# Patient Record
Sex: Female | Born: 1945
Health system: Southern US, Community
[De-identification: ages and names within clinical notes are randomized; demographics above are authoritative.]

## PROBLEM LIST (undated history)

## (undated) ENCOUNTER — Telehealth

## (undated) ENCOUNTER — Encounter

## (undated) ENCOUNTER — Telehealth
Attending: Pharmacist Clinician (PhC)/ Clinical Pharmacy Specialist | Primary: Pharmacist Clinician (PhC)/ Clinical Pharmacy Specialist

## (undated) ENCOUNTER — Encounter: Attending: Gastroenterology | Primary: Gastroenterology

## (undated) ENCOUNTER — Ambulatory Visit: Payer: MEDICARE

## (undated) ENCOUNTER — Encounter: Payer: MEDICARE | Attending: Gastroenterology | Primary: Gastroenterology

## (undated) ENCOUNTER — Ambulatory Visit: Payer: MEDICARE | Attending: Gastroenterology | Primary: Gastroenterology

## (undated) ENCOUNTER — Ambulatory Visit: Attending: Family Medicine | Primary: Family Medicine

## (undated) ENCOUNTER — Encounter
Attending: Pharmacist Clinician (PhC)/ Clinical Pharmacy Specialist | Primary: Pharmacist Clinician (PhC)/ Clinical Pharmacy Specialist

## (undated) ENCOUNTER — Ambulatory Visit

## (undated) DIAGNOSIS — R05 Cough: Secondary | ICD-10-CM

## (undated) DIAGNOSIS — R053 Chronic cough: Secondary | ICD-10-CM

## (undated) DIAGNOSIS — I10 Essential (primary) hypertension: Secondary | ICD-10-CM

## (undated) DIAGNOSIS — K219 Gastro-esophageal reflux disease without esophagitis: Secondary | ICD-10-CM

## (undated) DIAGNOSIS — M5030 Other cervical disc degeneration, unspecified cervical region: Secondary | ICD-10-CM

## (undated) DIAGNOSIS — R519 Headache, unspecified: Secondary | ICD-10-CM

## (undated) DIAGNOSIS — I071 Rheumatic tricuspid insufficiency: Secondary | ICD-10-CM

## (undated) DIAGNOSIS — R51 Headache: Secondary | ICD-10-CM

## (undated) DIAGNOSIS — K7460 Unspecified cirrhosis of liver: Secondary | ICD-10-CM

## (undated) DIAGNOSIS — F419 Anxiety disorder, unspecified: Secondary | ICD-10-CM

## (undated) DIAGNOSIS — F32A Depression, unspecified: Secondary | ICD-10-CM

## (undated) DIAGNOSIS — F329 Major depressive disorder, single episode, unspecified: Secondary | ICD-10-CM

## (undated) DIAGNOSIS — D649 Anemia, unspecified: Secondary | ICD-10-CM

## (undated) DIAGNOSIS — I219 Acute myocardial infarction, unspecified: Secondary | ICD-10-CM

## (undated) DIAGNOSIS — I779 Disorder of arteries and arterioles, unspecified: Secondary | ICD-10-CM

## (undated) DIAGNOSIS — IMO0001 Reserved for inherently not codable concepts without codable children: Secondary | ICD-10-CM

## (undated) DIAGNOSIS — T7840XA Allergy, unspecified, initial encounter: Secondary | ICD-10-CM

## (undated) DIAGNOSIS — I2510 Atherosclerotic heart disease of native coronary artery without angina pectoris: Secondary | ICD-10-CM

## (undated) DIAGNOSIS — I739 Peripheral vascular disease, unspecified: Secondary | ICD-10-CM

## (undated) HISTORY — PX: EYE SURGERY: SHX253

## (undated) HISTORY — PX: OTHER SURGICAL HISTORY: SHX169

## (undated) HISTORY — PX: BREAST CYST ASPIRATION: SHX578

## (undated) HISTORY — DX: Allergy, unspecified, initial encounter: T78.40XA

## (undated) HISTORY — PX: ABDOMINAL HYSTERECTOMY: SHX81

## (undated) HISTORY — PX: CARDIAC CATHETERIZATION: SHX172

## (undated) MED ORDER — ALENDRONATE 70 MG TABLET: ORAL | 0 days

---

## 2011-12-04 DIAGNOSIS — I214 Non-ST elevation (NSTEMI) myocardial infarction: Secondary | ICD-10-CM | POA: Insufficient documentation

## 2011-12-04 DIAGNOSIS — R739 Hyperglycemia, unspecified: Secondary | ICD-10-CM | POA: Insufficient documentation

## 2011-12-08 MED ORDER — NITROGLYCERIN 0.4 MG SUBLINGUAL TABLET
SUBLINGUAL | 0.00000 days | PRN
Start: 2011-12-08 — End: ?

## 2011-12-08 MED ORDER — FAMOTIDINE 20 MG TABLET
ORAL | 0.00000 days
Start: 2011-12-08 — End: ?

## 2012-02-04 ENCOUNTER — Emergency Department: Payer: Self-pay

## 2012-02-04 LAB — CK TOTAL AND CKMB (NOT AT ARMC)
CK, Total: 71 U/L (ref 21–215)
CK-MB: 0.7 ng/mL (ref 0.5–3.6)

## 2012-02-04 LAB — TROPONIN I: Troponin-I: <0.02 ng/mL

## 2012-02-04 LAB — BASIC METABOLIC PANEL
BUN: 9 mg/dL (ref 7–18)
Calcium, Total: 8.6 mg/dL (ref 8.5–10.1)
Co2: 25 mmol/L (ref 21–32)
Sodium: 139 mmol/L (ref 136–145)

## 2012-02-04 LAB — CBC
HCT: 29.8 % — ABNORMAL LOW (ref 35.0–47.0)
HGB: 10.3 g/dL — ABNORMAL LOW (ref 12.0–16.0)
MCH: 29.2 pg (ref 26.0–34.0)
MCHC: 34.7 g/dL (ref 32.0–36.0)
MCV: 84 fL (ref 80–100)
Platelet: 157 10*3/uL (ref 150–440)
RDW: 14.3 % (ref 11.5–14.5)

## 2012-03-04 ENCOUNTER — Ambulatory Visit: Payer: Self-pay | Admitting: Internal Medicine

## 2013-04-11 ENCOUNTER — Emergency Department: Payer: Self-pay | Admitting: Emergency Medicine

## 2013-04-11 LAB — CBC
HCT: 33.8 % — ABNORMAL LOW (ref 35.0–47.0)
HGB: 11.1 g/dL — AB (ref 12.0–16.0)
MCH: 27.6 pg (ref 26.0–34.0)
MCHC: 32.8 g/dL (ref 32.0–36.0)
MCV: 84 fL (ref 80–100)
Platelet: 139 10*3/uL — ABNORMAL LOW (ref 150–440)
RBC: 4.02 10*6/uL (ref 3.80–5.20)
RDW: 14.4 % (ref 11.5–14.5)
WBC: 8.6 10*3/uL (ref 3.6–11.0)

## 2013-04-11 LAB — BASIC METABOLIC PANEL
Anion Gap: 6 — ABNORMAL LOW (ref 7–16)
BUN: 6 mg/dL — ABNORMAL LOW (ref 7–18)
CALCIUM: 8.6 mg/dL (ref 8.5–10.1)
Chloride: 110 mmol/L — ABNORMAL HIGH (ref 98–107)
Co2: 23 mmol/L (ref 21–32)
Creatinine: 0.69 mg/dL (ref 0.60–1.30)
EGFR (African American): >60
EGFR (Non-African Amer.): >60
Glucose: 93 mg/dL (ref 65–99)
Osmolality: 275 (ref 275–301)
Potassium: 3.5 mmol/L (ref 3.5–5.1)
Sodium: 139 mmol/L (ref 136–145)

## 2013-04-11 LAB — TROPONIN I: Troponin-I: <0.02 ng/mL

## 2013-05-08 ENCOUNTER — Ambulatory Visit: Payer: Self-pay | Admitting: Orthopedic Surgery

## 2013-05-20 DIAGNOSIS — I2510 Atherosclerotic heart disease of native coronary artery without angina pectoris: Secondary | ICD-10-CM | POA: Insufficient documentation

## 2013-05-20 DIAGNOSIS — I10 Essential (primary) hypertension: Secondary | ICD-10-CM | POA: Insufficient documentation

## 2013-05-20 DIAGNOSIS — E782 Mixed hyperlipidemia: Secondary | ICD-10-CM | POA: Insufficient documentation

## 2013-06-17 ENCOUNTER — Ambulatory Visit: Payer: Self-pay | Admitting: Internal Medicine

## 2013-06-25 ENCOUNTER — Ambulatory Visit: Payer: Self-pay | Admitting: Internal Medicine

## 2013-06-25 LAB — CANCER CENTER HEMOGLOBIN: HGB: 7.9 g/dL — ABNORMAL LOW (ref 12.0–16.0)

## 2013-07-02 LAB — CANCER CENTER HEMOGLOBIN: HGB: 8.6 g/dL — AB (ref 12.0–16.0)

## 2013-07-03 ENCOUNTER — Ambulatory Visit: Payer: Self-pay | Admitting: Internal Medicine

## 2013-07-08 LAB — CANCER CENTER HEMOGLOBIN: HGB: 8.7 g/dL — AB (ref 12.0–16.0)

## 2013-07-10 ENCOUNTER — Ambulatory Visit: Payer: Self-pay | Admitting: Gastroenterology

## 2013-07-13 LAB — PATHOLOGY REPORT

## 2013-07-20 LAB — CBC CANCER CENTER
BASOS ABS: 0 x10 3/mm (ref 0.0–0.1)
Basophil %: 0.5 %
Eosinophil #: 0.2 x10 3/mm (ref 0.0–0.7)
Eosinophil %: 2.6 %
HCT: 29.2 % — ABNORMAL LOW (ref 35.0–47.0)
HGB: 9.2 g/dL — ABNORMAL LOW (ref 12.0–16.0)
LYMPHS ABS: 2.7 x10 3/mm (ref 1.0–3.6)
Lymphocyte %: 41.4 %
MCH: 24.8 pg — AB (ref 26.0–34.0)
MCHC: 31.6 g/dL — AB (ref 32.0–36.0)
MCV: 79 fL — ABNORMAL LOW (ref 80–100)
MONO ABS: 0.5 x10 3/mm (ref 0.2–0.9)
Monocyte %: 8.2 %
Neutrophil #: 3 x10 3/mm (ref 1.4–6.5)
Neutrophil %: 47.3 %
PLATELETS: 138 x10 3/mm — AB (ref 150–440)
RBC: 3.72 10*6/uL — AB (ref 3.80–5.20)
RDW: 22.7 % — ABNORMAL HIGH (ref 11.5–14.5)
WBC: 6.4 x10 3/mm (ref 3.6–11.0)

## 2013-08-03 ENCOUNTER — Ambulatory Visit: Payer: Self-pay | Admitting: Internal Medicine

## 2013-09-18 ENCOUNTER — Ambulatory Visit: Payer: Self-pay | Admitting: Internal Medicine

## 2013-11-04 DIAGNOSIS — M5412 Radiculopathy, cervical region: Secondary | ICD-10-CM | POA: Insufficient documentation

## 2013-11-04 DIAGNOSIS — M4310 Spondylolisthesis, site unspecified: Secondary | ICD-10-CM | POA: Insufficient documentation

## 2013-11-04 DIAGNOSIS — M5030 Other cervical disc degeneration, unspecified cervical region: Secondary | ICD-10-CM | POA: Insufficient documentation

## 2013-12-07 ENCOUNTER — Ambulatory Visit: Payer: Self-pay | Admitting: Internal Medicine

## 2013-12-07 LAB — CBC CANCER CENTER
Basophil #: 0 x10 3/mm (ref 0.0–0.1)
Basophil: 2 %
EOS ABS: 0.3 x10 3/mm (ref 0.0–0.7)
Eosinophil: 3 %
HCT: 41.6 % (ref 35.0–47.0)
HGB: 13.2 g/dL (ref 12.0–16.0)
Lymphocytes: 34 %
MCH: 29.4 pg (ref 26.0–34.0)
MCHC: 31.9 g/dL — ABNORMAL LOW (ref 32.0–36.0)
MCV: 92 fL (ref 80–100)
Monocytes: 10 %
Platelet: 129 x10 3/mm — ABNORMAL LOW (ref 150–440)
RBC: 4.51 10*6/uL (ref 3.80–5.20)
RDW: 15.3 % — ABNORMAL HIGH (ref 11.5–14.5)
SEGMENTED NEUTROPHILS: 51 %
WBC: 11.6 x10 3/mm — ABNORMAL HIGH (ref 3.6–11.0)

## 2013-12-07 LAB — PROTIME-INR
INR: 1.1
Prothrombin Time: 13.6 secs (ref 11.5–14.7)

## 2013-12-07 LAB — RETICULOCYTES
Absolute Retic Count: 0.0888 10*6/uL (ref 0.019–0.186)
Reticulocyte: 1.97 % (ref 0.4–3.1)

## 2013-12-07 LAB — FOLATE: Folic Acid: 43.2 ng/mL (ref 3.1–100.0)

## 2013-12-07 LAB — IRON AND TIBC
IRON SATURATION: 23 %
Iron Bind.Cap.(Total): 379 ug/dL (ref 250–450)
Iron: 88 ug/dL (ref 50–170)
UNBOUND IRON-BIND. CAP.: 291 ug/dL

## 2013-12-07 LAB — LACTATE DEHYDROGENASE: LDH: 243 U/L (ref 81–246)

## 2013-12-07 LAB — APTT: ACTIVATED PTT: 32 s (ref 23.6–35.9)

## 2013-12-07 LAB — FIBRINOGEN: Fibrinogen: 365 mg/dL (ref 210–470)

## 2013-12-07 LAB — FERRITIN: Ferritin (ARMC): 99 ng/mL (ref 8–388)

## 2013-12-07 LAB — FIBRIN DEGRADATION PROD.(ARMC ONLY): Fibrin Degradation Prod.: <10 ug/ml (ref 2.1–7.7)

## 2013-12-09 LAB — PROT IMMUNOELECTROPHORES(ARMC)

## 2013-12-09 LAB — URINE IEP, RANDOM

## 2013-12-29 ENCOUNTER — Ambulatory Visit: Payer: Self-pay | Admitting: Internal Medicine

## 2014-01-03 ENCOUNTER — Ambulatory Visit: Payer: Self-pay | Admitting: Internal Medicine

## 2014-01-08 ENCOUNTER — Ambulatory Visit: Payer: Self-pay | Admitting: Gastroenterology

## 2014-01-12 DIAGNOSIS — K7460 Unspecified cirrhosis of liver: Secondary | ICD-10-CM | POA: Insufficient documentation

## 2014-01-12 DIAGNOSIS — I6523 Occlusion and stenosis of bilateral carotid arteries: Secondary | ICD-10-CM | POA: Insufficient documentation

## 2014-01-12 DIAGNOSIS — R0600 Dyspnea, unspecified: Secondary | ICD-10-CM | POA: Insufficient documentation

## 2014-01-12 DIAGNOSIS — I6529 Occlusion and stenosis of unspecified carotid artery: Secondary | ICD-10-CM | POA: Insufficient documentation

## 2014-01-12 DIAGNOSIS — D509 Iron deficiency anemia, unspecified: Secondary | ICD-10-CM | POA: Insufficient documentation

## 2014-01-12 DIAGNOSIS — K769 Liver disease, unspecified: Secondary | ICD-10-CM | POA: Insufficient documentation

## 2014-01-12 DIAGNOSIS — R0681 Apnea, not elsewhere classified: Secondary | ICD-10-CM | POA: Insufficient documentation

## 2014-02-12 DIAGNOSIS — I071 Rheumatic tricuspid insufficiency: Secondary | ICD-10-CM | POA: Insufficient documentation

## 2014-03-09 ENCOUNTER — Ambulatory Visit: Payer: Self-pay | Admitting: Internal Medicine

## 2014-03-09 LAB — CBC CANCER CENTER
BASOS PCT: 0.5 %
Basophil #: 0 x10 3/mm (ref 0.0–0.1)
EOS ABS: 0.5 x10 3/mm (ref 0.0–0.7)
Eosinophil %: 5.7 %
HCT: 41.7 % (ref 35.0–47.0)
HGB: 13.9 g/dL (ref 12.0–16.0)
LYMPHS ABS: 3.1 x10 3/mm (ref 1.0–3.6)
Lymphocyte %: 34.2 %
MCH: 30.8 pg (ref 26.0–34.0)
MCHC: 33.4 g/dL (ref 32.0–36.0)
MCV: 92 fL (ref 80–100)
MONO ABS: 0.6 x10 3/mm (ref 0.2–0.9)
Monocyte %: 7 %
Neutrophil #: 4.8 x10 3/mm (ref 1.4–6.5)
Neutrophil %: 52.6 %
Platelet: 112 x10 3/mm — ABNORMAL LOW (ref 150–440)
RBC: 4.53 10*6/uL (ref 3.80–5.20)
RDW: 14.4 % (ref 11.5–14.5)
WBC: 9.1 x10 3/mm (ref 3.6–11.0)

## 2014-04-05 ENCOUNTER — Ambulatory Visit: Payer: Self-pay | Admitting: Internal Medicine

## 2014-04-29 ENCOUNTER — Other Ambulatory Visit: Payer: Self-pay | Admitting: Neurosurgery

## 2014-04-29 DIAGNOSIS — M5412 Radiculopathy, cervical region: Secondary | ICD-10-CM

## 2014-05-07 ENCOUNTER — Ambulatory Visit
Admission: RE | Admit: 2014-05-07 | Discharge: 2014-05-07 | Disposition: A | Discharge status: Home / Self Care | Payer: Medicare Other | Source: Ambulatory Visit | Attending: Neurosurgery | Admitting: Neurosurgery

## 2014-05-07 DIAGNOSIS — M5412 Radiculopathy, cervical region: Secondary | ICD-10-CM

## 2014-05-07 MED ORDER — IOHEXOL 300 MG/ML  SOLN
10.0000 mL | Freq: Once | INTRAMUSCULAR | Status: AC | PRN
  Administered 2014-05-07: 10 mL via INTRATHECAL

## 2014-05-07 MED ORDER — DIAZEPAM 5 MG PO TABS
5.0000 mg | ORAL_TABLET | Freq: Once | ORAL | Status: AC
Start: 2014-05-07 — End: 2014-05-07
  Administered 2014-05-07: 5 mg via ORAL

## 2014-05-07 NOTE — Discharge Instructions (Signed)

## 2014-05-14 ENCOUNTER — Ambulatory Visit: Payer: Self-pay | Admitting: Gastroenterology

## 2014-05-17 ENCOUNTER — Other Ambulatory Visit: Payer: Self-pay | Admitting: Neurosurgery

## 2014-05-21 ENCOUNTER — Encounter (HOSPITAL_COMMUNITY)
Admission: RE | Admit: 2014-05-21 | Discharge: 2014-05-21 | Disposition: A | Discharge status: Home / Self Care | Payer: Medicare Other | Source: Ambulatory Visit | Attending: Neurosurgery | Admitting: Neurosurgery

## 2014-05-21 ENCOUNTER — Encounter (HOSPITAL_COMMUNITY): Payer: Self-pay | Admitting: *Deleted

## 2014-05-21 DIAGNOSIS — Z01818 Encounter for other preprocedural examination: Secondary | ICD-10-CM

## 2014-05-21 LAB — CBC
HEMATOCRIT: 40.4 % (ref 36.0–46.0)
Hemoglobin: 14.1 g/dL (ref 12.0–15.0)
MCH: 32.3 pg (ref 26.0–34.0)
MCHC: 34.9 g/dL (ref 30.0–36.0)
MCV: 92.7 fL (ref 78.0–100.0)
Platelets: 108 10*3/uL — ABNORMAL LOW (ref 150–400)
RBC: 4.36 MIL/uL (ref 3.87–5.11)
RDW: 14 % (ref 11.5–15.5)
WBC: 8.4 10*3/uL (ref 4.0–10.5)

## 2014-05-21 LAB — COMPREHENSIVE METABOLIC PANEL
ALT: 37 U/L — ABNORMAL HIGH (ref 0–35)
AST: 76 U/L — AB (ref 0–37)
Albumin: 3.6 g/dL (ref 3.5–5.2)
Alkaline Phosphatase: 159 U/L — ABNORMAL HIGH (ref 39–117)
Anion gap: 11 (ref 5–15)
BUN: 7 mg/dL (ref 6–23)
CO2: 23 mmol/L (ref 19–32)
CREATININE: 0.63 mg/dL (ref 0.50–1.10)
Calcium: 9.3 mg/dL (ref 8.4–10.5)
Chloride: 108 mmol/L (ref 96–112)
GFR calc Af Amer: >90 mL/min (ref >90)
GFR, EST NON AFRICAN AMERICAN: 89 mL/min — AB (ref >90)
Glucose, Bld: 114 mg/dL — ABNORMAL HIGH (ref 70–99)
Potassium: 3.5 mmol/L (ref 3.5–5.1)
Sodium: 142 mmol/L (ref 135–145)
Total Bilirubin: 1.5 mg/dL — ABNORMAL HIGH (ref 0.3–1.2)
Total Protein: 7.1 g/dL (ref 6.0–8.3)

## 2014-05-21 LAB — SURGICAL PCR SCREEN
MRSA, PCR: NEGATIVE
Staphylococcus aureus: NEGATIVE

## 2014-05-21 NOTE — Pre-Procedure Instructions (Signed)
Shannon Sawyer  05/21/2014   Your procedure is scheduled on: Thursday  06/03/14  Report to [facility] Admitting at 530 AM.  Call this number if you have problems the morning of surgery: [phone]   Remember:   Do not eat food or drink liquids after midnight.   Take these medicines the morning of surgery with A SIP OF WATER:  OMEPRAZOLE (PRILOSEC), LORAZAPAM (ATIVAN) IF NEEDED  (STOP NAPROXEN/ ANAPROX, ASPIRIN, VITAMINS)   Do not wear jewelry, make-up or nail polish.  Do not wear lotions, powders, or perfumes. You may wear deodorant.  Do not shave 48 hours prior to surgery. Men may shave face and neck.  Do not bring valuables to the hospital.  [facility] is not responsible                  for any belongings or valuables.               Contacts, dentures or bridgework may not be worn into surgery.  Leave suitcase in the car. After surgery it may be brought to your room.  For patients admitted to the hospital, discharge time is determined by your                treatment team.               Patients discharged the day of surgery will not be allowed to drive  home.  Name and phone number of your driver:  Special Instructions:[name] - Preparing for Surgery  Before surgery, you can play an important role.  Because skin is not sterile, your skin needs to be as free of germs as possible.  You can reduce the number of germs on you skin by washing with CHG (chlorahexidine gluconate) soap before surgery.  CHG is an antiseptic cleaner which kills germs and bonds with the skin to continue killing germs even after washing.  Please DO NOT use if you have an allergy to CHG or antibacterial soaps.  If your skin becomes reddened/irritated stop using the CHG and inform your nurse when you arrive at Short Stay.  Do not shave (including legs and underarms) for at least 48 hours prior to the first CHG shower.  You may shave your face.  Please follow these instructions carefully:   1.   Shower with CHG Soap the night before surgery and the                                morning of Surgery.  2.  If you choose to wash your hair, wash your hair first as usual with your       normal shampoo.  3.  After you shampoo, rinse your hair and body thoroughly to remove the                      Shampoo.  4.  Use CHG as you would any other liquid soap.  You can apply chg directly       to the skin and wash gently with scrungie or a clean washcloth.  5.  Apply the CHG Soap to your body ONLY FROM THE NECK DOWN.        Do not use on open wounds or open sores.  Avoid contact with your eyes,       ears, mouth and genitals (private parts).  Wash genitals (private  parts)       with your normal soap.  6.  Wash thoroughly, paying special attention to the area where your surgery        will be performed.  7.  Thoroughly rinse your body with warm water from the neck down.  8.  DO NOT shower/wash with your normal soap after using and rinsing off       the CHG Soap.  9.  Pat yourself dry with a clean towel.            10.  Wear clean pajamas.            11.  Place clean sheets on your bed the night of your first shower and do not        sleep with pets.  Day of Surgery  Do not apply any lotions/deoderants the morning of surgery.  Please wear clean clothes to the hospital/surgery center.     Please read over the following fact sheets that you were given: Pain Booklet, Coughing and Deep Breathing, MRSA Information and Surgical Site Infection Prevention

## 2014-05-21 NOTE — Progress Notes (Signed)
REQUESTED STRESS TEST, CARDIAC CATH, EKG, OV, ? ECHO FROM DR. KOWOLSKI [phone])

## 2014-05-24 NOTE — Progress Notes (Signed)
Anesthesia Chart Review:  Pt is 69 year old female scheduled for ACDF C5-7 on 06/03/2014 with Dr. Hal Neer.   Cardiologist is Dr. Nehemiah Massed at [facility].   PMH includes: CAD, MI, HTN, carotid artery disease, tricuspid insufficiency, nonalcoholic cirrhosis, anemia, GERD. Former smoker. BMI 27.   Preoperative labs reviewed.  Platelets 108. AST 76, ALT 37, alk phos 159. Labs from 01/05/2014 in care everywhere were slightly lower but similar (AST 52, ALT normal, alk phos 128). These results are likely due to pt's cirrhosis. PT/PTT ordered for DOS.   Awaiting EKG from cardiologist's office.   Echo 02/09/2014: -moderate LV systolic dysfunction -normal RV ventricular systolic function -mild valvular regurgitation -no valvular stenosis  Exercise stress test 02/09/2014: -normal treadmill EKG without evidence of ischemia or any arrhythmia -normal myocardial perfusion without evidence of myocardial ischemia  Awaiting cardiac cath report from 2013 from [facility].

## 2014-05-28 ENCOUNTER — Encounter (HOSPITAL_COMMUNITY): Payer: Self-pay

## 2014-05-28 NOTE — Progress Notes (Signed)
Anesthesia Chart Review:  Patient is a 69 year old female scheduled for C5-6, C6-7 ACDF on 06/03/14 by Dr. Hal Neer.  History includes former smoker, non-alcoholic cirrhosis (likely NAFLD), CAD/MI s/p LAD stents X 2 12/2011 [facility]; records not available), moderate TR, exertional dyspnea, anemia and thrombocytopenia (Dr. Ma Hillock at [facility]), anxiety, depression, GERD, HTN, carotid artery stenosis, occular migraines, hysterectomy. PCP is listed as Dr. Lisette Grinder. GI is Dr. Arther Dames [facility]). Notes indicate that recent liver U/S at [facility] on 05/14/14 was "...consistent with known cirrhosis, otherwise negative." Cardiologist is Dr. Serafina Royals [facility]; see Care Everywhere), last visit 05/25/14 and stated, "The patient is at lowest risk possible for risk reduction in cardiovascular complications with surgical intervention and/or invasive procedure. Currently has no evidence active and/or significant angina and/or congestive heart failure. The patient may discontinue aspirin prior to procedure and restart it is safe period thereafter."  Meds include ASA, lactulose, Ativan, Mevacor, Nitro, Prilosec, Zofran.     02/09/14 Echo [facility]):  MODERATE LV SYSTOLIC DYSFUNCTION (See above)--this is what is stated on the echo interpretation; however, LV contraction is listed as normal with closest estimated EF > 55% with normal anterior, lateral, septal, apical, inferior, and posterior wall motion. Grade 1 diastolic dysfunction.    LA/RA MODERATELY ENLARGED.  NORMAL RIGHT VENTRICULAR SYSTOLIC FUNCTION MILD VALVULAR REGURGITATION (Trivial AR, Mild MR, Moderate TR, Mild PR). NO VALVULAR STENOSIS.  02/09/14 Exercise nuclear study: Normal treadmill EKG without evidence of ischemia or any arrhythmia. Normal myocardial perfusion without evidence of myocardial ischemia. LVEF 73%.   02/09/14 resting ECG is documented as NSR. Tracing has not yet been received.  If none received then we will need to get a baseline  EKG tracing on the day of surgery.   I did not receive a copy of her 2013 cardiac cath from [facility]  It was done in [place].  No signed release for medical records, so I cannot request a copy.  02/09/14 carotid duplex [facility]): 50-69% RICA stenosis, Peak systolic velocities have slightly increased since the prior study. Less than 97% LICA stenosis.  Preoperative labs noted.  ALK PH 159, AST 76, ALT 37, PLT 108K. H/H 14.1/40.4.  (prior AST was 52, ALT 23, ALK PHOS 128, PLT 122 on 01/05/14 in [place]). Patient has known cirrhosis and thrombocytopenia which is being followed by hematology and GI.  Will get PT/PTT on the day of surgery for baseline.   George Hugh [facility] Short Stay Center/Anesthesiology Phone [phone] 05/28/2014 10:58 AM

## 2014-06-01 ENCOUNTER — Ambulatory Visit
Admit: 2014-06-01 | Disposition: A | Discharge status: Home / Self Care | Payer: Self-pay | Attending: Internal Medicine | Admitting: Internal Medicine

## 2014-06-01 LAB — CBC CANCER CENTER
Basophil #: 0.1 x10 3/mm (ref 0.0–0.1)
Basophil %: 0.8 %
EOS ABS: 1.7 x10 3/mm — AB (ref 0.0–0.7)
Eosinophil %: 14.9 %
HCT: 40.4 % (ref 35.0–47.0)
HGB: 13.8 g/dL (ref 12.0–16.0)
Lymphocyte #: 3.9 x10 3/mm — ABNORMAL HIGH (ref 1.0–3.6)
Lymphocyte %: 34.2 %
MCH: 31.8 pg (ref 26.0–34.0)
MCHC: 34.1 g/dL (ref 32.0–36.0)
MCV: 93 fL (ref 80–100)
Monocyte #: 0.7 x10 3/mm (ref 0.2–0.9)
Monocyte %: 6.3 %
NEUTROS ABS: 4.9 x10 3/mm (ref 1.4–6.5)
Neutrophil %: 43.8 %
Platelet: 103 x10 3/mm — ABNORMAL LOW (ref 150–440)
RBC: 4.32 10*6/uL (ref 3.80–5.20)
RDW: 13.7 % (ref 11.5–14.5)
WBC: 11.3 x10 3/mm — ABNORMAL HIGH (ref 3.6–11.0)

## 2014-06-01 LAB — IRON AND TIBC
IRON SATURATION: 20.8
Iron Bind.Cap.(Total): 317 (ref 250–450)
Iron: 66 ug/dL
Unbound Iron-Bind.Cap.: 251.4

## 2014-06-01 LAB — FERRITIN: FERRITIN (ARMC): 160 ng/mL

## 2014-06-02 MED ORDER — CEFAZOLIN SODIUM-DEXTROSE 2-3 GM-% IV SOLR
2.0000 g | INTRAVENOUS | Status: AC
  Administered 2014-06-03: 2 g via INTRAVENOUS
  Filled 2014-06-02: qty 50

## 2014-06-02 NOTE — Anesthesia Preprocedure Evaluation (Addendum)
Anesthesia Evaluation  Patient identified by MRN, date of birth, ID band Patient awake    Reviewed: Allergy & Precautions, NPO status , Patient's Chart, lab work & pertinent test results  Airway Mallampati: II   Neck ROM: Full    Dental  (+) Dental Advisory Given   Pulmonary shortness of breath, former smoker,  breath sounds clear to auscultation        Cardiovascular hypertension, Pt. on medications Rhythm:Regular  Cleared by cardiologist, ECHO 02/2014 EF 55% with no wall motion abnormalities, Normal Stress 2015, has mod TR and minimal AR, STENTS x 2 2013 to DC asprin for surgery   Neuro/Psych Anxiety Depression    GI/Hepatic GERD-  Medicated,(+) Cirrhosis -      , NON ETOH Chirrosis, LVT mildly elevated   Endo/Other    Renal/GU      Musculoskeletal   Abdominal (+)  Abdomen: soft.    Peds  Hematology   Anesthesia Other Findings   Reproductive/Obstetrics                           Anesthesia Physical Anesthesia Plan  ASA: III  Anesthesia Plan: General   Post-op Pain Management:    Induction: Intravenous  Airway Management Planned: Oral ETT  Additional Equipment:   Intra-op Plan:   Post-operative Plan: Extubation in OR  Informed Consent: I have reviewed the patients History and Physical, chart, labs and discussed the procedure including the risks, benefits and alternatives for the proposed anesthesia with the patient or authorized representative who has indicated his/her understanding and acceptance.     Plan Discussed with:   Anesthesia Plan Comments: (Multimodal pain RX, will order EKG)        Anesthesia Quick Evaluation

## 2014-06-03 ENCOUNTER — Inpatient Hospital Stay (HOSPITAL_COMMUNITY)
Admission: RE | Admit: 2014-06-03 | Discharge: 2014-06-04 | DRG: 473 | Disposition: A | Discharge status: Home / Self Care | Payer: Medicare Other | Source: Ambulatory Visit | Attending: Neurosurgery | Admitting: Neurosurgery

## 2014-06-03 ENCOUNTER — Encounter (HOSPITAL_COMMUNITY)
Admission: RE | Disposition: A | Discharge status: Home / Self Care | Payer: Self-pay | Source: Ambulatory Visit | Attending: Neurosurgery

## 2014-06-03 ENCOUNTER — Inpatient Hospital Stay (HOSPITAL_COMMUNITY): Payer: Medicare Other | Admitting: Anesthesiology

## 2014-06-03 ENCOUNTER — Encounter (HOSPITAL_COMMUNITY): Payer: Self-pay | Admitting: *Deleted

## 2014-06-03 ENCOUNTER — Inpatient Hospital Stay (HOSPITAL_COMMUNITY): Payer: Medicare Other

## 2014-06-03 ENCOUNTER — Inpatient Hospital Stay (HOSPITAL_COMMUNITY): Payer: Medicare Other | Admitting: Emergency Medicine

## 2014-06-03 DIAGNOSIS — M4722 Other spondylosis with radiculopathy, cervical region: Secondary | ICD-10-CM | POA: Diagnosis present

## 2014-06-03 DIAGNOSIS — K7460 Unspecified cirrhosis of liver: Secondary | ICD-10-CM | POA: Diagnosis present

## 2014-06-03 DIAGNOSIS — Z885 Allergy status to narcotic agent status: Secondary | ICD-10-CM | POA: Diagnosis not present

## 2014-06-03 DIAGNOSIS — I6523 Occlusion and stenosis of bilateral carotid arteries: Secondary | ICD-10-CM | POA: Diagnosis present

## 2014-06-03 DIAGNOSIS — G43809 Other migraine, not intractable, without status migrainosus: Secondary | ICD-10-CM | POA: Diagnosis present

## 2014-06-03 DIAGNOSIS — M4312 Spondylolisthesis, cervical region: Secondary | ICD-10-CM | POA: Diagnosis present

## 2014-06-03 DIAGNOSIS — I071 Rheumatic tricuspid insufficiency: Secondary | ICD-10-CM | POA: Diagnosis present

## 2014-06-03 DIAGNOSIS — Z90710 Acquired absence of both cervix and uterus: Secondary | ICD-10-CM

## 2014-06-03 DIAGNOSIS — I2510 Atherosclerotic heart disease of native coronary artery without angina pectoris: Secondary | ICD-10-CM | POA: Diagnosis present

## 2014-06-03 DIAGNOSIS — I252 Old myocardial infarction: Secondary | ICD-10-CM | POA: Diagnosis not present

## 2014-06-03 DIAGNOSIS — Z79899 Other long term (current) drug therapy: Secondary | ICD-10-CM

## 2014-06-03 DIAGNOSIS — F329 Major depressive disorder, single episode, unspecified: Secondary | ICD-10-CM | POA: Diagnosis present

## 2014-06-03 DIAGNOSIS — Z888 Allergy status to other drugs, medicaments and biological substances status: Secondary | ICD-10-CM | POA: Diagnosis not present

## 2014-06-03 DIAGNOSIS — Z91013 Allergy to seafood: Secondary | ICD-10-CM

## 2014-06-03 DIAGNOSIS — Z7982 Long term (current) use of aspirin: Secondary | ICD-10-CM | POA: Diagnosis not present

## 2014-06-03 DIAGNOSIS — K219 Gastro-esophageal reflux disease without esophagitis: Secondary | ICD-10-CM | POA: Diagnosis present

## 2014-06-03 DIAGNOSIS — Z87891 Personal history of nicotine dependence: Secondary | ICD-10-CM

## 2014-06-03 DIAGNOSIS — M4802 Spinal stenosis, cervical region: Secondary | ICD-10-CM

## 2014-06-03 DIAGNOSIS — M5412 Radiculopathy, cervical region: Secondary | ICD-10-CM | POA: Diagnosis present

## 2014-06-03 DIAGNOSIS — H9190 Unspecified hearing loss, unspecified ear: Secondary | ICD-10-CM | POA: Diagnosis present

## 2014-06-03 DIAGNOSIS — I10 Essential (primary) hypertension: Secondary | ICD-10-CM | POA: Diagnosis present

## 2014-06-03 DIAGNOSIS — M5012 Cervical disc disorder with radiculopathy, mid-cervical region: Secondary | ICD-10-CM | POA: Diagnosis present

## 2014-06-03 DIAGNOSIS — F419 Anxiety disorder, unspecified: Secondary | ICD-10-CM | POA: Diagnosis present

## 2014-06-03 DIAGNOSIS — D649 Anemia, unspecified: Secondary | ICD-10-CM | POA: Diagnosis present

## 2014-06-03 HISTORY — DX: Rheumatic tricuspid insufficiency: I07.1

## 2014-06-03 HISTORY — DX: Anxiety disorder, unspecified: F41.9

## 2014-06-03 HISTORY — DX: Depression, unspecified: F32.A

## 2014-06-03 HISTORY — DX: Chronic cough: R05.3

## 2014-06-03 HISTORY — DX: Atherosclerotic heart disease of native coronary artery without angina pectoris: I25.10

## 2014-06-03 HISTORY — DX: Essential (primary) hypertension: I10

## 2014-06-03 HISTORY — DX: Major depressive disorder, single episode, unspecified: F32.9

## 2014-06-03 HISTORY — PX: ANTERIOR CERVICAL DECOMP/DISCECTOMY FUSION: SHX1161

## 2014-06-03 HISTORY — DX: Unspecified cirrhosis of liver: K74.60

## 2014-06-03 HISTORY — DX: Other cervical disc degeneration, unspecified cervical region: M50.30

## 2014-06-03 HISTORY — DX: Anemia, unspecified: D64.9

## 2014-06-03 HISTORY — DX: Reserved for inherently not codable concepts without codable children: IMO0001

## 2014-06-03 HISTORY — DX: Headache, unspecified: R51.9

## 2014-06-03 HISTORY — DX: Disorder of arteries and arterioles, unspecified: I77.9

## 2014-06-03 HISTORY — DX: Cough: R05

## 2014-06-03 HISTORY — DX: Acute myocardial infarction, unspecified: I21.9

## 2014-06-03 HISTORY — DX: Headache: R51

## 2014-06-03 HISTORY — DX: Peripheral vascular disease, unspecified: I73.9

## 2014-06-03 HISTORY — DX: Gastro-esophageal reflux disease without esophagitis: K21.9

## 2014-06-03 LAB — APTT: aPTT: 29 seconds (ref 24–37)

## 2014-06-03 LAB — PROTIME-INR
INR: 1.13 (ref 0.00–1.49)
Prothrombin Time: 14.7 seconds (ref 11.6–15.2)

## 2014-06-03 SURGERY — ANTERIOR CERVICAL DECOMPRESSION/DISCECTOMY FUSION 2 LEVELS
Anesthesia: General | Site: Spine Cervical

## 2014-06-03 MED ORDER — HYDROCODONE-ACETAMINOPHEN 5-325 MG PO TABS
1.0000 | ORAL_TABLET | ORAL | Status: DC | PRN
  Administered 2014-06-03: 2 via ORAL
  Administered 2014-06-04: 1 via ORAL
  Filled 2014-06-03: qty 2
  Filled 2014-06-03: qty 1

## 2014-06-03 MED ORDER — KETOTIFEN FUMARATE 0.025 % OP SOLN: 1.0000 [drp] | Freq: Two times a day (BID) | OPHTHALMIC | Status: DC | PRN

## 2014-06-03 MED ORDER — ACETAMINOPHEN 325 MG PO TABS: 650.0000 mg | ORAL_TABLET | ORAL | Status: DC | PRN

## 2014-06-03 MED ORDER — STERILE WATER FOR INJECTION IJ SOLN
INTRAMUSCULAR | Status: AC
  Filled 2014-06-03: qty 10

## 2014-06-03 MED ORDER — SODIUM CHLORIDE 0.9 % IV SOLN: 250.0000 mL | INTRAVENOUS | Status: DC

## 2014-06-03 MED ORDER — LIDOCAINE HCL (CARDIAC) 20 MG/ML IV SOLN
INTRAVENOUS | Status: DC | PRN
  Administered 2014-06-03: 100 mg via INTRAVENOUS

## 2014-06-03 MED ORDER — SENNOSIDES-DOCUSATE SODIUM 8.6-50 MG PO TABS
1.0000 | ORAL_TABLET | Freq: Every evening | ORAL | Status: DC | PRN
  Filled 2014-06-03: qty 1

## 2014-06-03 MED ORDER — DIPHENHYDRAMINE HCL 50 MG/ML IJ SOLN
INTRAMUSCULAR | Status: AC
  Filled 2014-06-03: qty 1

## 2014-06-03 MED ORDER — ROCURONIUM BROMIDE 100 MG/10ML IV SOLN
INTRAVENOUS | Status: DC | PRN
  Administered 2014-06-03: 50 mg via INTRAVENOUS

## 2014-06-03 MED ORDER — GLYCOPYRROLATE 0.2 MG/ML IJ SOLN
INTRAMUSCULAR | Status: AC
  Filled 2014-06-03: qty 4

## 2014-06-03 MED ORDER — VECURONIUM BROMIDE 10 MG IV SOLR
INTRAVENOUS | Status: DC | PRN
  Administered 2014-06-03: 2 mg via INTRAVENOUS

## 2014-06-03 MED ORDER — SODIUM CHLORIDE 0.9 % IJ SOLN
3.0000 mL | Freq: Two times a day (BID) | INTRAMUSCULAR | Status: DC
Start: 2014-06-03 — End: 2014-06-04
  Administered 2014-06-03: 3 mL via INTRAVENOUS

## 2014-06-03 MED ORDER — MEPERIDINE HCL 25 MG/ML IJ SOLN: 6.2500 mg | INTRAMUSCULAR | Status: DC | PRN

## 2014-06-03 MED ORDER — EPHEDRINE SULFATE 50 MG/ML IJ SOLN
INTRAMUSCULAR | Status: DC | PRN
  Administered 2014-06-03: 5 mg via INTRAVENOUS

## 2014-06-03 MED ORDER — METHOCARBAMOL 1000 MG/10ML IJ SOLN: 500.0000 mg | Freq: Four times a day (QID) | INTRAVENOUS | Status: DC | PRN

## 2014-06-03 MED ORDER — SODIUM CHLORIDE 0.9 % IJ SOLN: 3.0000 mL | INTRAMUSCULAR | Status: DC | PRN

## 2014-06-03 MED ORDER — MIDAZOLAM HCL 5 MG/5ML IJ SOLN
INTRAMUSCULAR | Status: DC | PRN
  Administered 2014-06-03: 2 mg via INTRAVENOUS

## 2014-06-03 MED ORDER — CEFAZOLIN SODIUM-DEXTROSE 2-3 GM-% IV SOLR
2.0000 g | Freq: Three times a day (TID) | INTRAVENOUS | Status: AC
  Administered 2014-06-03 (×2): 2 g via INTRAVENOUS
  Filled 2014-06-03 (×2): qty 50

## 2014-06-03 MED ORDER — SODIUM CHLORIDE 0.9 % IR SOLN
Status: DC | PRN
  Administered 2014-06-03: 09:00:00

## 2014-06-03 MED ORDER — ONDANSETRON HCL 4 MG/2ML IJ SOLN
INTRAMUSCULAR | Status: AC
  Filled 2014-06-03: qty 2

## 2014-06-03 MED ORDER — ACETAMINOPHEN 650 MG RE SUPP: 650.0000 mg | RECTAL | Status: DC | PRN

## 2014-06-03 MED ORDER — DEXAMETHASONE SODIUM PHOSPHATE 4 MG/ML IJ SOLN
INTRAMUSCULAR | Status: AC
  Filled 2014-06-03: qty 2

## 2014-06-03 MED ORDER — DEXAMETHASONE SODIUM PHOSPHATE 4 MG/ML IJ SOLN
INTRAMUSCULAR | Status: DC | PRN
  Administered 2014-06-03: 8 mg via INTRAVENOUS

## 2014-06-03 MED ORDER — PROPOFOL 10 MG/ML IV BOLUS
INTRAVENOUS | Status: AC
  Filled 2014-06-03: qty 20

## 2014-06-03 MED ORDER — FENTANYL CITRATE 0.05 MG/ML IJ SOLN
INTRAMUSCULAR | Status: AC
  Filled 2014-06-03: qty 5

## 2014-06-03 MED ORDER — VECURONIUM BROMIDE 10 MG IV SOLR
INTRAVENOUS | Status: AC
  Filled 2014-06-03: qty 10

## 2014-06-03 MED ORDER — METHOCARBAMOL 500 MG PO TABS: 500.0000 mg | ORAL_TABLET | Freq: Four times a day (QID) | ORAL | Status: DC | PRN

## 2014-06-03 MED ORDER — PHENOL 1.4 % MT LIQD
1.0000 | OROMUCOSAL | Status: DC | PRN
  Administered 2014-06-03: 1 via OROMUCOSAL
  Filled 2014-06-03: qty 177

## 2014-06-03 MED ORDER — NEOSTIGMINE METHYLSULFATE 10 MG/10ML IV SOLN
INTRAVENOUS | Status: DC | PRN
  Administered 2014-06-03: 4 mg via INTRAVENOUS

## 2014-06-03 MED ORDER — MIDAZOLAM HCL 2 MG/2ML IJ SOLN
INTRAMUSCULAR | Status: AC
  Filled 2014-06-03: qty 2

## 2014-06-03 MED ORDER — NEOSTIGMINE METHYLSULFATE 10 MG/10ML IV SOLN
INTRAVENOUS | Status: AC
  Filled 2014-06-03: qty 1

## 2014-06-03 MED ORDER — LACTATED RINGERS IV SOLN
INTRAVENOUS | Status: DC | PRN
  Administered 2014-06-03 (×2): via INTRAVENOUS

## 2014-06-03 MED ORDER — KCL IN DEXTROSE-NACL 20-5-0.45 MEQ/L-%-% IV SOLN
80.0000 mL/h | INTRAVENOUS | Status: DC
  Filled 2014-06-03 (×3): qty 1000

## 2014-06-03 MED ORDER — HEMOSTATIC AGENTS (NO CHARGE) OPTIME
TOPICAL | Status: DC | PRN
  Administered 2014-06-03: 1 via TOPICAL

## 2014-06-03 MED ORDER — SUCCINYLCHOLINE CHLORIDE 20 MG/ML IJ SOLN
INTRAMUSCULAR | Status: AC
  Filled 2014-06-03: qty 1

## 2014-06-03 MED ORDER — MENTHOL 3 MG MT LOZG
1.0000 | LOZENGE | OROMUCOSAL | Status: DC | PRN
  Filled 2014-06-03: qty 9

## 2014-06-03 MED ORDER — PANTOPRAZOLE SODIUM 40 MG IV SOLR
40.0000 mg | Freq: Every day | INTRAVENOUS | Status: DC
  Administered 2014-06-03: 40 mg via INTRAVENOUS
  Filled 2014-06-03 (×2): qty 40

## 2014-06-03 MED ORDER — FENTANYL CITRATE 0.05 MG/ML IJ SOLN
INTRAMUSCULAR | Status: DC | PRN
  Administered 2014-06-03 (×2): 50 ug via INTRAVENOUS
  Administered 2014-06-03: 100 ug via INTRAVENOUS
  Administered 2014-06-03: 50 ug via INTRAVENOUS

## 2014-06-03 MED ORDER — EPHEDRINE SULFATE 50 MG/ML IJ SOLN
INTRAMUSCULAR | Status: AC
  Filled 2014-06-03: qty 1

## 2014-06-03 MED ORDER — ARTIFICIAL TEARS OP OINT
TOPICAL_OINTMENT | OPHTHALMIC | Status: AC
  Filled 2014-06-03: qty 3.5

## 2014-06-03 MED ORDER — LIDOCAINE HCL (CARDIAC) 20 MG/ML IV SOLN
INTRAVENOUS | Status: AC
  Filled 2014-06-03: qty 10

## 2014-06-03 MED ORDER — ONDANSETRON HCL 4 MG/2ML IJ SOLN: 4.0000 mg | INTRAMUSCULAR | Status: DC | PRN

## 2014-06-03 MED ORDER — NITROGLYCERIN 0.4 MG SL SUBL: 0.4000 mg | SUBLINGUAL_TABLET | SUBLINGUAL | Status: DC | PRN

## 2014-06-03 MED ORDER — ONDANSETRON HCL 4 MG/2ML IJ SOLN
INTRAMUSCULAR | Status: DC | PRN
  Administered 2014-06-03: 4 mg via INTRAVENOUS

## 2014-06-03 MED ORDER — ROCURONIUM BROMIDE 50 MG/5ML IV SOLN
INTRAVENOUS | Status: AC
  Filled 2014-06-03: qty 1

## 2014-06-03 MED ORDER — ACETAMINOPHEN 10 MG/ML IV SOLN
INTRAVENOUS | Status: AC
  Administered 2014-06-03: 1000 mg via INTRAVENOUS
  Filled 2014-06-03: qty 100

## 2014-06-03 MED ORDER — GLYCOPYRROLATE 0.2 MG/ML IJ SOLN
INTRAMUSCULAR | Status: DC | PRN
  Administered 2014-06-03: 0.6 mg via INTRAVENOUS

## 2014-06-03 MED ORDER — FENTANYL CITRATE 0.05 MG/ML IJ SOLN: 25.0000 ug | INTRAMUSCULAR | Status: DC | PRN

## 2014-06-03 MED ORDER — DIPHENHYDRAMINE HCL 50 MG/ML IJ SOLN
INTRAMUSCULAR | Status: DC | PRN
  Administered 2014-06-03: 12.5 mg via INTRAVENOUS

## 2014-06-03 MED ORDER — PROPOFOL 10 MG/ML IV BOLUS
INTRAVENOUS | Status: DC | PRN
  Administered 2014-06-03: 160 mg via INTRAVENOUS

## 2014-06-03 MED ORDER — LORAZEPAM 0.5 MG PO TABS: 0.5000 mg | ORAL_TABLET | Freq: Three times a day (TID) | ORAL | Status: DC | PRN

## 2014-06-03 MED ORDER — BISACODYL 5 MG PO TBEC: 5.0000 mg | DELAYED_RELEASE_TABLET | Freq: Every day | ORAL | Status: DC | PRN

## 2014-06-03 MED ORDER — 0.9 % SODIUM CHLORIDE (POUR BTL) OPTIME
TOPICAL | Status: DC | PRN
  Administered 2014-06-03: 1000 mL

## 2014-06-03 MED ORDER — THROMBIN 5000 UNITS EX SOLR
CUTANEOUS | Status: DC | PRN
  Administered 2014-06-03 (×2): 5000 [IU] via TOPICAL

## 2014-06-03 MED ORDER — PROMETHAZINE HCL 25 MG/ML IJ SOLN: 6.2500 mg | INTRAMUSCULAR | Status: DC | PRN

## 2014-06-03 MED ORDER — THROMBIN 20000 UNITS EX SOLR
CUTANEOUS | Status: DC | PRN
  Administered 2014-06-03: 09:00:00 via TOPICAL

## 2014-06-03 MED ORDER — HYDROMORPHONE HCL 1 MG/ML IJ SOLN: 1.0000 mg | INTRAMUSCULAR | Status: DC | PRN

## 2014-06-03 MED ORDER — CEFAZOLIN SODIUM 1-5 GM-% IV SOLN
INTRAVENOUS | Status: AC
  Filled 2014-06-03: qty 50

## 2014-06-03 SURGICAL SUPPLY — 57 items
BAG DECANTER FOR FLEXI CONT (MISCELLANEOUS) ×2 IMPLANT
BENZOIN TINCTURE PRP APPL 2/3 (GAUZE/BANDAGES/DRESSINGS) ×2 IMPLANT
BRUSH SCRUB EZ PLAIN DRY (MISCELLANEOUS) ×2 IMPLANT
BUR MATCHSTICK NEURO 3.0 LAGG (BURR) ×2 IMPLANT
CANISTER SUCT 3000ML PPV (MISCELLANEOUS) ×2 IMPLANT
CAP CERV TRINICA SELECT SD L3 ×2 IMPLANT
CONT SPEC 4OZ CLIKSEAL STRL BL (MISCELLANEOUS) ×2 IMPLANT
DRAPE C-ARM 42X72 X-RAY (DRAPES) ×4 IMPLANT
DRAPE LAPAROTOMY 100X72 PEDS (DRAPES) ×2 IMPLANT
DRAPE MICROSCOPE LEICA (MISCELLANEOUS) ×2 IMPLANT
DRAPE SURG 17X23 STRL (DRAPES) ×4 IMPLANT
DRSG OPSITE POSTOP 3X4 (GAUZE/BANDAGES/DRESSINGS) ×2 IMPLANT
DRSG TELFA 3X8 NADH (GAUZE/BANDAGES/DRESSINGS) ×2 IMPLANT
DURAPREP 6ML APPLICATOR 50/CS (WOUND CARE) ×2 IMPLANT
ELECT COATED BLADE 2.86 ST (ELECTRODE) ×2 IMPLANT
ELECT REM PT RETURN 9FT ADLT (ELECTROSURGICAL) ×2
ELECTRODE REM PT RTRN 9FT ADLT (ELECTROSURGICAL) ×1 IMPLANT
GAUZE SPONGE 4X4 12PLY STRL (GAUZE/BANDAGES/DRESSINGS) ×2 IMPLANT
GAUZE SPONGE 4X4 16PLY XRAY LF (GAUZE/BANDAGES/DRESSINGS) IMPLANT
GLOVE BIO SURGEON STRL SZ7 (GLOVE) ×4 IMPLANT
GLOVE BIOGEL PI IND STRL 7.5 (GLOVE) ×2 IMPLANT
GLOVE BIOGEL PI INDICATOR 7.5 (GLOVE) ×2
GLOVE ECLIPSE 7.0 STRL STRAW (GLOVE) ×4 IMPLANT
GLOVE ECLIPSE 8.0 STRL XLNG CF (GLOVE) ×2 IMPLANT
GLOVE EXAM NITRILE LRG STRL (GLOVE) IMPLANT
GLOVE EXAM NITRILE MD LF STRL (GLOVE) IMPLANT
GLOVE EXAM NITRILE XL STR (GLOVE) IMPLANT
GLOVE EXAM NITRILE XS STR PU (GLOVE) IMPLANT
GOWN STRL REUS W/ TWL LRG LVL3 (GOWN DISPOSABLE) ×1 IMPLANT
GOWN STRL REUS W/ TWL XL LVL3 (GOWN DISPOSABLE) ×1 IMPLANT
GOWN STRL REUS W/TWL 2XL LVL3 (GOWN DISPOSABLE) ×2 IMPLANT
GOWN STRL REUS W/TWL LRG LVL3 (GOWN DISPOSABLE) ×1
GOWN STRL REUS W/TWL XL LVL3 (GOWN DISPOSABLE) ×1
HALTER HD/CHIN CERV TRACTION D (MISCELLANEOUS) ×2 IMPLANT
KIT BASIN OR (CUSTOM PROCEDURE TRAY) ×2 IMPLANT
KIT ROOM TURNOVER OR (KITS) ×2 IMPLANT
NEEDLE SPNL 20GX3.5 QUINCKE YW (NEEDLE) ×2 IMPLANT
NS IRRIG 1000ML POUR BTL (IV SOLUTION) ×2 IMPLANT
PACK LAMINECTOMY NEURO (CUSTOM PROCEDURE TRAY) ×2 IMPLANT
PAD ARMBOARD 7.5X6 YLW CONV (MISCELLANEOUS) ×2 IMPLANT
PATTIES SURGICAL .75X.75 (GAUZE/BANDAGES/DRESSINGS) ×2 IMPLANT
PLATE 38MM (Plate) ×2 IMPLANT
PUTTY BONE GRAFT KIT 2.5ML (Bone Implant) ×2 IMPLANT
RUBBERBAND STERILE (MISCELLANEOUS) ×4 IMPLANT
SCREW SELF DRILL VAR 12MM (Screw) ×12 IMPLANT
SPACER TM-S 11X14X5-7 (Spacer) ×2 IMPLANT
SPACER TMS 11X14X6MM (Spacer) ×2 IMPLANT
SPONGE INTESTINAL PEANUT (DISPOSABLE) ×2 IMPLANT
SPONGE SURGIFOAM ABS GEL SZ50 (HEMOSTASIS) ×2 IMPLANT
STRIP CLOSURE SKIN 1/2X4 (GAUZE/BANDAGES/DRESSINGS) ×2 IMPLANT
SUT PDS AB 5-0 P3 18 (SUTURE) IMPLANT
SUT VIC AB 3-0 CP2 18 (SUTURE) IMPLANT
SYR 20ML ECCENTRIC (SYRINGE) IMPLANT
TOWEL OR 17X24 6PK STRL BLUE (TOWEL DISPOSABLE) ×2 IMPLANT
TOWEL OR 17X26 10 PK STRL BLUE (TOWEL DISPOSABLE) ×2 IMPLANT
TRAP SPECIMEN MUCOUS 40CC (MISCELLANEOUS) IMPLANT
WATER STERILE IRR 1000ML POUR (IV SOLUTION) ×2 IMPLANT

## 2014-06-03 NOTE — Progress Notes (Signed)
Dr Tresa Moore called and informed of pt c/o dizziness. Pt states she thinks it is her nerves. Dr Tresa Moore also informed of pt drinking about 2oz of water this am.

## 2014-06-03 NOTE — Transfer of Care (Signed)
Immediate Anesthesia Transfer of Care Note  Patient: Shannon Sawyer  Procedure(s) Performed: Procedure(s): CERVICAL FIVE-CERVICAL SIX, CERVICAL SIX-CERVICAL SEVEN ANTERIOR CERVICAL DECOMPRESSION/DISCECTOMY  (N/A)  Patient Location: PACU  Anesthesia Type:General  Level of Consciousness: sedated and responds to stimulation  Airway & Oxygen Therapy: Patient Spontanous Breathing and Patient connected to nasal cannula oxygen  Post-op Assessment: Report given to RN, Post -op Vital signs reviewed and stable and Patient moving all extremities X 4  Post vital signs: Reviewed and stable  Last Vitals:  Filed Vitals:   06/03/14 0612  BP:   Pulse:   Temp: 36.4 C  Resp:     Complications: No apparent anesthesia complications

## 2014-06-03 NOTE — Anesthesia Postprocedure Evaluation (Signed)
  Anesthesia Post-op Note  Patient: Shannon Sawyer  Procedure(s) Performed: Procedure(s): CERVICAL FIVE-CERVICAL SIX, CERVICAL SIX-CERVICAL SEVEN ANTERIOR CERVICAL DECOMPRESSION/DISCECTOMY  (N/A)  Patient Location: PACU  Anesthesia Type:General  Level of Consciousness: awake and alert   Airway and Oxygen Therapy: Patient Spontanous Breathing and Patient connected to nasal cannula oxygen  Post-op Pain: mild  Post-op Assessment: Post-op Vital signs reviewed and Patient's Cardiovascular Status Stable  Post-op Vital Signs: Reviewed and stable  Last Vitals:  Filed Vitals:   06/03/14 0612  BP:   Pulse:   Temp: 36.4 C  Resp:     Complications: No apparent anesthesia complications

## 2014-06-03 NOTE — Progress Notes (Signed)
Utilization review completed.  

## 2014-06-03 NOTE — Anesthesia Procedure Notes (Signed)
Procedure Name: Intubation Date/Time: 06/03/2014 7:56 AM Performed by: Maryland Pink Pre-anesthesia Checklist: Patient identified, Emergency Drugs available, Suction available, Patient being monitored and Timeout performed Patient Re-evaluated:Patient Re-evaluated prior to inductionOxygen Delivery Method: Circle system utilized Preoxygenation: Pre-oxygenation with 100% oxygen Intubation Type: IV induction Ventilation: Mask ventilation without difficulty and Oral airway inserted - appropriate to patient size Laryngoscope Size: Glidescope (small adult) Grade View: Grade II Tube type: Oral Tube size: 7.0 mm Number of attempts: 1 Airway Equipment and Method: Stylet and Video-laryngoscopy Placement Confirmation: ETT inserted through vocal cords under direct vision,  positive ETCO2 and breath sounds checked- equal and bilateral Secured at: 21 cm Tube secured with: Tape Dental Injury: Teeth and Oropharynx as per pre-operative assessment  Comments: Soft bite block placed

## 2014-06-03 NOTE — H&P (Signed)
Shannon Sawyer is an 69 y.o. female.   Chief Complaint: Left arm pain HPI: The patient is a 69 year old female who is evaluated in the office for neck pain with radiation the are is much more so on the left than the right. She says that 90% of the problem is on the left. She's had this problem for years and use chiropractic treatments in the past which gave her mild relief only. She's been taking anti-inflammatory medications without relief. An MRI scan was done back in early 2015 and she came for evaluation in February of this year because her pain was increasing. After evaluation the office the patient underwent a myelogram with post myelographic CT. It showed spondylosis C5-6 and a disc abnormality at C6-7 on the left, symptomatically side. At this time the options were discussed the patient requested surgery and she now comes for a two-level anterior cervical discectomy with fusion and plating. I've had a long discussion with her regarding the risks and benefits of surgical intervention. The risks discussed include but are not limited to bleeding infection weakness numbness paralysis spinal fluid leak coma quadriplegia hoarseness and death. We have discussed alternative methods of therapy along the risks and benefits of nonintervention. With this information in hand she has requested that we proceed with surgery.  Past Medical History  Diagnosis Date  . Cirrhosis, nonalcoholic     PORTAL HTN, FATTY TISSUE   . Chronic cough   . Coronary artery disease   . Myocardial infarction   . DDD (degenerative disc disease), cervical     SPONDYLOLISTHESIS  . Carotid artery disease     BILAT.        Marland Kitchen Shortness of breath dyspnea     WITH EXERTION   . Tricuspid insufficiency     MODERATE   . Headache     HX OCCULAR MIGRAINES  . Anxiety   . Depression   . GERD (gastroesophageal reflux disease)   . Hypertension     ESSENTIAL  . Anemia     LOW PLATELET COUNTS  (DR [facility])    Past  Surgical History  Procedure Laterality Date  . Abdominal hysterectomy    . Cardiac catheterization      2 STENTS 2013 IN [place]    History reviewed. No pertinent family history. Social History:  reports that she has quit smoking. She does not have any smokeless tobacco history on file. She reports that she does not drink alcohol or use illicit drugs.  Allergies:  Allergies  Allergen Reactions  . Azelastine Swelling  . Flonase [Fluticasone Propionate] Swelling  . Prednisone Swelling  . Shellfish Allergy Swelling  . Tramadol Nausea Only    Medications Prior to Admission  Medication Sig Dispense Refill  . aspirin EC 81 MG tablet Take 81 mg by mouth daily.     . clobetasol cream (TEMOVATE) AB-123456789 % Apply 1 application topically 2 (two) times daily as needed (Dry skin).    Marland Kitchen desoximetasone (TOPICORT) 0.25 % cream Apply 1 application topically 2 (two) times daily as needed (Dry skin).    . FeFum-FePo-FA-B Cmp-C-Zn-Mn-Cu (PUREVIT DUALFE PLUS) 162-115.2-1 MG CAPS Take 1 tablet by mouth daily.     Marland Kitchen ketotifen (ZADITOR) 0.025 % ophthalmic solution Apply 1 drop to eye 2 (two) times daily as needed (For itchy eyes).    Marland Kitchen loratadine (CLARITIN) 10 MG tablet Take 10 mg by mouth daily as needed for allergies.    Marland Kitchen LORazepam (ATIVAN) 0.5 MG tablet 0.5  mg every 8 (eight) hours as needed for anxiety. TAKE ONE TABLET BY MOUTH EVERY 8 HOURS AS NEEDED FOR ANXIETY    . lovastatin (MEVACOR) 40 MG tablet Take 40 mg by mouth every evening. TAKE ONE TABLET BY MOUTH ONCE DAILY WITH DINNER    . naproxen sodium (ANAPROX) 220 MG tablet Take 220 mg by mouth 2 (two) times daily with a meal.     . omeprazole (PRILOSEC) 20 MG capsule Take 20 mg by mouth daily.     Marland Kitchen lactulose (CHRONULAC) 10 GM/15ML solution Take 20 g by mouth 2 (two) times daily.    . nitroGLYCERIN (NITROSTAT) 0.4 MG SL tablet Place 0.4 mg under the tongue every 5 (five) minutes as needed for chest pain.     Marland Kitchen ondansetron (ZOFRAN) 4 MG tablet Take  4 mg by mouth every 8 (eight) hours as needed for nausea.       No results found for this or any previous visit (from the past 48 hour(s)). No results found.  Positive for cirrhosis retinal hemorrhages hearing loss ringing in her ears nasal congestion shortness of breath abdominal pain anxiety depression and anemia  Blood pressure 165/61, pulse 90, temperature 97.6 F (36.4 C), temperature source Oral, resp. rate 18, height 5\' 2"  (1.575 m), weight 67.586 kg (149 lb), SpO2 100 %.  The patient is awake alert and oriented. She has no facial asymmetry. Her gait is nonantalgic. Reflexes are 1+ and equal. Sensation is intact. There is no weakness noted. Assessment/Plan Impression is that of spondylosis at C5-6 and a disc herniation at C6-7. The plan is for a two-level anterior cervical discectomy with fusion and plating.  Faythe Ghee, MD 06/03/2014, 7:36 AM

## 2014-06-03 NOTE — Op Note (Signed)
Preop diagnosis: Spondylosis C5-6 herniated disc C6-7 Postop diagnosis: Same Procedure: C5-6 C6-7 decompressive anterior cervical discectomy with trabecular metal interbody fusion and Trinica anterior cervical plating Surgeon: Doni Bacha Asst.: Nundkumar  After and placed the supine position and 10 pounds halter traction the patient's neck was prepped and draped in the usual sterile fashion. Localizing x-ray was taken prior to incision to identify the appropriate levels. Transverse incision was made in the right anterior neck started the midline and headed towards the medial aspect of the sternal cleidomastoid muscle. The platysma muscle was then incised transversely and the natural fascial plane between the strap muscles medially and the sternal cremaster laterally was identified and followed down to the anterior aspect of the cervical spine. The longus coli muscles were identified split the midline and Triple-A bilaterally with unipolar coagulation and Kitner dissection. Self-retaining tract was placed for exposure and x-ray showed approach the appropriate levels. Using a 15 blade the and used the disc at C5-6 and C6-7 was incised. Using pituitary rongeurs and curettes approximately 90% of disc material was removed. High-speed drill was used to widen the interspace and bony shavings were saved for use later in the case. At this time the microscope was draped brought in the field and used for the remainder of the case. Starting at C6-7 the remainder of the disc material down the posterior longitudinal ligament was removed. Ligament was then incised transversely and the cut edges removed a Kerrison punch. Thorough decompression was carried out along the spinal dura into the foramen bilaterally with removal of herniated disc material. The C7 nerve roots well visualized well decompressed bilaterally. Attention was then turned to C5-6 were similar decompression was carried out. At this level there was a lot of bony  overgrowth spondylosis. Thorough decompression was carried out on the spinal dura into the foramen bilaterally until the C6 nerve roots well visualized well decompressed bilaterally. At this time inspection was carried out once more in all directions at both levels for any evidence of residual compression and none could be identified. Irrigation was carried out and any bleeding control proper coag Gelfoam. Measurements were taken and one 5 mm one 6 mm trabecular metal lordotic graft was chosen and filled with a mixture of autologous bone and morselized allograft. The small graft was impacted at C5-6 and larger graft at C6-7 and fossae showed them to be in good position. An appropriately length Trinica anterior cervical plate was then chosen. 12 mm screws were placed without difficulty in a self drilling technique. The locking mechanism was rotated locked position and final fossae showed good position of the plates screws and plugs. Irrigation was carried out and any bleeding controlled with upper coagulation. The was then closed with inverted Vicryl on the platysma muscle and subcuticular layer. Steri-Strips were then applied a sterile dressing was applied. Soft collar was applied the patient was extubated and taken to recovery room in stable condition.

## 2014-06-04 ENCOUNTER — Ambulatory Visit
Admit: 2014-06-04 | Disposition: A | Discharge status: Home / Self Care | Payer: Self-pay | Attending: Internal Medicine | Admitting: Internal Medicine

## 2014-06-04 MED ORDER — HYDROCODONE-ACETAMINOPHEN 5-325 MG PO TABS: 1.0000 | ORAL_TABLET | ORAL | Status: DC | PRN

## 2014-06-04 NOTE — Discharge Summary (Signed)
Physician Discharge Summary  Patient ID: Shannon Sawyer MRN: [id number] DOB/AGE: Feb 13, 1946 69 y.o.  Admit date: 06/03/2014 Discharge date: 06/04/2014  Admission Diagnoses:  Discharge Diagnoses:  Active Problems:   Cervical radiculopathy   Discharged Condition: good  Hospital Course: Surgery yesterday for 2 level acdf. Did well with marked improvement in her arm pain. Ambulated well. No new neuro issues. Home pod 1, specific instructions given.  Consults: None  Significant Diagnostic Studies: none  Treatments: surgery: C 56 C 67 acdf  Discharge Exam: Blood pressure 135/70, pulse 82, temperature 98.3 F (36.8 C), temperature source Oral, resp. rate 18, height 5\' 2"  (1.575 m), weight 67.586 kg (149 lb), SpO2 95 %. Incision/Wound:clean and dry; no new weakness or numbness noted  Disposition: Final discharge disposition not confirmed     Medication List    ASK your doctor about these medications        aspirin EC 81 MG tablet  Take 81 mg by mouth daily.     clobetasol cream 0.05 %  Commonly known as:  TEMOVATE  Apply 1 application topically 2 (two) times daily as needed (Dry skin).     desoximetasone 0.25 % cream  Commonly known as:  TOPICORT  Apply 1 application topically 2 (two) times daily as needed (Dry skin).     ketotifen 0.025 % ophthalmic solution  Commonly known as:  ZADITOR  Apply 1 drop to eye 2 (two) times daily as needed (For itchy eyes).     lactulose 10 GM/15ML solution  Commonly known as:  CHRONULAC  Take 20 g by mouth 2 (two) times daily.     loratadine 10 MG tablet  Commonly known as:  CLARITIN  Take 10 mg by mouth daily as needed for allergies.     LORazepam 0.5 MG tablet  Commonly known as:  ATIVAN  0.5 mg every 8 (eight) hours as needed for anxiety. TAKE ONE TABLET BY MOUTH EVERY 8 HOURS AS NEEDED FOR ANXIETY     lovastatin 40 MG tablet  Commonly known as:  MEVACOR  Take 40 mg by mouth every evening. TAKE ONE TABLET BY MOUTH ONCE DAILY  WITH DINNER     naproxen sodium 220 MG tablet  Commonly known as:  ANAPROX  Take 220 mg by mouth 2 (two) times daily with a meal.     nitroGLYCERIN 0.4 MG SL tablet  Commonly known as:  NITROSTAT  Place 0.4 mg under the tongue every 5 (five) minutes as needed for chest pain.     omeprazole 20 MG capsule  Commonly known as:  PRILOSEC  Take 20 mg by mouth daily.     ondansetron 4 MG tablet  Commonly known as:  ZOFRAN  Take 4 mg by mouth every 8 (eight) hours as needed for nausea.     PUREVIT DUALFE PLUS 162-115.2-1 MG Caps  Take 1 tablet by mouth daily.         At home rest most of the time. Get up 9 or 10 times each day and take a 15 or 20 minute walk. No riding in the car and to your first postoperative appointment. If you have neck surgery you may shower from the chest down starting on the third postoperative day. If you had back surgery he may start showering on the third postoperative day with saran wrap wrapped around your incisional area 3 times. After the shower remove the saran wrap. Take pain medicine as needed and other medications as instructed. Call my office for an  appointment.  SignedFaythe Ghee, MD 06/04/2014, 8:13 AM

## 2014-06-04 NOTE — Progress Notes (Signed)
Pt doing well. Pt and husband given D/C instructions with Rx, verbal understanding was provided. Pt's incision is clean and dry with no sign of infection, its covered with Honeycomb dressing. Pt's IV was removed prior to D/C. Pt D/C'd home via wheelchair @ 0900 per MD order. Pt is stable @ D/C and has no other needs at this time. Holli Humbles, RN

## 2014-06-07 ENCOUNTER — Encounter (HOSPITAL_COMMUNITY): Payer: Self-pay | Admitting: Neurosurgery

## 2014-08-18 ENCOUNTER — Other Ambulatory Visit: Payer: Self-pay | Admitting: Internal Medicine

## 2014-08-18 DIAGNOSIS — R928 Other abnormal and inconclusive findings on diagnostic imaging of breast: Secondary | ICD-10-CM

## 2014-08-24 ENCOUNTER — Other Ambulatory Visit: Payer: Self-pay

## 2014-08-24 ENCOUNTER — Inpatient Hospital Stay: Payer: Medicare Other | Attending: Family Medicine

## 2014-08-24 DIAGNOSIS — D696 Thrombocytopenia, unspecified: Secondary | ICD-10-CM | POA: Diagnosis present

## 2014-08-24 DIAGNOSIS — D509 Iron deficiency anemia, unspecified: Secondary | ICD-10-CM | POA: Diagnosis not present

## 2014-08-24 LAB — CBC WITH DIFFERENTIAL/PLATELET
BASOS ABS: 0 10*3/uL (ref 0–0.1)
Basophils Relative: 0 %
EOS ABS: 0.2 10*3/uL (ref 0–0.7)
EOS PCT: 3 %
HEMATOCRIT: 40.3 % (ref 35.0–47.0)
Hemoglobin: 13.4 g/dL (ref 12.0–16.0)
LYMPHS ABS: 2.8 10*3/uL (ref 1.0–3.6)
Lymphocytes Relative: 40 %
MCH: 31.1 pg (ref 26.0–34.0)
MCHC: 33.2 g/dL (ref 32.0–36.0)
MCV: 93.7 fL (ref 80.0–100.0)
Monocytes Absolute: 0.5 10*3/uL (ref 0.2–0.9)
Monocytes Relative: 7 %
Neutro Abs: 3.5 10*3/uL (ref 1.4–6.5)
Neutrophils Relative %: 50 %
PLATELETS: 103 10*3/uL — AB (ref 150–440)
RBC: 4.3 MIL/uL (ref 3.80–5.20)
RDW: 13.6 % (ref 11.5–14.5)
WBC: 6.9 10*3/uL (ref 3.6–11.0)

## 2014-08-27 ENCOUNTER — Ambulatory Visit: Payer: Medicare Other

## 2014-08-27 ENCOUNTER — Ambulatory Visit
Admission: RE | Admit: 2014-08-27 | Discharge: 2014-08-27 | Disposition: A | Discharge status: Home / Self Care | Payer: Medicare Other | Source: Ambulatory Visit | Attending: Internal Medicine | Admitting: Internal Medicine

## 2014-08-27 DIAGNOSIS — R928 Other abnormal and inconclusive findings on diagnostic imaging of breast: Secondary | ICD-10-CM

## 2014-08-27 DIAGNOSIS — N63 Unspecified lump in breast: Secondary | ICD-10-CM | POA: Diagnosis not present

## 2014-08-30 ENCOUNTER — Other Ambulatory Visit: Payer: Self-pay | Admitting: Internal Medicine

## 2014-08-30 DIAGNOSIS — R928 Other abnormal and inconclusive findings on diagnostic imaging of breast: Secondary | ICD-10-CM

## 2014-08-30 DIAGNOSIS — N63 Unspecified lump in unspecified breast: Secondary | ICD-10-CM

## 2014-08-30 DIAGNOSIS — N6002 Solitary cyst of left breast: Secondary | ICD-10-CM

## 2014-09-02 ENCOUNTER — Other Ambulatory Visit: Payer: Self-pay | Admitting: Internal Medicine

## 2014-09-02 ENCOUNTER — Ambulatory Visit
Admission: RE | Admit: 2014-09-02 | Discharge: 2014-09-02 | Disposition: A | Discharge status: Home / Self Care | Payer: Medicare Other | Source: Ambulatory Visit | Attending: Internal Medicine | Admitting: Internal Medicine

## 2014-09-02 DIAGNOSIS — N6002 Solitary cyst of left breast: Secondary | ICD-10-CM

## 2014-09-02 DIAGNOSIS — N63 Unspecified lump in unspecified breast: Secondary | ICD-10-CM

## 2014-09-02 DIAGNOSIS — R928 Other abnormal and inconclusive findings on diagnostic imaging of breast: Secondary | ICD-10-CM

## 2014-09-02 DIAGNOSIS — N6092 Unspecified benign mammary dysplasia of left breast: Secondary | ICD-10-CM | POA: Diagnosis not present

## 2014-09-07 LAB — SURGICAL PATHOLOGY

## 2014-09-13 ENCOUNTER — Emergency Department: Payer: Medicare Other

## 2014-09-13 ENCOUNTER — Encounter: Payer: Self-pay | Admitting: Emergency Medicine

## 2014-09-13 ENCOUNTER — Emergency Department
Admission: EM | Admit: 2014-09-13 | Discharge: 2014-09-14 | Disposition: A | Discharge status: Home / Self Care | Payer: Medicare Other | Attending: Emergency Medicine | Admitting: Emergency Medicine

## 2014-09-13 ENCOUNTER — Other Ambulatory Visit: Payer: Self-pay

## 2014-09-13 DIAGNOSIS — Z79899 Other long term (current) drug therapy: Secondary | ICD-10-CM | POA: Diagnosis not present

## 2014-09-13 DIAGNOSIS — R109 Unspecified abdominal pain: Secondary | ICD-10-CM

## 2014-09-13 DIAGNOSIS — R945 Abnormal results of liver function studies: Secondary | ICD-10-CM

## 2014-09-13 DIAGNOSIS — R7989 Other specified abnormal findings of blood chemistry: Secondary | ICD-10-CM

## 2014-09-13 DIAGNOSIS — R1084 Generalized abdominal pain: Secondary | ICD-10-CM

## 2014-09-13 DIAGNOSIS — Z87891 Personal history of nicotine dependence: Secondary | ICD-10-CM | POA: Insufficient documentation

## 2014-09-13 DIAGNOSIS — I10 Essential (primary) hypertension: Secondary | ICD-10-CM | POA: Diagnosis not present

## 2014-09-13 DIAGNOSIS — K859 Acute pancreatitis, unspecified: Secondary | ICD-10-CM | POA: Diagnosis not present

## 2014-09-13 DIAGNOSIS — R1013 Epigastric pain: Secondary | ICD-10-CM | POA: Diagnosis present

## 2014-09-13 LAB — URINALYSIS COMPLETE WITH MICROSCOPIC (ARMC ONLY)
BILIRUBIN URINE: NEGATIVE
GLUCOSE, UA: NEGATIVE mg/dL
HGB URINE DIPSTICK: NEGATIVE
Ketones, ur: NEGATIVE mg/dL
Leukocytes, UA: NEGATIVE
NITRITE: NEGATIVE
Protein, ur: NEGATIVE mg/dL
SPECIFIC GRAVITY, URINE: 1.008 (ref 1.005–1.030)
pH: 7 (ref 5.0–8.0)

## 2014-09-13 LAB — HEPATIC FUNCTION PANEL
ALK PHOS: 141 U/L — AB (ref 38–126)
ALT: 26 U/L (ref 14–54)
AST: 65 U/L — ABNORMAL HIGH (ref 15–41)
Albumin: 3.5 g/dL (ref 3.5–5.0)
BILIRUBIN INDIRECT: 1.1 mg/dL — AB (ref 0.3–0.9)
BILIRUBIN TOTAL: 1.4 mg/dL — AB (ref 0.3–1.2)
Bilirubin, Direct: 0.3 mg/dL (ref 0.1–0.5)
TOTAL PROTEIN: 6.9 g/dL (ref 6.5–8.1)

## 2014-09-13 LAB — CBC
HCT: 41.7 % (ref 35.0–47.0)
Hemoglobin: 14.2 g/dL (ref 12.0–16.0)
MCH: 31.8 pg (ref 26.0–34.0)
MCHC: 34 g/dL (ref 32.0–36.0)
MCV: 93.5 fL (ref 80.0–100.0)
Platelets: 102 10*3/uL — ABNORMAL LOW (ref 150–440)
RBC: 4.46 MIL/uL (ref 3.80–5.20)
RDW: 13.8 % (ref 11.5–14.5)
WBC: 10.5 10*3/uL (ref 3.6–11.0)

## 2014-09-13 LAB — BASIC METABOLIC PANEL
ANION GAP: 8 (ref 5–15)
CHLORIDE: 107 mmol/L (ref 101–111)
CO2: 25 mmol/L (ref 22–32)
CREATININE: 0.64 mg/dL (ref 0.44–1.00)
Calcium: 9 mg/dL (ref 8.9–10.3)
GFR calc Af Amer: >60 mL/min (ref >60)
GLUCOSE: 104 mg/dL — AB (ref 65–99)
Potassium: 3.2 mmol/L — ABNORMAL LOW (ref 3.5–5.1)
Sodium: 140 mmol/L (ref 135–145)

## 2014-09-13 LAB — LIPASE, BLOOD: LIPASE: 57 U/L — AB (ref 22–51)

## 2014-09-13 LAB — TROPONIN I

## 2014-09-13 MED ORDER — FENTANYL CITRATE (PF) 100 MCG/2ML IJ SOLN
25.0000 ug | Freq: Once | INTRAMUSCULAR | Status: AC
  Administered 2014-09-13: 25 ug via INTRAVENOUS
  Filled 2014-09-13: qty 2

## 2014-09-13 MED ORDER — IOHEXOL 300 MG/ML  SOLN
100.0000 mL | Freq: Once | INTRAMUSCULAR | Status: AC | PRN
  Administered 2014-09-13: 100 mL via INTRAVENOUS

## 2014-09-13 MED ORDER — ONDANSETRON HCL 4 MG PO TABS: 4.0000 mg | ORAL_TABLET | Freq: Three times a day (TID) | ORAL | Status: DC | PRN

## 2014-09-13 MED ORDER — HYDROCODONE-ACETAMINOPHEN 5-325 MG PO TABS
1.0000 | ORAL_TABLET | Freq: Four times a day (QID) | ORAL | Status: DC | PRN
Start: 2014-09-13 — End: 2017-07-11

## 2014-09-13 MED ORDER — IOHEXOL 240 MG/ML SOLN
25.0000 mL | Freq: Once | INTRAMUSCULAR | Status: AC | PRN
  Administered 2014-09-13: 25 mL via INTRAVENOUS

## 2014-09-13 MED ORDER — ONDANSETRON HCL 4 MG/2ML IJ SOLN
4.0000 mg | Freq: Once | INTRAMUSCULAR | Status: AC
  Administered 2014-09-13: 4 mg via INTRAVENOUS
  Filled 2014-09-13: qty 2

## 2014-09-13 MED ORDER — GI COCKTAIL ~~LOC~~
30.0000 mL | Freq: Once | ORAL | Status: AC
  Administered 2014-09-13: 30 mL via ORAL
  Filled 2014-09-13: qty 30

## 2014-09-13 NOTE — ED Notes (Signed)
Developed upper abd pain which radiates into left chest this am  With some SOB

## 2014-09-13 NOTE — ED Provider Notes (Signed)
-----------------------------------------   11:08 PM on 09/13/2014 -----------------------------------------  Care was assumed from Dr. Reita Cliche pending CT of the abdomen and pelvis which still has not resulted. Care is transferred to Dr. Karma Greaser at this time.  Joanne Gavel, MD 09/13/14 [phone]

## 2014-09-13 NOTE — ED Provider Notes (Signed)
[facility] Emergency Department Provider Note   ____________________________________________  Time seen: 6:30 PM I have reviewed the triage vital signs and the triage nursing note.  HISTORY  Chief Complaint Abdominal Pain   Historian Patient and spouse  HPI Shannon Sawyer is a 69 y.o. female who woke up this morning experiencing epigastric pain radiating to the left upper quadrant a little bit into the central chest area. There is been no pleuritic chest pain. No shortness of breath. She is concerned about possible pancreatic problems, because her son died of pancreas cancer. She's had some nausea with no diarrhea or vomiting. No cramping. No fever. Symptoms are moderate. At worst was 8 out of 10, now is 1 out of 10.    Past Medical History  Diagnosis Date  . Cirrhosis, nonalcoholic     PORTAL HTN, FATTY TISSUE   . Chronic cough   . Coronary artery disease   . Myocardial infarction   . DDD (degenerative disc disease), cervical     SPONDYLOLISTHESIS  . Carotid artery disease     BILAT.        Marland Kitchen Shortness of breath dyspnea     WITH EXERTION   . Tricuspid insufficiency     MODERATE   . Headache     HX OCCULAR MIGRAINES  . Anxiety   . Depression   . GERD (gastroesophageal reflux disease)   . Hypertension     ESSENTIAL  . Anemia     LOW PLATELET COUNTS  (DR [facility])    Patient Active Problem List   Diagnosis Date Noted  . Cervical radiculopathy 06/03/2014  . TI (tricuspid incompetence) 02/12/2014  . Carotid artery narrowing 01/12/2014  . Cirrhosis of liver not due to alcohol 01/12/2014  . Anemia, iron deficiency 01/12/2014  . Disease of liver 01/12/2014  . Breathlessness on exertion 01/12/2014  . Cervical radiculitis 11/04/2013  . DDD (degenerative disc disease), cervical 11/04/2013  . SPL (spondylolisthesis) 11/04/2013  . Arteriosclerosis of coronary artery 05/20/2013  . Essential (primary) hypertension 05/20/2013  .  Combined fat and carbohydrate induced hyperlipemia 05/20/2013    Past Surgical History  Procedure Laterality Date  . Abdominal hysterectomy    . Cardiac catheterization      2 STENTS 2013 IN [place]  . Anterior cervical decomp/discectomy fusion N/A 06/03/2014    Procedure: CERVICAL FIVE-CERVICAL SIX, CERVICAL SIX-CERVICAL SEVEN ANTERIOR CERVICAL DECOMPRESSION/DISCECTOMY ;  Surgeon: Karie Chimera, MD;  Location: [place] [facility]URO ORS;  Service: Neurosurgery;  Laterality: N/A;  . Breast cyst aspiration Right     neg  . Cardiac stents      Current Outpatient Rx  Name  Route  Sig  Dispense  Refill  . clobetasol cream (TEMOVATE) 0.05 %   Topical   Apply 1 application topically 2 (two) times daily as needed (Dry skin).         Marland Kitchen desoximetasone (TOPICORT) 0.25 % cream   Topical   Apply 1 application topically 2 (two) times daily as needed (Dry skin).         . FeFum-FePo-FA-B Cmp-C-Zn-Mn-Cu (PUREVIT DUALFE PLUS) 162-115.2-1 MG CAPS   Oral   Take 1 tablet by mouth daily.          Marland Kitchen HYDROcodone-acetaminophen (NORCO/VICODIN) 5-325 MG per tablet   Oral   Take 1 tablet by mouth every 6 (six) hours as needed for moderate pain.   15 tablet   0   . ketotifen (ZADITOR) 0.025 % ophthalmic solution  Ophthalmic   Apply 1 drop to eye 2 (two) times daily as needed (For itchy eyes).         Marland Kitchen lactulose (CHRONULAC) 10 GM/15ML solution   Oral   Take 20 g by mouth 2 (two) times daily.         Marland Kitchen loratadine (CLARITIN) 10 MG tablet   Oral   Take 10 mg by mouth daily as needed for allergies.         Marland Kitchen LORazepam (ATIVAN) 0.5 MG tablet      0.5 mg every 8 (eight) hours as needed for anxiety. TAKE ONE TABLET BY MOUTH EVERY 8 HOURS AS NEEDED FOR ANXIETY         . lovastatin (MEVACOR) 40 MG tablet   Oral   Take 40 mg by mouth every evening. TAKE ONE TABLET BY MOUTH ONCE DAILY WITH DINNER         . nitroGLYCERIN (NITROSTAT) 0.4 MG SL tablet   Sublingual   Place 0.4 mg under the tongue  every 5 (five) minutes as needed for chest pain.          Marland Kitchen omeprazole (PRILOSEC) 20 MG capsule   Oral   Take 20 mg by mouth daily.          . ondansetron (ZOFRAN) 4 MG tablet   Oral   Take 1 tablet (4 mg total) by mouth every 8 (eight) hours as needed for nausea or vomiting.   10 tablet   0     Allergies Azelastine; Flonase; Prednisone; Shellfish allergy; and Tramadol  No family history on file.  Social History History  Substance Use Topics  . Smoking status: Former Research scientist (life sciences)  . Smokeless tobacco: Not on file  . Alcohol Use: No    Review of Systems  Constitutional: Negative for fever. Eyes: Negative for visual changes. ENT: Negative for sore throat. Cardiovascular: Negative for chest pain. Respiratory: Negative for shortness of breath. Gastrointestinal: Negative for black or bloody stools Genitourinary: Negative for dysuria. Musculoskeletal: Negative for back pain. Skin: Negative for rash. Neurological: Negative for headaches, focal weakness or numbness. 10 point Review of Systems otherwise negative ____________________________________________   PHYSICAL EXAM:  VITAL SIGNS: ED Triage Vitals  Enc Vitals Group     BP 09/13/14 1525 167/70 mmHg     Pulse Rate 09/13/14 1525 90     Resp 09/13/14 1525 16     Temp 09/13/14 1525 98.3 F (36.8 C)     Temp Source 09/13/14 1525 Oral     SpO2 09/13/14 1525 98 %     Weight 09/13/14 1525 150 lb (68.04 kg)     Height 09/13/14 1525 5\' 2"  (1.575 m)     Head Cir --      Peak Flow --      Pain Score 09/13/14 1525 7     Pain Loc --      Pain Edu? --      Excl. in [place]? --      Constitutional: Alert and oriented. Well appearing and in no distress. Eyes: Conjunctivae are normal. PERRL. Normal extraocular movements. ENT   Head: Normocephalic and atraumatic.   Nose: No congestion/rhinnorhea.   Mouth/Throat: Mucous membranes are moist.   Neck: No stridor. Cardiovascular/Chest: Normal rate, regular rhythm.   No murmurs, rubs, or gallops. Respiratory: Normal respiratory effort without tachypnea nor retractions. Breath sounds are clear and equal bilaterally. No wheezes/rales/rhonchi. Gastrointestinal: Soft. No distention, no guarding, no rebound. Moderate tenderness in the epigastrium. No focal  right upper quadrant tenderness. No lower abdominal tenderness.  Genitourinary/rectal:Deferred Musculoskeletal: Nontender with normal range of motion in all extremities. No joint effusions.  No lower extremity tenderness nor edema. Neurologic:  Normal speech and language. No gross or focal neurologic deficits are appreciated. Skin:  Skin is warm, dry and intact. No rash noted. Psychiatric: Mood and affect are normal. Speech and behavior are normal. Patient exhibits appropriate insight and judgment.  ____________________________________________   EKG I, Lisa Roca, MD, the attending physician have personally viewed and interpreted all ECGs.  89 bpm. Normal sinus rhythm. Narrow QRS. Normal axis. Nonspecific ST-T wave. ____________________________________________  LABS (pertinent positives/negatives)  Metabolic panel significant for potassium 3.2 Troponin less than 0.03 AST 65, alkaline phosphatase 141, bilirubin 1.4 Lipase 57  ____________________________________________  RADIOLOGY All Xrays were viewed by me. Imaging interpreted by Radiologist.  Chest x-ray: Negative  Abdomen 2 view: Normal gas pattern  Ultrasound abdomen: Pending __________________________________________  PROCEDURES  Procedure(s) performed: None Critical Care performed: None  ____________________________________________   ED COURSE / ASSESSMENT AND PLAN  CONSULTATIONS: None  Pertinent labs & imaging results that were available during my care of the patient were reviewed by me and considered in my medical decision making (see chart for details).  Although pain is much improved now from what it was earlier, LFTs  are slightly abnormal with a slightly elevated lipase. Ultrasound will be done first for evaluation of the gallbladder, to evaluate for any evidence of obstructive gallstone pancreatic otitis.   If negative, I would plan to CT the abdomen.   I discussed with the family that if both of these tests are reassuring, she'll likely be able to be discharged home, as her symptoms have been reasonably controlled in terms of pain and nausea.. The patient's husband was given a prescription for Norco and Zofran to be consulted at the pharmacy in anticipation of possible discharge near closing time of the pharmacies.  Patient care transferred to Dr. Edd Fabian at shift changed 10 PM.  Patient / Family / Caregiver informed of clinical course, medical decision-making process, and agree with plan.   I discussed return precautions, follow-up instructions, and discharged instructions with patient and/or family.  ___________________________________________   FINAL CLINICAL IMPRESSION(S) / ED DIAGNOSES   Final diagnoses:  Abdominal pain  Acute pancreatitis, unspecified pancreatitis type    FOLLOW UP  Referred to: Primary care physician this week  Lisa Roca, MD 09/13/14 2121

## 2014-09-13 NOTE — Progress Notes (Signed)
   09/13/14 2200  Clinical Encounter Type  Visited With Patient and family together  Visit Type Initial  Spiritual Encounters  Spiritual Needs Emotional  Stress Factors  Patient Stress Factors Health changes   Faith tradition: Baptist Status: alert and oriented Carotid artery narrowing Family: Husband at bedside Visit Assessment: The patient shared that she has been married for 29 years and her husband sitting bedside is a Theme park manager. They both shared that it has been difficult having to move to pastor around [facility] ([place], [place]); She shared that she lost her son and had a heartattack 5 mos later;  Chaplains and pastoral care can be reached via pager [phone] and by submitting online

## 2014-09-14 NOTE — ED Provider Notes (Signed)
-----------------------------------------   12:07 AM on 09/14/2014 -----------------------------------------  The CT scan results were consistent with possible early focal pancreatitis.  I spent at least 10 minutes with the patient and her husband discussing the findings, giving him my usual recommendations, discussing outpatient management versus admission, and stressing the need for close outpatient follow-up with her GI specialist.  I made it very clear that they should return if she is not able to tolerate by mouth, has worsening abdominal pain, or develops any new symptoms that concern them.  They understand and agree and the patient does strongly prefer to go home at this time.  Hinda Kehr, MD 09/14/14 [phone]

## 2014-09-14 NOTE — Discharge Instructions (Signed)
Return to the emergency department for any new or worsening condition including worsening abdominal pain, vomiting and unable to keep medications down, fever, or black or bloody stools. Take only a liquid diet for one to 2 days, and then advance to a soft diet and then to a regular diet slowly as you tolerated.  We recommend that you call Dr. Rayann Heman and Dr. Edison Pace and schedule the next available follow-up visits to discuss your pancreatitis in the setting of your existing liver disease.   Acute Pancreatitis Acute pancreatitis is a disease in which the pancreas becomes suddenly irritated (inflamed). The pancreas is a large gland behind your stomach. The pancreas makes enzymes that help digest food. The pancreas also makes 2 hormones that help control your blood sugar. Acute pancreatitis happens when the enzymes attack and damage the pancreas. Most attacks last a couple of days and can cause serious problems. HOME CARE  Follow your doctor's diet instructions. You may need to avoid alcohol and limit fat in your diet.  Eat small meals often.  Drink enough fluids to keep your pee (urine) clear or pale yellow.  Only take medicines as told by your doctor.  Avoid drinking alcohol if it caused your disease.  Do not smoke.  Get plenty of rest.  Check your blood sugar at home as told by your doctor.  Keep all doctor visits as told. GET HELP IF:  You do not get better as quickly as expected.  You have new or worsening symptoms.  You have lasting pain, weakness, or feel sick to your stomach (nauseous).  You get better and then have another pain attack. GET HELP RIGHT AWAY IF:   You are unable to eat or keep fluids down.  Your pain becomes severe.  You have a fever or lasting symptoms for more than 2 to 3 days.  You have a fever and your symptoms suddenly get worse.  Your skin or the white part of your eyes turn yellow (jaundice).  You throw up (vomit).  You feel dizzy, or you pass out  (faint).  Your blood sugar is high (over 300 mg/dL). MAKE SURE YOU:   Understand these instructions.  Will watch your condition.  Will get help right away if you are not doing well or get worse. Document Released: 08/08/2007 Document Revised: 07/06/2013 Document Reviewed: 05/31/2011 [facility] Patient Information 2015 [place], [place]. This information is not intended to replace advice given to you by your health care provider. Make sure you discuss any questions you have with your health care provider.

## 2014-09-14 NOTE — ED Notes (Signed)

## 2014-11-16 ENCOUNTER — Inpatient Hospital Stay: Payer: Medicare Other | Attending: Internal Medicine

## 2014-11-16 ENCOUNTER — Inpatient Hospital Stay (HOSPITAL_BASED_OUTPATIENT_CLINIC_OR_DEPARTMENT_OTHER): Payer: Medicare Other | Admitting: Internal Medicine

## 2014-11-16 VITALS — BP 143/77 | HR 74 | Temp 98.0°F | Resp 18 | Ht 62.0 in | Wt 149.9 lb

## 2014-11-16 DIAGNOSIS — F418 Other specified anxiety disorders: Secondary | ICD-10-CM

## 2014-11-16 DIAGNOSIS — Z79899 Other long term (current) drug therapy: Secondary | ICD-10-CM | POA: Insufficient documentation

## 2014-11-16 DIAGNOSIS — R5382 Chronic fatigue, unspecified: Secondary | ICD-10-CM | POA: Insufficient documentation

## 2014-11-16 DIAGNOSIS — K219 Gastro-esophageal reflux disease without esophagitis: Secondary | ICD-10-CM | POA: Insufficient documentation

## 2014-11-16 DIAGNOSIS — D509 Iron deficiency anemia, unspecified: Secondary | ICD-10-CM

## 2014-11-16 DIAGNOSIS — M5030 Other cervical disc degeneration, unspecified cervical region: Secondary | ICD-10-CM | POA: Diagnosis not present

## 2014-11-16 DIAGNOSIS — K769 Liver disease, unspecified: Secondary | ICD-10-CM | POA: Insufficient documentation

## 2014-11-16 DIAGNOSIS — Z87891 Personal history of nicotine dependence: Secondary | ICD-10-CM | POA: Insufficient documentation

## 2014-11-16 DIAGNOSIS — D696 Thrombocytopenia, unspecified: Secondary | ICD-10-CM | POA: Diagnosis not present

## 2014-11-16 DIAGNOSIS — I252 Old myocardial infarction: Secondary | ICD-10-CM | POA: Insufficient documentation

## 2014-11-16 DIAGNOSIS — I10 Essential (primary) hypertension: Secondary | ICD-10-CM | POA: Diagnosis not present

## 2014-11-16 DIAGNOSIS — I2510 Atherosclerotic heart disease of native coronary artery without angina pectoris: Secondary | ICD-10-CM

## 2014-11-16 LAB — CBC WITH DIFFERENTIAL/PLATELET
BASOS PCT: 1 %
Basophils Absolute: 0 10*3/uL (ref 0–0.1)
EOS ABS: 0.2 10*3/uL (ref 0–0.7)
EOS PCT: 3 %
HCT: 41.2 % (ref 35.0–47.0)
HEMOGLOBIN: 13.8 g/dL (ref 12.0–16.0)
LYMPHS ABS: 2.8 10*3/uL (ref 1.0–3.6)
Lymphocytes Relative: 40 %
MCH: 31.5 pg (ref 26.0–34.0)
MCHC: 33.4 g/dL (ref 32.0–36.0)
MCV: 94.3 fL (ref 80.0–100.0)
MONO ABS: 0.5 10*3/uL (ref 0.2–0.9)
MONOS PCT: 7 %
NEUTROS PCT: 49 %
Neutro Abs: 3.4 10*3/uL (ref 1.4–6.5)
PLATELETS: 96 10*3/uL — AB (ref 150–440)
RBC: 4.37 MIL/uL (ref 3.80–5.20)
RDW: 14.5 % (ref 11.5–14.5)
WBC: 7 10*3/uL (ref 3.6–11.0)

## 2014-11-16 LAB — IRON AND TIBC
Iron: 178 ug/dL — ABNORMAL HIGH (ref 28–170)
Saturation Ratios: 56 % — ABNORMAL HIGH (ref 10.4–31.8)
TIBC: 317 ug/dL (ref 250–450)
UIBC: 139 ug/dL

## 2014-11-16 LAB — FERRITIN: Ferritin: 172 ng/mL (ref 11–307)

## 2014-11-16 NOTE — Progress Notes (Signed)
Patient states that overall she has been doing good. She states that her biggest complaint is that she just feels fatigued a lot of the time.

## 2014-11-18 DIAGNOSIS — Z8719 Personal history of other diseases of the digestive system: Secondary | ICD-10-CM | POA: Insufficient documentation

## 2014-12-03 ENCOUNTER — Telehealth: Payer: Self-pay | Admitting: *Deleted

## 2014-12-03 NOTE — Telephone Encounter (Signed)
I attempted returning call for more information as to why she wants to stop iron, but had to leave a message

## 2014-12-03 NOTE — Telephone Encounter (Signed)
Informed patient ok to stop iron. Repeated back to me

## 2014-12-03 NOTE — Telephone Encounter (Signed)
Called back to report that her Liver Specialist at [facility] wants her to come off the iron because her iron levels are high. Reports that she stopped taking it on 9/15, but if necessary can restart it, please call and let her know

## 2014-12-03 NOTE — Telephone Encounter (Signed)
Okay with stopping iron supplement, her last iron levels were doing well. Keep scheduled appts the same. Thanks.

## 2014-12-05 NOTE — Progress Notes (Signed)
[place]  [phone] [phone]     ID: Shannon Sawyer OB: 04-03-1945  MR#: [id number]  JYN#:[id number]  Patient Care Team: Madelyn Brunner, MD as PCP - General (Internal Medicine)  CHIEF COMPLAINT/DIAGNOSIS:  1. Persistent Thrombocytopenia - likely secondary to ITP (immune thrombocytopenic purpura). Also ultrasound  indicating possible cirrhosis of the liver, no splenomegaly. Workuo done on 12/07/13 -  Hb 13.2, WBC 11.6, platelets 129K, ab-retic 0.088.  Serum B12, folate, iron study, HBsAg, HCV Ab, HIV Ab, SIEP, random-UIEP, FANA, PT, PTT, fibrinogen, LDH and haptoglobin all unremarkable. Direct platelet antibody test reports positive IIb/IIIa Ab, otherwise Ib/IX and Ia/IIa are negative.  Ultrasound abdomen reports Mild nodular border of the liver which can be seen in cirrhosis of liver.   2. History of iron deficiency anemia (in April 2015, serum ferritin was 9 and serum iron of 26), status post oral iron therapy.    HISTORY OF PRESENT ILLNESS:  patient returns for continued hematology followup, she was last seen about 1 year ago. She had blood counts monitored intermittently, platelet count has fluctuated. Patient states that she has been doing fairly steady and denies any bleeding symptoms, has minor skin bruising on pressure or trauma only. She is now following with gastroenterology after prior ultrasound had suggested cirrhosis of the liver. Patient denies any history of alcohol intake or liver issues in the past. States that she is doing about the same, has chronic fatigue on exertion.  Appetite is steady.  REVIEW OF SYSTEMS:   ROS As in HPI above. In addition, no new headaches or focal weakness.  No sore throat or dysphagia. Denies cough, shortness of breath, sputum, hemoptysis or chest pain. No dizziness or palpitation. No abdominal pain, constipation, diarrhea, dysuria or hematuria. No new skin rash or bleeding symptoms. No new paresthesias in  extremities.   PAST MEDICAL HISTORY: Reviewed. Past Medical History  Diagnosis Date  . Cirrhosis, nonalcoholic     PORTAL HTN, FATTY TISSUE   . Chronic cough   . Coronary artery disease   . Myocardial infarction   . DDD (degenerative disc disease), cervical     SPONDYLOLISTHESIS  . Carotid artery disease     BILAT.        Marland Kitchen Shortness of breath dyspnea     WITH EXERTION   . Tricuspid insufficiency     MODERATE   . Headache     HX OCCULAR MIGRAINES  . Anxiety   . Depression   . GERD (gastroesophageal reflux disease)   . Hypertension     ESSENTIAL  . Anemia     LOW PLATELET COUNTS  (DR  [facility])    PAST SURGICAL HISTORY: Reviewed. Past Surgical History  Procedure Laterality Date  . Abdominal hysterectomy    . Cardiac catheterization      2 STENTS 2013 IN [place]  . Anterior cervical decomp/discectomy fusion N/A 06/03/2014    Procedure: CERVICAL FIVE-CERVICAL SIX, CERVICAL SIX-CERVICAL SEVEN ANTERIOR CERVICAL DECOMPRESSION/DISCECTOMY ;  Surgeon: Karie Chimera, MD;  Location: [facility];  Service: Neurosurgery;  Laterality: N/A;  . Breast cyst aspiration Right     neg  . Cardiac stents      FAMILY HISTORY: Reviewed. Father had lymphoma at age 25. Son died from hepatocellular carcinoma. States that her mother also had some kind of cancer involving the liver.  SOCIAL HISTORY: Reviewed. Social History  Substance Use Topics  . Smoking status: Former Research scientist (life sciences)  . Smokeless tobacco: Not on  [place]  [phone] [phone]     ID: Shannon Sawyer OB: 04-03-1945  MR#: [id number]  JYN#:[id number]  Patient Care Team: Madelyn Brunner, MD as PCP - General (Internal Medicine)  CHIEF COMPLAINT/DIAGNOSIS:  1. Persistent Thrombocytopenia - likely secondary to ITP (immune thrombocytopenic purpura). Also ultrasound  indicating possible cirrhosis of the liver, no splenomegaly. Workuo done on 12/07/13 -  Hb 13.2, WBC 11.6, platelets 129K, ab-retic 0.088.  Serum B12, folate, iron study, HBsAg, HCV Ab, HIV Ab, SIEP, random-UIEP, FANA, PT, PTT, fibrinogen, LDH and haptoglobin all unremarkable. Direct platelet antibody test reports positive IIb/IIIa Ab, otherwise Ib/IX and Ia/IIa are negative.  Ultrasound abdomen reports Mild nodular border of the liver which can be seen in cirrhosis of liver.   2. History of iron deficiency anemia (in April 2015, serum ferritin was 9 and serum iron of 26), status post oral iron therapy.    HISTORY OF PRESENT ILLNESS:  patient returns for continued hematology followup, she was last seen about 1 year ago. She had blood counts monitored intermittently, platelet count has fluctuated. Patient states that she has been doing fairly steady and denies any bleeding symptoms, has minor skin bruising on pressure or trauma only. She is now following with gastroenterology after prior ultrasound had suggested cirrhosis of the liver. Patient denies any history of alcohol intake or liver issues in the past. States that she is doing about the same, has chronic fatigue on exertion.  Appetite is steady.  REVIEW OF SYSTEMS:   ROS As in HPI above. In addition, no new headaches or focal weakness.  No sore throat or dysphagia. Denies cough, shortness of breath, sputum, hemoptysis or chest pain. No dizziness or palpitation. No abdominal pain, constipation, diarrhea, dysuria or hematuria. No new skin rash or bleeding symptoms. No new paresthesias in  extremities.   PAST MEDICAL HISTORY: Reviewed. Past Medical History  Diagnosis Date  . Cirrhosis, nonalcoholic     PORTAL HTN, FATTY TISSUE   . Chronic cough   . Coronary artery disease   . Myocardial infarction   . DDD (degenerative disc disease), cervical     SPONDYLOLISTHESIS  . Carotid artery disease     BILAT.        Marland Kitchen Shortness of breath dyspnea     WITH EXERTION   . Tricuspid insufficiency     MODERATE   . Headache     HX OCCULAR MIGRAINES  . Anxiety   . Depression   . GERD (gastroesophageal reflux disease)   . Hypertension     ESSENTIAL  . Anemia     LOW PLATELET COUNTS  (DR  [facility])    PAST SURGICAL HISTORY: Reviewed. Past Surgical History  Procedure Laterality Date  . Abdominal hysterectomy    . Cardiac catheterization      2 STENTS 2013 IN [place]  . Anterior cervical decomp/discectomy fusion N/A 06/03/2014    Procedure: CERVICAL FIVE-CERVICAL SIX, CERVICAL SIX-CERVICAL SEVEN ANTERIOR CERVICAL DECOMPRESSION/DISCECTOMY ;  Surgeon: Karie Chimera, MD;  Location: [facility];  Service: Neurosurgery;  Laterality: N/A;  . Breast cyst aspiration Right     neg  . Cardiac stents      FAMILY HISTORY: Reviewed. Father had lymphoma at age 25. Son died from hepatocellular carcinoma. States that her mother also had some kind of cancer involving the liver.  SOCIAL HISTORY: Reviewed. Social History  Substance Use Topics  . Smoking status: Former Research scientist (life sciences)  . Smokeless tobacco: Not on  [place]  [phone]) [phone] [phone]     ID: Shannon Sawyer OB: 04-03-1945  MR#: [id number]  JYN#:[id number]  Patient Care Team: Madelyn Brunner, MD as PCP - General (Internal Medicine)  CHIEF COMPLAINT/DIAGNOSIS:  1. Persistent Thrombocytopenia - likely secondary to ITP (immune thrombocytopenic purpura). Also ultrasound  indicating possible cirrhosis of the liver, no splenomegaly. Workuo done on 12/07/13 -  Hb 13.2, WBC 11.6, platelets 129K, ab-retic 0.088.  Serum B12, folate, iron study, HBsAg, HCV Ab, HIV Ab, SIEP, random-UIEP, FANA, PT, PTT, fibrinogen, LDH and haptoglobin all unremarkable. Direct platelet antibody test reports positive IIb/IIIa Ab, otherwise Ib/IX and Ia/IIa are negative.  Ultrasound abdomen reports Mild nodular border of the liver which can be seen in cirrhosis of liver.   2. History of iron deficiency anemia (in April 2015, serum ferritin was 9 and serum iron of 26), status post oral iron therapy.    HISTORY OF PRESENT ILLNESS:  patient returns for continued hematology followup, she was last seen about 1 year ago. She had blood counts monitored intermittently, platelet count has fluctuated. Patient states that she has been doing fairly steady and denies any bleeding symptoms, has minor skin bruising on pressure or trauma only. She is now following with gastroenterology after prior ultrasound had suggested cirrhosis of the liver. Patient denies any history of alcohol intake or liver issues in the past. States that she is doing about the same, has chronic fatigue on exertion.  Appetite is steady.  REVIEW OF SYSTEMS:   ROS As in HPI above. In addition, no new headaches or focal weakness.  No sore throat or dysphagia. Denies cough, shortness of breath, sputum, hemoptysis or chest pain. No dizziness or palpitation. No abdominal pain, constipation, diarrhea, dysuria or hematuria. No new skin rash or bleeding symptoms. No new paresthesias in  extremities.   PAST MEDICAL HISTORY: Reviewed. Past Medical History  Diagnosis Date  . Cirrhosis, nonalcoholic     PORTAL HTN, FATTY TISSUE   . Chronic cough   . Coronary artery disease   . Myocardial infarction   . DDD (degenerative disc disease), cervical     SPONDYLOLISTHESIS  . Carotid artery disease     BILAT.        Marland Kitchen Shortness of breath dyspnea     WITH EXERTION   . Tricuspid insufficiency     MODERATE   . Headache     HX OCCULAR MIGRAINES  . Anxiety   . Depression   . GERD (gastroesophageal reflux disease)   . Hypertension     ESSENTIAL  . Anemia     LOW PLATELET COUNTS  (DR  [facility])    PAST SURGICAL HISTORY: Reviewed. Past Surgical History  Procedure Laterality Date  . Abdominal hysterectomy    . Cardiac catheterization      2 STENTS 2013 IN [place]  . Anterior cervical decomp/discectomy fusion N/A 06/03/2014    Procedure: CERVICAL FIVE-CERVICAL SIX, CERVICAL SIX-CERVICAL SEVEN ANTERIOR CERVICAL DECOMPRESSION/DISCECTOMY ;  Surgeon: Karie Chimera, MD;  Location: [facility];  Service: Neurosurgery;  Laterality: N/A;  . Breast cyst aspiration Right     neg  . Cardiac stents      FAMILY HISTORY: Reviewed. Father had lymphoma at age 25. Son died from hepatocellular carcinoma. States that her mother also had some kind of cancer involving the liver.  SOCIAL HISTORY: Reviewed. Social History  Substance Use Topics  . Smoking status: Former Research scientist (life sciences)  . Smokeless tobacco: Not on

## 2015-02-08 ENCOUNTER — Inpatient Hospital Stay: Payer: Medicare Other

## 2015-02-18 ENCOUNTER — Encounter: Admission: RE | Payer: Self-pay | Source: Ambulatory Visit

## 2015-02-18 ENCOUNTER — Encounter: Payer: Self-pay | Admitting: *Deleted

## 2015-02-18 ENCOUNTER — Ambulatory Visit: Admission: RE | Admit: 2015-02-18 | Payer: Medicare Other | Source: Ambulatory Visit | Admitting: Gastroenterology

## 2015-02-18 SURGERY — ESOPHAGOGASTRODUODENOSCOPY (EGD) WITH PROPOFOL
Anesthesia: General

## 2015-02-21 ENCOUNTER — Ambulatory Visit
Admission: RE | Admit: 2015-02-21 | Discharge: 2015-02-21 | Disposition: A | Discharge status: Home / Self Care | Payer: Medicare Other | Source: Ambulatory Visit | Attending: Gastroenterology | Admitting: Gastroenterology

## 2015-02-21 ENCOUNTER — Ambulatory Visit: Payer: Medicare Other | Admitting: Anesthesiology

## 2015-02-21 ENCOUNTER — Encounter
Admission: RE | Disposition: A | Discharge status: Home / Self Care | Payer: Self-pay | Source: Ambulatory Visit | Attending: Gastroenterology

## 2015-02-21 ENCOUNTER — Encounter: Payer: Self-pay | Admitting: Anesthesiology

## 2015-02-21 DIAGNOSIS — Z888 Allergy status to other drugs, medicaments and biological substances status: Secondary | ICD-10-CM | POA: Diagnosis not present

## 2015-02-21 DIAGNOSIS — Z91013 Allergy to seafood: Secondary | ICD-10-CM | POA: Insufficient documentation

## 2015-02-21 DIAGNOSIS — F419 Anxiety disorder, unspecified: Secondary | ICD-10-CM | POA: Diagnosis not present

## 2015-02-21 DIAGNOSIS — Z87891 Personal history of nicotine dependence: Secondary | ICD-10-CM | POA: Diagnosis not present

## 2015-02-21 DIAGNOSIS — E782 Mixed hyperlipidemia: Secondary | ICD-10-CM | POA: Diagnosis not present

## 2015-02-21 DIAGNOSIS — D649 Anemia, unspecified: Secondary | ICD-10-CM | POA: Insufficient documentation

## 2015-02-21 DIAGNOSIS — I252 Old myocardial infarction: Secondary | ICD-10-CM | POA: Insufficient documentation

## 2015-02-21 DIAGNOSIS — I119 Hypertensive heart disease without heart failure: Secondary | ICD-10-CM | POA: Diagnosis not present

## 2015-02-21 DIAGNOSIS — Z79899 Other long term (current) drug therapy: Secondary | ICD-10-CM | POA: Insufficient documentation

## 2015-02-21 DIAGNOSIS — M5030 Other cervical disc degeneration, unspecified cervical region: Secondary | ICD-10-CM | POA: Insufficient documentation

## 2015-02-21 DIAGNOSIS — Z7982 Long term (current) use of aspirin: Secondary | ICD-10-CM | POA: Insufficient documentation

## 2015-02-21 DIAGNOSIS — I2510 Atherosclerotic heart disease of native coronary artery without angina pectoris: Secondary | ICD-10-CM | POA: Insufficient documentation

## 2015-02-21 DIAGNOSIS — K760 Fatty (change of) liver, not elsewhere classified: Secondary | ICD-10-CM | POA: Diagnosis not present

## 2015-02-21 DIAGNOSIS — F329 Major depressive disorder, single episode, unspecified: Secondary | ICD-10-CM | POA: Diagnosis not present

## 2015-02-21 DIAGNOSIS — K219 Gastro-esophageal reflux disease without esophagitis: Secondary | ICD-10-CM | POA: Diagnosis not present

## 2015-02-21 DIAGNOSIS — D696 Thrombocytopenia, unspecified: Secondary | ICD-10-CM | POA: Diagnosis not present

## 2015-02-21 DIAGNOSIS — Z90710 Acquired absence of both cervix and uterus: Secondary | ICD-10-CM | POA: Diagnosis not present

## 2015-02-21 DIAGNOSIS — Z9889 Other specified postprocedural states: Secondary | ICD-10-CM | POA: Insufficient documentation

## 2015-02-21 DIAGNOSIS — I8500 Esophageal varices without bleeding: Secondary | ICD-10-CM | POA: Diagnosis not present

## 2015-02-21 DIAGNOSIS — K7460 Unspecified cirrhosis of liver: Secondary | ICD-10-CM | POA: Diagnosis present

## 2015-02-21 HISTORY — PX: ESOPHAGOGASTRODUODENOSCOPY (EGD) WITH PROPOFOL: SHX5813

## 2015-02-21 SURGERY — ESOPHAGOGASTRODUODENOSCOPY (EGD) WITH PROPOFOL
Anesthesia: General

## 2015-02-21 MED ORDER — IPRATROPIUM-ALBUTEROL 0.5-2.5 (3) MG/3ML IN SOLN
RESPIRATORY_TRACT | Status: AC
  Administered 2015-02-21: 3 mL via RESPIRATORY_TRACT
  Filled 2015-02-21: qty 3

## 2015-02-21 MED ORDER — SODIUM CHLORIDE 0.9 % IV SOLN: INTRAVENOUS | Status: DC

## 2015-02-21 MED ORDER — PROPOFOL 10 MG/ML IV BOLUS
INTRAVENOUS | Status: DC | PRN
  Administered 2015-02-21: 25 mg via INTRAVENOUS
  Administered 2015-02-21: 40 mg via INTRAVENOUS
  Administered 2015-02-21: 50 mg via INTRAVENOUS

## 2015-02-21 MED ORDER — FENTANYL CITRATE (PF) 100 MCG/2ML IJ SOLN
INTRAMUSCULAR | Status: DC | PRN
  Administered 2015-02-21: 50 ug via INTRAVENOUS

## 2015-02-21 MED ORDER — MIDAZOLAM HCL 5 MG/5ML IJ SOLN
INTRAMUSCULAR | Status: DC | PRN
  Administered 2015-02-21: 1 mg via INTRAVENOUS

## 2015-02-21 MED ORDER — IPRATROPIUM-ALBUTEROL 0.5-2.5 (3) MG/3ML IN SOLN
3.0000 mL | Freq: Once | RESPIRATORY_TRACT | Status: AC
  Administered 2015-02-21: 3 mL via RESPIRATORY_TRACT

## 2015-02-21 MED ORDER — SODIUM CHLORIDE 0.9 % IV SOLN
INTRAVENOUS | Status: DC
  Administered 2015-02-21: 12:00:00 via INTRAVENOUS

## 2015-02-21 NOTE — Op Note (Signed)
[facility] Patient Name: Shannon Sawyer Procedure Date: 02/21/2015 12:18 PM MRN: [id number] Account #: [id number] Date of Birth: 07/10/1945 Admit Type: Outpatient Age: 69 Room: [facility] ENDO ROOM 2 Gender: Female Note Status: Finalized Procedure:         Upper GI endoscopy Indications:       Cirrhosis rule out esophageal varices Patient Profile:   This is a 69 year old female. Providers:         Gerrit Heck. Rayann Heman, MD Referring MD:      Hewitt Blade. Sarina Ser, MD (Referring MD), Teena Irani MD                     ([facility]) Medicines:         Propofol per Anesthesia Complications:     No immediate complications. Procedure:         Pre-Anesthesia Assessment:                    - Prior to the procedure, a History and Physical was                     performed, and patient medications, allergies and                     sensitivities were reviewed. The patient's tolerance of                     previous anesthesia was reviewed.                    After obtaining informed consent, the endoscope was passed                     under direct vision. Throughout the procedure, the                     patient's blood pressure, pulse, and oxygen saturations                     were monitored continuously. The Endoscope was introduced                     through the mouth, and advanced to the second part of                     duodenum. The upper GI endoscopy was accomplished without                     difficulty. The patient tolerated the procedure well. Findings:      Grade I varices were found in the lower third of the esophagus.      The stomach was normal.      The examined duodenum was normal. Impression:        - Grade I esophageal varices.                    - Normal stomach.                    - Normal examined duodenum.                    - No specimens collected. Recommendation:    - Observe patient in GI recovery unit.                    -  Resume  regular diet.                    - Continue present medications.                    - Return to liver clinic.                    - Consider starting non-selective beta blocker, will defer                     to Dr Edison Pace with [facility].                    - The findings and recommendations were discussed with the                     patient.                    - The findings and recommendations were discussed with the                     patient's family. Procedure Code(s): --- Professional ---                    [phone], Esophagogastroduodenoscopy, flexible, transoral;                     diagnostic, including collection of specimen(s) by                     brushing or washing, when performed (separate procedure) Diagnosis Code(s): --- Professional ---                    K74.60, Unspecified cirrhosis of liver                    I85.10, Secondary esophageal varices without bleeding CPT copyright 2014 American Medical Association. All rights reserved. The codes documented in this report are preliminary and upon coder review may  be revised to meet current compliance requirements. Mellody Life, MD 02/21/2015 12:31:20 PM This report has been signed electronically. Number of Addenda: 0 Note Initiated On: 02/21/2015 12:18 PM      [facility]

## 2015-02-21 NOTE — Anesthesia Procedure Notes (Signed)
Date/Time: 02/21/2015 12:18 PM Performed by: Kennon Holter Pre-anesthesia Checklist: Patient identified, Emergency Drugs available, Suction available, Patient being monitored and Timeout performed Patient Re-evaluated:Patient Re-evaluated prior to inductionOxygen Delivery Method: Nasal cannula Preoxygenation: Pre-oxygenation with 100% oxygen Intubation Type: IV induction

## 2015-02-21 NOTE — Anesthesia Postprocedure Evaluation (Signed)
Anesthesia Post Note  Patient: Shannon Sawyer  Procedure(s) Performed: Procedure(s) (LRB): ESOPHAGOGASTRODUODENOSCOPY (EGD) WITH PROPOFOL (N/A)  Patient location during evaluation: PACU Anesthesia Type: General Level of consciousness: awake and alert Pain management: pain level controlled Vital Signs Assessment: post-procedure vital signs reviewed and stable Respiratory status: spontaneous breathing, nonlabored ventilation, respiratory function stable and patient connected to nasal cannula oxygen Cardiovascular status: blood pressure returned to baseline and stable Postop Assessment: no signs of nausea or vomiting Anesthetic complications: no    Last Vitals:  Filed Vitals:   02/21/15 1253 02/21/15 1303  BP: 127/56 132/57  Pulse: 79 74  Temp:    Resp: 15 16    Last Pain: There were no vitals filed for this visit.               Precious Haws Elwin Tsou

## 2015-02-21 NOTE — Transfer of Care (Signed)
Immediate Anesthesia Transfer of Care Note  Patient: Shannon Sawyer  Procedure(s) Performed: Procedure(s): ESOPHAGOGASTRODUODENOSCOPY (EGD) WITH PROPOFOL (N/A)  Patient Location: PACU and Endoscopy Unit  Anesthesia Type:General  Level of Consciousness: awake, alert  and oriented  Airway & Oxygen Therapy: Patient Spontanous Breathing and Patient connected to nasal cannula oxygen  Post-op Assessment: Report given to RN and Post -op Vital signs reviewed and stable  Post vital signs: Reviewed and stable  Last Vitals:  Filed Vitals:   02/21/15 1234 02/21/15 1237  BP: 157/55   Pulse: 93   Temp: 36.4 C 36.4 C  Resp: 13     Complications: No apparent anesthesia complications

## 2015-02-21 NOTE — H&P (Signed)
Primary Care Physician:  Madelyn Brunner, MD  Pre-Procedure History & Physical: HPI:  Shannon Sawyer is a 69 y.o. female is here for an endoscopy.   Past Medical History  Diagnosis Date  . Cirrhosis, nonalcoholic (HCC)     PORTAL HTN, FATTY TISSUE   . Chronic cough   . Coronary artery disease   . Myocardial infarction ([place])   . DDD (degenerative disc disease), cervical     SPONDYLOLISTHESIS  . Carotid artery disease (HCC)     BILAT.        Marland Kitchen Shortness of breath dyspnea     WITH EXERTION   . Tricuspid insufficiency     MODERATE   . Headache     HX OCCULAR MIGRAINES  . Anxiety   . Depression   . GERD (gastroesophageal reflux disease)   . Hypertension     ESSENTIAL  . Anemia     LOW PLATELET COUNTS  (DR [facility])    Past Surgical History  Procedure Laterality Date  . Abdominal hysterectomy    . Cardiac catheterization      2 STENTS 2013 IN [place]  . Anterior cervical decomp/discectomy fusion N/A 06/03/2014    Procedure: CERVICAL FIVE-CERVICAL SIX, CERVICAL SIX-CERVICAL SEVEN ANTERIOR CERVICAL DECOMPRESSION/DISCECTOMY ;  Surgeon: Karie Chimera, MD;  Location: [facility];  Service: Neurosurgery;  Laterality: N/A;  . Breast cyst aspiration Right     neg  . Cardiac stents      Prior to Admission medications   Medication Sig Start Date End Date Taking? Authorizing Provider  losartan (COZAAR) 25 MG tablet Take 25 mg by mouth daily.   Yes Historical Provider, MD  clobetasol cream (TEMOVATE) [id number]-123456789 % Apply 1 application topically 2 (two) times daily as needed (Dry skin).    Historical Provider, MD  desoximetasone (TOPICORT) 0.25 % cream Apply 1 application topically 2 (two) times daily as needed (Dry skin).    Historical Provider, MD  FeFum-FePo-FA-B Cmp-C-Zn-Mn-Cu (PUREVIT DUALFE PLUS) 162-115.2-1 MG CAPS Take 1 tablet by mouth daily.  09/29/13   Historical Provider, MD  HYDROcodone-acetaminophen (NORCO/VICODIN) 5-325 MG per tablet Take 1 tablet by mouth  every 6 (six) hours as needed for moderate pain. 09/13/14   Lisa Roca, MD  ketotifen (ZADITOR) 0.025 % ophthalmic solution Apply 1 drop to eye 2 (two) times daily as needed (For itchy eyes).    Historical Provider, MD  lactulose (CHRONULAC) 10 GM/15ML solution Take 20 g by mouth 2 (two) times daily.    Historical Provider, MD  loratadine (CLARITIN) 10 MG tablet Take 10 mg by mouth daily as needed for allergies.    Historical Provider, MD  LORazepam (ATIVAN) 0.5 MG tablet 0.5 mg every 8 (eight) hours as needed for anxiety. TAKE ONE TABLET BY MOUTH EVERY 8 HOURS AS NEEDED FOR ANXIETY 04/15/14   Historical Provider, MD  lovastatin (MEVACOR) 40 MG tablet Take 40 mg by mouth every evening. TAKE ONE TABLET BY MOUTH ONCE DAILY WITH DINNER 03/01/14   Historical Provider, MD  nitroGLYCERIN (NITROSTAT) 0.4 MG SL tablet Place 0.4 mg under the tongue every 5 (five) minutes as needed for chest pain.     Historical Provider, MD  omeprazole (PRILOSEC) 20 MG capsule Take 20 mg by mouth daily.     Historical Provider, MD  ondansetron (ZOFRAN) 4 MG tablet Take 1 tablet (4 mg total) by mouth every 8 (eight) hours as needed for nausea or vomiting. 09/13/14   Lisa Roca, MD  Allergies as of 02/18/2015 - Review Complete 02/18/2015  Allergen Reaction Noted  . Azelastine Swelling 05/07/2014  . Flonase [fluticasone propionate] Swelling 05/07/2014  . Prednisone Swelling 05/07/2014  . Shellfish allergy Swelling 05/07/2014  . Tramadol Nausea Only 05/07/2014    History reviewed. No pertinent family history.  Social History   Social History  . Marital Status: Married    Spouse Name: N/A  . Number of Children: N/A  . Years of Education: N/A   Occupational History  . Not on file.   Social History Main Topics  . Smoking status: Former Research scientist (life sciences)  . Smokeless tobacco: Not on file  . Alcohol Use: No  . Drug Use: No  . Sexual Activity: Not on file   Other Topics Concern  . Not on file   Social History  Narrative     Physical Exam: BP 162/59 mmHg  Pulse 80  Temp(Src) 97.8 F (36.6 C) (Oral)  Resp 19  SpO2 100% General:   Alert,  pleasant and cooperative in NAD Head:  Normocephalic and atraumatic. Neck:  Supple; no masses or thyromegaly. Lungs:  Clear throughout to auscultation.    Heart:  Regular rate and rhythm. Abdomen:  Soft, nontender and nondistended. Normal bowel sounds, without guarding, and without rebound.   Neurologic:  Alert and  oriented x4;  grossly normal neurologically.  Impression/Plan: Shannon Sawyer is here for an endoscopy to be performed for variceal screening  Risks, benefits, limitations, and alternatives regarding  endoscopy have been reviewed with the patient.  Questions have been answered.  All parties agreeable.   Josefine Class, MD  02/21/2015, 12:09 PM

## 2015-02-21 NOTE — Discharge Instructions (Signed)

## 2015-02-21 NOTE — Anesthesia Preprocedure Evaluation (Signed)
Anesthesia Evaluation  Patient identified by MRN, date of birth, ID band Patient awake    Reviewed: Allergy & Precautions, H&P , NPO status , Patient's Chart, lab work & pertinent test results  History of Anesthesia Complications Negative for: history of anesthetic complications  Airway Mallampati: III  TM Distance: >3 FB Neck ROM: limited    Dental no notable dental hx. (+) Teeth Intact   Pulmonary neg shortness of breath, former smoker,    Pulmonary exam normal breath sounds clear to auscultation       Cardiovascular Exercise Tolerance: Good hypertension, (-) angina+ CAD, + Past MI, + Cardiac Stents and + Peripheral Vascular Disease  (-) DOE Normal cardiovascular exam Rhythm:regular Rate:Normal     Neuro/Psych  Headaches, PSYCHIATRIC DISORDERS Anxiety Depression  Neuromuscular disease    GI/Hepatic Neg liver ROS, GERD  Controlled,  Endo/Other  negative endocrine ROS  Renal/GU negative Renal ROS  negative genitourinary   Musculoskeletal  (+) Arthritis ,   Abdominal   Peds  Hematology negative hematology ROS (+)   Anesthesia Other Findings Past Medical History:   Cirrhosis, nonalcoholic (HCC)                                  Comment:PORTAL HTN, FATTY TISSUE    Chronic cough                                                Coronary artery disease                                      Myocardial infarction (HCC)                                  DDD (degenerative disc disease), cervical                      Comment:SPONDYLOLISTHESIS   Carotid artery disease (HCC)                                   Comment:BILAT.         Shortness of breath dyspnea                                    Comment:WITH EXERTION    Tricuspid insufficiency                                        Comment:MODERATE    Headache                                                       Comment:HX OCCULAR MIGRAINES   Anxiety  Depression                                                   GERD (gastroesophageal reflux disease)                       Hypertension                                                   Comment:ESSENTIAL   Anemia                                                         Comment:LOW PLATELET COUNTS  (DR PANDIT [facility]               CTR)  Past Surgical History:   ABDOMINAL HYSTERECTOMY                                        CARDIAC CATHETERIZATION                                         Comment:2 STENTS 2013 IN [place]   ANTERIOR CERVICAL DECOMP/DISCECTOMY FUSION      N/A 06/03/2014      Comment:Procedure: CERVICAL FIVE-CERVICAL SIX, CERVICAL              SIX-CERVICAL SEVEN ANTERIOR CERVICAL               DECOMPRESSION/DISCECTOMY ;  Surgeon: Karie Chimera, MD;  Location: [place] NEURO ORS;  Service:              Neurosurgery;  Laterality: N/A;   BREAST CYST ASPIRATION                          Right                Comment:neg   cardiac stents                                                  Reproductive/Obstetrics negative OB ROS                             Anesthesia Physical Anesthesia Plan  ASA: IV  Anesthesia Plan: General   Post-op Pain Management:    Induction:   Airway Management Planned:   Additional Equipment:   Intra-op Plan:   Post-operative Plan:   Informed Consent: I have reviewed the patients History and Physical, chart, labs and discussed the procedure including the risks, benefits and alternatives for the proposed  anesthesia with the patient or authorized representative who has indicated his/her understanding and acceptance.   Dental Advisory Given  Plan Discussed with: Anesthesiologist, CRNA and Surgeon  Anesthesia Plan Comments: (Patient informed that they are higher risk for complications from anesthesia during this procedure due to their medical history.  Patient voiced  understanding. )        Anesthesia Quick Evaluation

## 2015-02-24 ENCOUNTER — Encounter: Payer: Self-pay | Admitting: Gastroenterology

## 2015-05-02 ENCOUNTER — Other Ambulatory Visit: Payer: Medicare Other

## 2015-05-02 ENCOUNTER — Ambulatory Visit: Payer: Medicare Other

## 2015-06-30 DIAGNOSIS — R413 Other amnesia: Secondary | ICD-10-CM | POA: Insufficient documentation

## 2015-07-29 ENCOUNTER — Other Ambulatory Visit: Payer: Self-pay | Admitting: Family Medicine

## 2015-07-29 DIAGNOSIS — Z1231 Encounter for screening mammogram for malignant neoplasm of breast: Secondary | ICD-10-CM

## 2015-08-09 ENCOUNTER — Other Ambulatory Visit
Admission: RE | Admit: 2015-08-09 | Discharge: 2015-08-09 | Disposition: A | Discharge status: Home / Self Care | Payer: Medicare Other | Source: Ambulatory Visit | Attending: Student | Admitting: Student

## 2015-08-09 ENCOUNTER — Other Ambulatory Visit: Payer: Self-pay | Admitting: Student

## 2015-08-09 DIAGNOSIS — K7460 Unspecified cirrhosis of liver: Secondary | ICD-10-CM

## 2015-08-09 LAB — AMMONIA: AMMONIA: 45 umol/L — AB (ref 9–35)

## 2015-08-15 ENCOUNTER — Ambulatory Visit
Admission: RE | Admit: 2015-08-15 | Discharge: 2015-08-15 | Disposition: A | Discharge status: Home / Self Care | Payer: Medicare Other | Source: Ambulatory Visit | Attending: Student | Admitting: Student

## 2015-08-15 DIAGNOSIS — K7460 Unspecified cirrhosis of liver: Secondary | ICD-10-CM | POA: Diagnosis not present

## 2015-08-29 ENCOUNTER — Other Ambulatory Visit: Payer: Self-pay | Admitting: Family Medicine

## 2015-08-29 ENCOUNTER — Ambulatory Visit
Admission: RE | Admit: 2015-08-29 | Discharge: 2015-08-29 | Disposition: A | Discharge status: Home / Self Care | Payer: Medicare Other | Source: Ambulatory Visit | Attending: Family Medicine | Admitting: Family Medicine

## 2015-08-29 DIAGNOSIS — Z1231 Encounter for screening mammogram for malignant neoplasm of breast: Secondary | ICD-10-CM | POA: Insufficient documentation

## 2015-09-02 HISTORY — PX: BREAST BIOPSY: SHX20

## 2015-11-15 DIAGNOSIS — F410 Panic disorder [episodic paroxysmal anxiety] without agoraphobia: Secondary | ICD-10-CM | POA: Insufficient documentation

## 2016-03-06 DIAGNOSIS — R6889 Other general symptoms and signs: Secondary | ICD-10-CM | POA: Diagnosis not present

## 2016-03-06 DIAGNOSIS — L282 Other prurigo: Secondary | ICD-10-CM | POA: Diagnosis not present

## 2016-03-06 DIAGNOSIS — J0100 Acute maxillary sinusitis, unspecified: Secondary | ICD-10-CM | POA: Diagnosis not present

## 2016-03-06 DIAGNOSIS — K745 Biliary cirrhosis, unspecified: Secondary | ICD-10-CM | POA: Diagnosis not present

## 2016-03-13 DIAGNOSIS — E782 Mixed hyperlipidemia: Secondary | ICD-10-CM | POA: Diagnosis not present

## 2016-04-02 DIAGNOSIS — R0981 Nasal congestion: Secondary | ICD-10-CM | POA: Diagnosis not present

## 2016-04-02 DIAGNOSIS — R05 Cough: Secondary | ICD-10-CM | POA: Diagnosis not present

## 2016-04-16 DIAGNOSIS — I25118 Atherosclerotic heart disease of native coronary artery with other forms of angina pectoris: Secondary | ICD-10-CM | POA: Diagnosis not present

## 2016-04-16 DIAGNOSIS — I10 Essential (primary) hypertension: Secondary | ICD-10-CM | POA: Diagnosis not present

## 2016-04-16 DIAGNOSIS — I6523 Occlusion and stenosis of bilateral carotid arteries: Secondary | ICD-10-CM | POA: Diagnosis not present

## 2016-04-16 DIAGNOSIS — E782 Mixed hyperlipidemia: Secondary | ICD-10-CM | POA: Diagnosis not present

## 2016-04-24 DIAGNOSIS — E876 Hypokalemia: Secondary | ICD-10-CM | POA: Diagnosis not present

## 2016-04-24 DIAGNOSIS — Z974 Presence of external hearing-aid: Secondary | ICD-10-CM | POA: Diagnosis not present

## 2016-04-24 DIAGNOSIS — I252 Old myocardial infarction: Secondary | ICD-10-CM | POA: Diagnosis not present

## 2016-04-24 DIAGNOSIS — J309 Allergic rhinitis, unspecified: Secondary | ICD-10-CM | POA: Diagnosis not present

## 2016-04-24 DIAGNOSIS — Z0000 Encounter for general adult medical examination without abnormal findings: Secondary | ICD-10-CM | POA: Diagnosis not present

## 2016-04-24 DIAGNOSIS — Z7982 Long term (current) use of aspirin: Secondary | ICD-10-CM | POA: Diagnosis not present

## 2016-04-24 DIAGNOSIS — I10 Essential (primary) hypertension: Secondary | ICD-10-CM | POA: Diagnosis not present

## 2016-04-24 DIAGNOSIS — E7800 Pure hypercholesterolemia, unspecified: Secondary | ICD-10-CM | POA: Diagnosis not present

## 2016-04-24 DIAGNOSIS — I8500 Esophageal varices without bleeding: Secondary | ICD-10-CM | POA: Diagnosis not present

## 2016-04-24 DIAGNOSIS — K7581 Nonalcoholic steatohepatitis (NASH): Secondary | ICD-10-CM | POA: Diagnosis not present

## 2016-04-24 DIAGNOSIS — R600 Localized edema: Secondary | ICD-10-CM | POA: Diagnosis not present

## 2016-04-24 DIAGNOSIS — D696 Thrombocytopenia, unspecified: Secondary | ICD-10-CM | POA: Diagnosis not present

## 2016-04-24 DIAGNOSIS — H9113 Presbycusis, bilateral: Secondary | ICD-10-CM | POA: Diagnosis not present

## 2016-04-24 DIAGNOSIS — Z6828 Body mass index (BMI) 28.0-28.9, adult: Secondary | ICD-10-CM | POA: Diagnosis not present

## 2016-05-07 DIAGNOSIS — I6523 Occlusion and stenosis of bilateral carotid arteries: Secondary | ICD-10-CM | POA: Diagnosis not present

## 2016-05-07 DIAGNOSIS — I25118 Atherosclerotic heart disease of native coronary artery with other forms of angina pectoris: Secondary | ICD-10-CM | POA: Diagnosis not present

## 2016-05-14 DIAGNOSIS — I2510 Atherosclerotic heart disease of native coronary artery without angina pectoris: Secondary | ICD-10-CM | POA: Diagnosis not present

## 2016-05-14 DIAGNOSIS — E782 Mixed hyperlipidemia: Secondary | ICD-10-CM | POA: Diagnosis not present

## 2016-05-14 DIAGNOSIS — I10 Essential (primary) hypertension: Secondary | ICD-10-CM | POA: Diagnosis not present

## 2016-05-14 DIAGNOSIS — I6523 Occlusion and stenosis of bilateral carotid arteries: Secondary | ICD-10-CM | POA: Diagnosis not present

## 2016-05-21 DIAGNOSIS — K7460 Unspecified cirrhosis of liver: Secondary | ICD-10-CM | POA: Diagnosis not present

## 2016-05-21 DIAGNOSIS — Z79899 Other long term (current) drug therapy: Secondary | ICD-10-CM | POA: Diagnosis not present

## 2016-05-29 DIAGNOSIS — K7460 Unspecified cirrhosis of liver: Secondary | ICD-10-CM | POA: Diagnosis not present

## 2016-07-03 DIAGNOSIS — R739 Hyperglycemia, unspecified: Secondary | ICD-10-CM | POA: Diagnosis not present

## 2016-07-03 DIAGNOSIS — R413 Other amnesia: Secondary | ICD-10-CM | POA: Diagnosis not present

## 2016-07-03 DIAGNOSIS — H5319 Other subjective visual disturbances: Secondary | ICD-10-CM | POA: Diagnosis not present

## 2016-07-03 DIAGNOSIS — H26491 Other secondary cataract, right eye: Secondary | ICD-10-CM | POA: Diagnosis not present

## 2016-07-03 DIAGNOSIS — H40012 Open angle with borderline findings, low risk, left eye: Secondary | ICD-10-CM | POA: Diagnosis not present

## 2016-07-03 DIAGNOSIS — I10 Essential (primary) hypertension: Secondary | ICD-10-CM | POA: Diagnosis not present

## 2016-07-03 LAB — HEMOGLOBIN A1C: Hemoglobin A1C: 5.7

## 2016-08-06 DIAGNOSIS — H401121 Primary open-angle glaucoma, left eye, mild stage: Secondary | ICD-10-CM | POA: Diagnosis not present

## 2016-08-07 DIAGNOSIS — M79602 Pain in left arm: Secondary | ICD-10-CM | POA: Diagnosis not present

## 2016-08-07 DIAGNOSIS — M419 Scoliosis, unspecified: Secondary | ICD-10-CM | POA: Diagnosis not present

## 2016-08-07 DIAGNOSIS — R29898 Other symptoms and signs involving the musculoskeletal system: Secondary | ICD-10-CM | POA: Diagnosis not present

## 2016-08-07 DIAGNOSIS — M5442 Lumbago with sciatica, left side: Secondary | ICD-10-CM | POA: Diagnosis not present

## 2016-08-07 DIAGNOSIS — R202 Paresthesia of skin: Secondary | ICD-10-CM | POA: Diagnosis not present

## 2016-08-07 DIAGNOSIS — G8929 Other chronic pain: Secondary | ICD-10-CM | POA: Diagnosis not present

## 2016-08-07 DIAGNOSIS — M542 Cervicalgia: Secondary | ICD-10-CM | POA: Diagnosis not present

## 2016-08-14 DIAGNOSIS — M25559 Pain in unspecified hip: Secondary | ICD-10-CM | POA: Diagnosis not present

## 2016-08-14 DIAGNOSIS — M7062 Trochanteric bursitis, left hip: Secondary | ICD-10-CM | POA: Diagnosis not present

## 2016-08-29 DIAGNOSIS — Z87891 Personal history of nicotine dependence: Secondary | ICD-10-CM | POA: Diagnosis not present

## 2016-08-29 DIAGNOSIS — R188 Other ascites: Secondary | ICD-10-CM | POA: Diagnosis not present

## 2016-08-29 DIAGNOSIS — Z8719 Personal history of other diseases of the digestive system: Secondary | ICD-10-CM | POA: Diagnosis not present

## 2016-08-29 DIAGNOSIS — Z79899 Other long term (current) drug therapy: Secondary | ICD-10-CM | POA: Diagnosis not present

## 2016-08-29 DIAGNOSIS — K7460 Unspecified cirrhosis of liver: Secondary | ICD-10-CM | POA: Diagnosis not present

## 2016-08-29 DIAGNOSIS — R0602 Shortness of breath: Secondary | ICD-10-CM | POA: Diagnosis not present

## 2016-08-29 DIAGNOSIS — Z5181 Encounter for therapeutic drug level monitoring: Secondary | ICD-10-CM | POA: Diagnosis not present

## 2016-08-29 DIAGNOSIS — R140 Abdominal distension (gaseous): Secondary | ICD-10-CM | POA: Diagnosis not present

## 2016-08-30 DIAGNOSIS — K7460 Unspecified cirrhosis of liver: Secondary | ICD-10-CM | POA: Diagnosis not present

## 2016-08-30 DIAGNOSIS — R188 Other ascites: Secondary | ICD-10-CM | POA: Diagnosis not present

## 2016-09-10 DIAGNOSIS — K766 Portal hypertension: Secondary | ICD-10-CM | POA: Diagnosis not present

## 2016-09-10 DIAGNOSIS — K7460 Unspecified cirrhosis of liver: Secondary | ICD-10-CM | POA: Diagnosis not present

## 2016-09-11 ENCOUNTER — Emergency Department
Admission: EM | Admit: 2016-09-11 | Discharge: 2016-09-11 | Disposition: A | Discharge status: Home / Self Care | Payer: Medicare HMO | Attending: Emergency Medicine | Admitting: Emergency Medicine

## 2016-09-11 ENCOUNTER — Encounter: Payer: Self-pay | Admitting: Emergency Medicine

## 2016-09-11 DIAGNOSIS — S59911A Unspecified injury of right forearm, initial encounter: Secondary | ICD-10-CM | POA: Diagnosis present

## 2016-09-11 DIAGNOSIS — M7989 Other specified soft tissue disorders: Secondary | ICD-10-CM

## 2016-09-11 DIAGNOSIS — T80810A Extravasation of vesicant antineoplastic chemotherapy, initial encounter: Secondary | ICD-10-CM | POA: Insufficient documentation

## 2016-09-11 DIAGNOSIS — Y9389 Activity, other specified: Secondary | ICD-10-CM | POA: Insufficient documentation

## 2016-09-11 DIAGNOSIS — R2231 Localized swelling, mass and lump, right upper limb: Secondary | ICD-10-CM | POA: Insufficient documentation

## 2016-09-11 DIAGNOSIS — S4981XA Other specified injuries of right shoulder and upper arm, initial encounter: Secondary | ICD-10-CM | POA: Diagnosis not present

## 2016-09-11 DIAGNOSIS — I219 Acute myocardial infarction, unspecified: Secondary | ICD-10-CM | POA: Diagnosis not present

## 2016-09-11 DIAGNOSIS — Z79899 Other long term (current) drug therapy: Secondary | ICD-10-CM | POA: Diagnosis not present

## 2016-09-11 DIAGNOSIS — Z87891 Personal history of nicotine dependence: Secondary | ICD-10-CM | POA: Insufficient documentation

## 2016-09-11 DIAGNOSIS — Y998 Other external cause status: Secondary | ICD-10-CM | POA: Insufficient documentation

## 2016-09-11 DIAGNOSIS — Y69 Unspecified misadventure during surgical and medical care: Secondary | ICD-10-CM | POA: Insufficient documentation

## 2016-09-11 DIAGNOSIS — I10 Essential (primary) hypertension: Secondary | ICD-10-CM | POA: Insufficient documentation

## 2016-09-11 DIAGNOSIS — I2510 Atherosclerotic heart disease of native coronary artery without angina pectoris: Secondary | ICD-10-CM | POA: Insufficient documentation

## 2016-09-11 DIAGNOSIS — Y9289 Other specified places as the place of occurrence of the external cause: Secondary | ICD-10-CM | POA: Diagnosis not present

## 2016-09-11 DIAGNOSIS — T148XXA Other injury of unspecified body region, initial encounter: Secondary | ICD-10-CM

## 2016-09-11 NOTE — ED Provider Notes (Signed)
[facility] Emergency Department Provider Note   ____________________________________________   First MD Initiated Contact with Patient 09/11/16 0408     (approximate)  I have reviewed the triage vital signs and the nursing notes.   HISTORY  Chief Complaint Arm Injury    HPI Shannon Sawyer is a 71 y.o. female who comes into the hospital today with some right arm swelling. The patient had some IV contrast for a CT scan today at [facility]. She reports that they did have a difficult time obtaining IV line. She reports that when they went to place the contrast extravasated and went into her arm instead of through her IV.The patient reports that she had lots of pain. This occurred at approximately 9:30 PM and she left [facility] at about 10 PM. She had her arm packed with an ice pack and it was wrapped up. She reports that they told her not to sleep with this so she took it off when she arrived home. She went to bed but when she woke up her arm felt a little tingly. She reports that her arm had swollen significantly compared to previous. She contacted [facility] and was told to come into the emergency department. She reports that she feels a little numb on the dorsum of her hand but no tingling. The patient rates her pain a 4 out of 10 in intensity and reports that movement makes it worse. The patient states that her arm just feels really tight.   Past Medical History:  Diagnosis Date  . Anemia    LOW PLATELET COUNTS  (DR PANDIT [facility])  . Anxiety   . Carotid artery disease (HCC)    BILAT.        Marland Kitchen Chronic cough   . Cirrhosis, nonalcoholic (HCC)    PORTAL HTN, FATTY TISSUE   . Coronary artery disease   . DDD (degenerative disc disease), cervical    SPONDYLOLISTHESIS  . Depression   . GERD (gastroesophageal reflux disease)   . Headache    HX OCCULAR MIGRAINES  . Hypertension    ESSENTIAL  . Myocardial infarction ([place])   . Shortness of  breath dyspnea    WITH EXERTION   . Tricuspid insufficiency    MODERATE     Patient Active Problem List   Diagnosis Date Noted  . Cervical radiculopathy 06/03/2014  . TI (tricuspid incompetence) 02/12/2014  . Carotid artery narrowing 01/12/2014  . Cirrhosis of liver not due to alcohol ([place]) 01/12/2014  . Anemia, iron deficiency 01/12/2014  . Disease of liver 01/12/2014  . Breathlessness on exertion 01/12/2014  . Cervical radiculitis 11/04/2013  . DDD (degenerative disc disease), cervical 11/04/2013  . SPL (spondylolisthesis) 11/04/2013  . Arteriosclerosis of coronary artery 05/20/2013  . Essential (primary) hypertension 05/20/2013  . Combined fat and carbohydrate induced hyperlipemia 05/20/2013    Past Surgical History:  Procedure Laterality Date  . ABDOMINAL HYSTERECTOMY    . ANTERIOR CERVICAL DECOMP/DISCECTOMY FUSION N/A 06/03/2014   Procedure: CERVICAL FIVE-CERVICAL SIX, CERVICAL SIX-CERVICAL SEVEN ANTERIOR CERVICAL DECOMPRESSION/DISCECTOMY ;  Surgeon: Karie Chimera, MD;  Location: [place] NEURO ORS;  Service: Neurosurgery;  Laterality: N/A;  . BREAST BIOPSY Left 09/02/2015   benign  . BREAST CYST ASPIRATION Left    neg  . CARDIAC CATHETERIZATION     2 STENTS 2013 IN [place]  . cardiac stents    . ESOPHAGOGASTRODUODENOSCOPY (EGD) WITH PROPOFOL N/A 02/21/2015   Procedure: ESOPHAGOGASTRODUODENOSCOPY (EGD) WITH PROPOFOL;  Surgeon: Grace Blight  Rayann Heman, MD;  Location: [place] ENDOSCOPY;  Service: Endoscopy;  Laterality: N/A;    Prior to Admission medications   Medication Sig Start Date End Date Taking? Authorizing Provider  clobetasol cream (TEMOVATE) 8.58 % Apply 1 application topically 2 (two) times daily as needed (Dry skin).    [provider]  desoximetasone (TOPICORT) 0.25 % cream Apply 1 application topically 2 (two) times daily as needed (Dry skin).    [provider]  FeFum-FePo-FA-B Cmp-C-Zn-Mn-Cu (PUREVIT DUALFE PLUS) 162-115.2-1 MG CAPS Take 1 tablet by  mouth daily.  09/29/13   [provider]  HYDROcodone-acetaminophen (NORCO/VICODIN) 5-325 MG per tablet Take 1 tablet by mouth every 6 (six) hours as needed for moderate pain. 09/13/14   Lisa Roca, MD  ketotifen (ZADITOR) 0.025 % ophthalmic solution Apply 1 drop to eye 2 (two) times daily as needed (For itchy eyes).    [provider]  lactulose (CHRONULAC) 10 GM/15ML solution Take 20 g by mouth 2 (two) times daily.    [provider]  loratadine (CLARITIN) 10 MG tablet Take 10 mg by mouth daily as needed for allergies.    [provider]  LORazepam (ATIVAN) 0.5 MG tablet 0.5 mg every 8 (eight) hours as needed for anxiety. TAKE ONE TABLET BY MOUTH EVERY 8 HOURS AS NEEDED FOR ANXIETY 04/15/14   [provider]  losartan (COZAAR) 25 MG tablet Take 25 mg by mouth daily.    [provider]  lovastatin (MEVACOR) 40 MG tablet Take 40 mg by mouth every evening. TAKE ONE TABLET BY MOUTH ONCE DAILY WITH DINNER 03/01/14   [provider]  nitroGLYCERIN (NITROSTAT) 0.4 MG SL tablet Place 0.4 mg under the tongue every 5 (five) minutes as needed for chest pain.     [provider]  omeprazole (PRILOSEC) 20 MG capsule Take 20 mg by mouth daily.     [provider]  ondansetron (ZOFRAN) 4 MG tablet Take 1 tablet (4 mg total) by mouth every 8 (eight) hours as needed for nausea or vomiting. 09/13/14   Lisa Roca, MD    Allergies Azelastine; Flonase [fluticasone propionate]; Prednisone; Shellfish allergy; and Tramadol  History reviewed. No pertinent family history.  Social History Social History  Substance Use Topics  . Smoking status: Former Research scientist (life sciences)  . Smokeless tobacco: Never Used  . Alcohol use No    Review of Systems  Constitutional: No fever/chills Eyes: No visual changes. ENT: No sore throat. Cardiovascular: Denies chest pain. Respiratory: Denies shortness of breath. Gastrointestinal: No abdominal pain.  No  nausea, no vomiting.  No diarrhea.  No constipation. Genitourinary: Negative for dysuria. Musculoskeletal: Right arm pain and swelling Skin: Negative for rash. Neurological: Numbness to the dorsum of righthand.   ____________________________________________   PHYSICAL EXAM:  VITAL SIGNS: ED Triage Vitals [09/11/16 0347]  Enc Vitals Group     BP                                          09/11/16 0702 145/49     Pulse                                      09/11/16 0702 80     Resp  09/11/16 0702 18     Temp      Temp src      SpO2                                       09/11/16 0702 97%     Weight 160 lb (72.6 kg)     Height      Head Circumference      Peak Flow      Pain Score      Pain Loc      Pain Edu?      Excl. in [place]?     Constitutional: Alert and oriented. Well appearing and in mild distress. Eyes: Conjunctivae are normal. PERRL. EOMI. Head: Atraumatic. Nose: No congestion/rhinnorhea. Mouth/Throat: Mucous membranes are moist.  Oropharynx non-erythematous. Cardiovascular: Normal rate, regular rhythm. Grossly normal heart sounds.  Good peripheral circulation. Respiratory: Normal respiratory effort.  No retractions. Lungs CTAB. Gastrointestinal: Soft and nontender. No distention. Positive bowel sounds Musculoskeletal: Right arm swelling with some blisters. The patient has some fullness to her compartments but they're still compressible. The patient has 2+ right radial pulse and her pain is minimal. There is no erythema and there is some blisters to the forearm. Neurologic:  Normal speech and language.  Skin:  Skin is warm, dry and intact.  Psychiatric: Mood and affect are normal. Speech and behavior are normal.  ____________________________________________   LABS (all labs ordered are listed, but only abnormal results are displayed)  Labs Reviewed - No data to  display ____________________________________________  EKG  none ____________________________________________  RADIOLOGY  No results found.  ____________________________________________   PROCEDURES  Procedure(s) performed: None  Procedures  Critical Care performed: No  ____________________________________________   INITIAL IMPRESSION / ASSESSMENT AND PLAN / ED COURSE  Pertinent labs & imaging results that were available during my care of the patient were reviewed by me and considered in my medical decision making (see chart for details).  This is a 71 year old female who comes into the hospital today with some right arm swelling after having a contrast extravasation event earlier this evening. We did wrap the patient's arm and some warm blankets as well as help her to elevate it. I contacted the radiologist and they said that that was appropriate management. The concern is for compartment syndrome but the patient still has pulses and has no pallor. Her pain is minimal and she has no paresthesias or cold arm to the touch. She does have some numbness but she reports it is mild. I monitored the patient for approximately an hour and then went back into her room. She reports that the swelling seems to have gotten better and the numbness does also seem improved. The patient has an appointment with her physician today who ordered her CT scan. I encouraged her to follow up with her physician who can continue to monitor the improvement of her swelling. The patient will be discharged home.      ____________________________________________   FINAL CLINICAL IMPRESSION(S) / ED DIAGNOSES  Final diagnoses:  Extravasation injury  Arm swelling      NEW MEDICATIONS STARTED DURING THIS VISIT:  New Prescriptions   No medications on file     Note:  This document was prepared using Dragon voice recognition software and may include unintentional dictation errors.    Loney Hering, MD 09/11/16 [phone]

## 2016-09-11 NOTE — Discharge Instructions (Signed)
Please continue to elevate your arm and ice and heat your arm. Please follow up with your primary care provider to have your arm further evaluated. If you have worsening numbness or tingling, increased pain or change in the color of your arm please return to the hospital. Also if you feel as if you can't move your fingers.

## 2016-09-11 NOTE — ED Triage Notes (Signed)
Pt ambulatory to triage with steady gait, holding right arm extended out. Pt reports she was seen at [place] today, had CT, contrast through right [facility]. Pt to ED tonight due to swelling in right upper extremity as well as blisters to the forearm. Pt expresses pain in area, 4/10.

## 2016-09-13 DIAGNOSIS — K7460 Unspecified cirrhosis of liver: Secondary | ICD-10-CM | POA: Diagnosis not present

## 2016-09-17 DIAGNOSIS — K7460 Unspecified cirrhosis of liver: Secondary | ICD-10-CM | POA: Diagnosis not present

## 2016-09-17 DIAGNOSIS — T7840XD Allergy, unspecified, subsequent encounter: Secondary | ICD-10-CM | POA: Diagnosis not present

## 2016-09-17 DIAGNOSIS — L03113 Cellulitis of right upper limb: Secondary | ICD-10-CM | POA: Diagnosis not present

## 2016-09-17 DIAGNOSIS — R69 Illness, unspecified: Secondary | ICD-10-CM | POA: Diagnosis not present

## 2016-09-24 IMAGING — MG MM DIGITAL DIAGNOSTIC UNILAT*L*
2 series · 2 of 2 positions shown · non-contrast
Comparison: Previous exam(s).

CLINICAL DATA: Ultrasound-guided biopsy was performed of a left
breast mass 7 o'clock position.

EXAM:
DIAGNOSTIC LEFT MAMMOGRAM POST ULTRASOUND BIOPSY

[L CC]
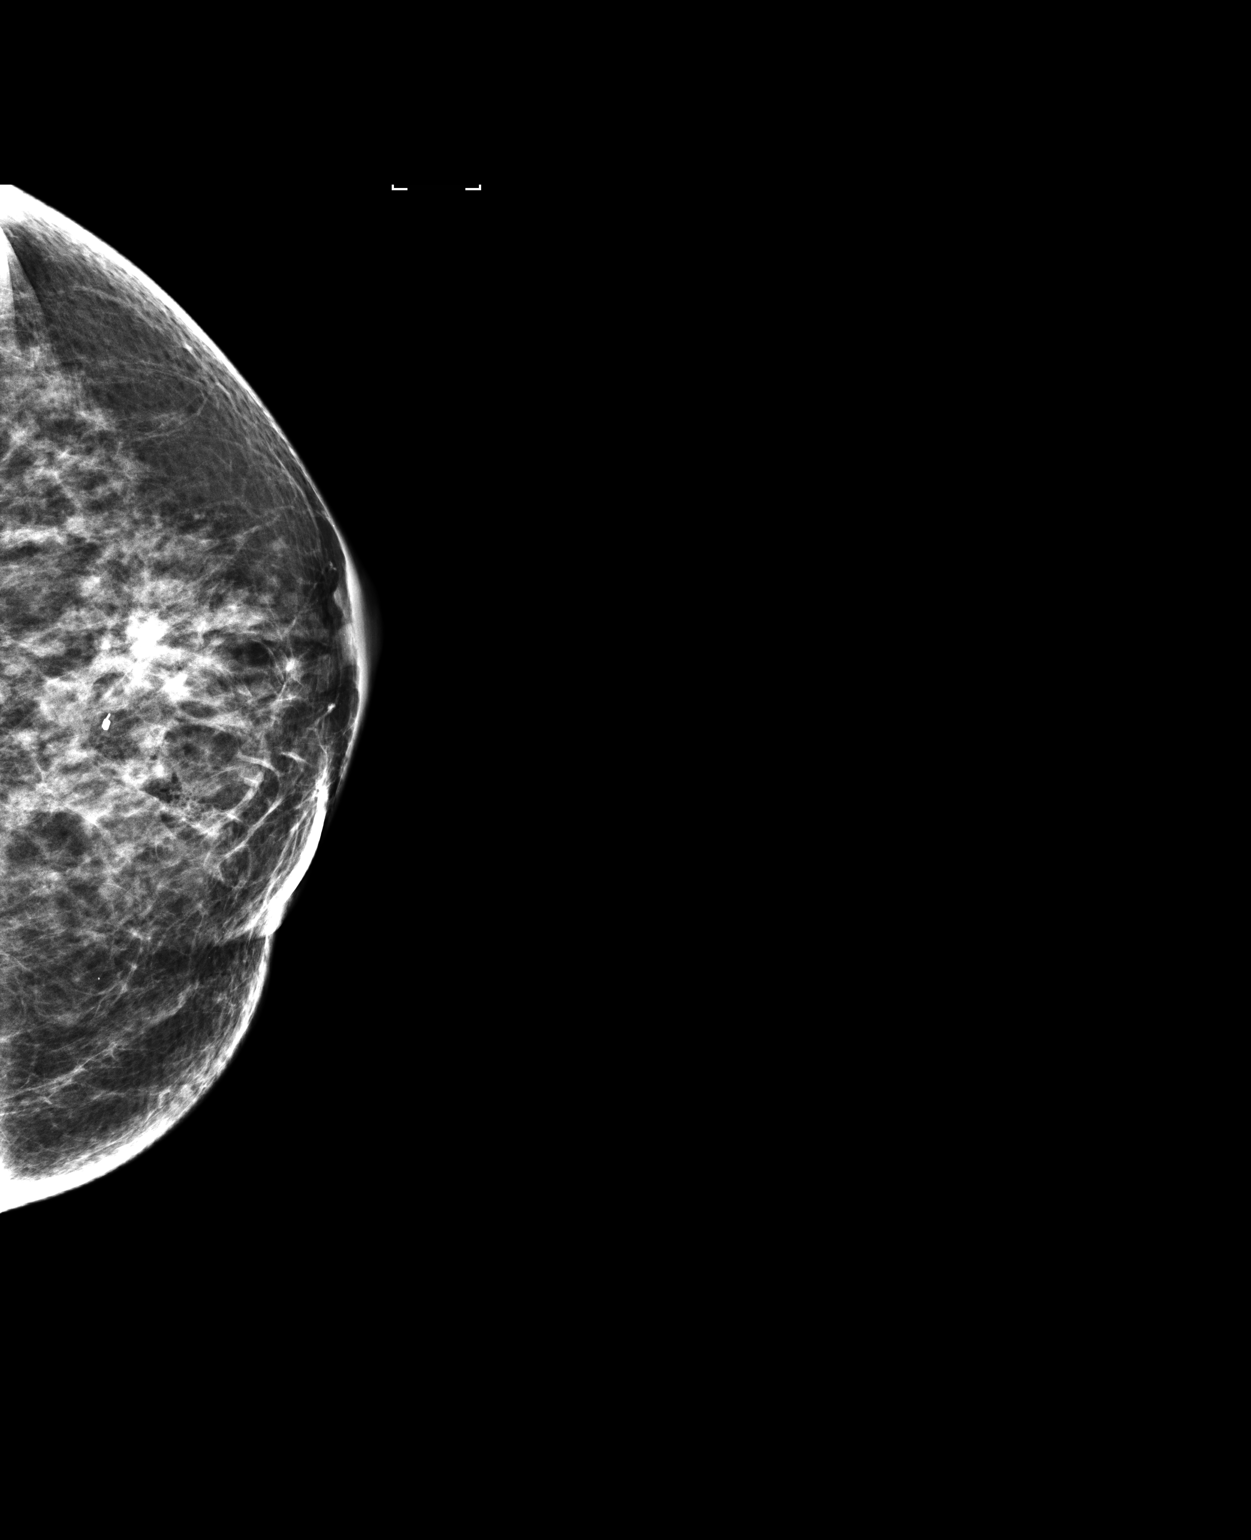

[L ML]
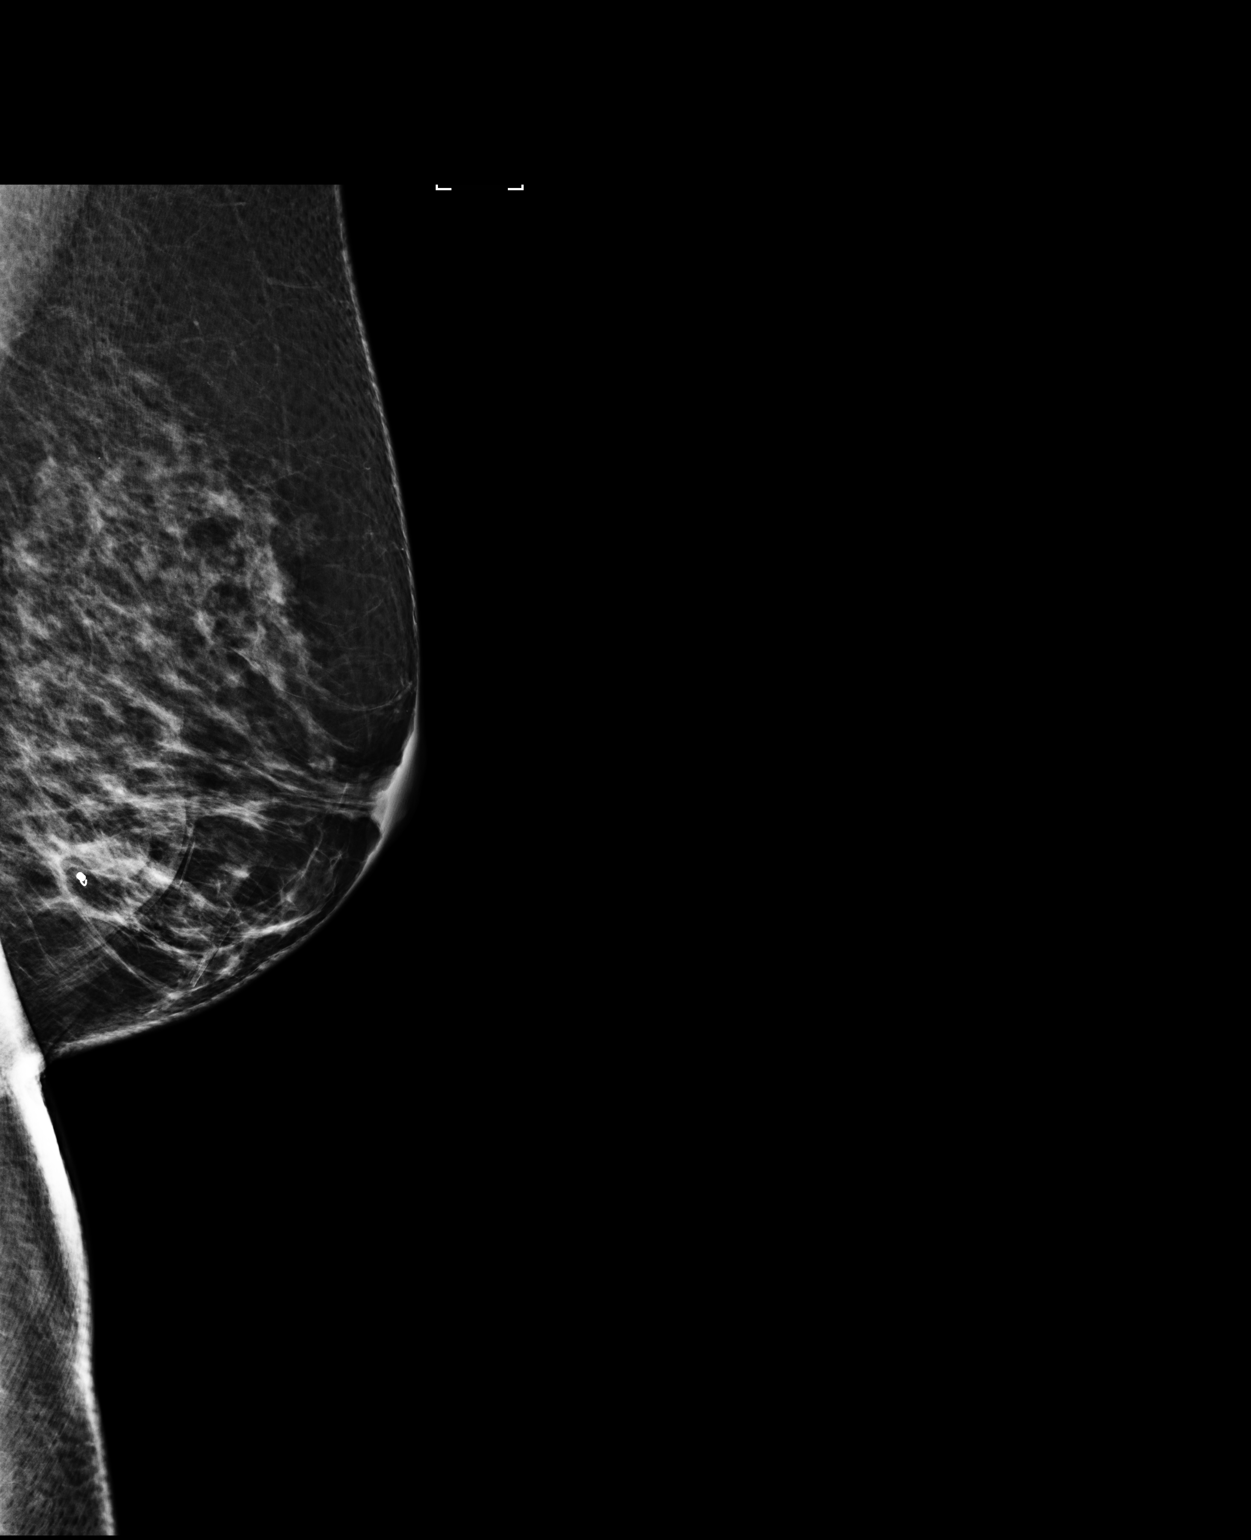

[2 of 2 positions shown; findings below may reference images not displayed]

FINDINGS: Mammographic images were obtained following ultrasound guided biopsy
of a mass in the 7 o'clock position of the left breast. A coil
shaped biopsy clip is satisfactorily positioned within the lower
inner quadrant of the left breast.
IMPRESSION: Satisfactory position of coil shaped biopsy clip in the left breast.

Final Assessment: Post Procedure Mammograms for Marker Placement

## 2016-09-26 DIAGNOSIS — L03113 Cellulitis of right upper limb: Secondary | ICD-10-CM | POA: Diagnosis not present

## 2016-09-26 DIAGNOSIS — K7460 Unspecified cirrhosis of liver: Secondary | ICD-10-CM | POA: Diagnosis not present

## 2016-10-08 DIAGNOSIS — I10 Essential (primary) hypertension: Secondary | ICD-10-CM | POA: Diagnosis not present

## 2016-10-08 DIAGNOSIS — I2510 Atherosclerotic heart disease of native coronary artery without angina pectoris: Secondary | ICD-10-CM | POA: Diagnosis not present

## 2016-10-08 DIAGNOSIS — I270 Primary pulmonary hypertension: Secondary | ICD-10-CM | POA: Diagnosis not present

## 2016-10-08 DIAGNOSIS — Z955 Presence of coronary angioplasty implant and graft: Secondary | ICD-10-CM | POA: Diagnosis not present

## 2016-10-08 DIAGNOSIS — K3189 Other diseases of stomach and duodenum: Secondary | ICD-10-CM | POA: Diagnosis not present

## 2016-10-08 DIAGNOSIS — D696 Thrombocytopenia, unspecified: Secondary | ICD-10-CM | POA: Diagnosis not present

## 2016-10-08 DIAGNOSIS — Z87891 Personal history of nicotine dependence: Secondary | ICD-10-CM | POA: Diagnosis not present

## 2016-10-08 DIAGNOSIS — M419 Scoliosis, unspecified: Secondary | ICD-10-CM | POA: Diagnosis not present

## 2016-10-08 DIAGNOSIS — I8510 Secondary esophageal varices without bleeding: Secondary | ICD-10-CM | POA: Diagnosis not present

## 2016-10-08 DIAGNOSIS — N8110 Cystocele, unspecified: Secondary | ICD-10-CM | POA: Diagnosis not present

## 2016-10-08 DIAGNOSIS — M1990 Unspecified osteoarthritis, unspecified site: Secondary | ICD-10-CM | POA: Diagnosis not present

## 2016-10-08 DIAGNOSIS — I8500 Esophageal varices without bleeding: Secondary | ICD-10-CM | POA: Diagnosis not present

## 2016-10-08 DIAGNOSIS — K7460 Unspecified cirrhosis of liver: Secondary | ICD-10-CM | POA: Diagnosis not present

## 2016-10-08 DIAGNOSIS — K766 Portal hypertension: Secondary | ICD-10-CM | POA: Diagnosis not present

## 2016-10-08 DIAGNOSIS — R69 Illness, unspecified: Secondary | ICD-10-CM | POA: Diagnosis not present

## 2016-10-08 DIAGNOSIS — I252 Old myocardial infarction: Secondary | ICD-10-CM | POA: Diagnosis not present

## 2016-10-10 DIAGNOSIS — H26492 Other secondary cataract, left eye: Secondary | ICD-10-CM | POA: Diagnosis not present

## 2016-10-11 ENCOUNTER — Other Ambulatory Visit: Payer: Self-pay | Admitting: Family Medicine

## 2016-10-11 DIAGNOSIS — Z1231 Encounter for screening mammogram for malignant neoplasm of breast: Secondary | ICD-10-CM

## 2016-10-17 DIAGNOSIS — I8510 Secondary esophageal varices without bleeding: Secondary | ICD-10-CM | POA: Diagnosis not present

## 2016-10-17 DIAGNOSIS — I10 Essential (primary) hypertension: Secondary | ICD-10-CM | POA: Diagnosis not present

## 2016-10-17 DIAGNOSIS — R42 Dizziness and giddiness: Secondary | ICD-10-CM | POA: Diagnosis not present

## 2016-10-17 DIAGNOSIS — K7460 Unspecified cirrhosis of liver: Secondary | ICD-10-CM | POA: Diagnosis not present

## 2016-10-29 DIAGNOSIS — I2510 Atherosclerotic heart disease of native coronary artery without angina pectoris: Secondary | ICD-10-CM | POA: Diagnosis not present

## 2016-10-29 DIAGNOSIS — I10 Essential (primary) hypertension: Secondary | ICD-10-CM | POA: Diagnosis not present

## 2016-10-29 DIAGNOSIS — E782 Mixed hyperlipidemia: Secondary | ICD-10-CM | POA: Diagnosis not present

## 2016-10-29 DIAGNOSIS — R42 Dizziness and giddiness: Secondary | ICD-10-CM | POA: Diagnosis not present

## 2016-10-29 DIAGNOSIS — I071 Rheumatic tricuspid insufficiency: Secondary | ICD-10-CM | POA: Diagnosis not present

## 2016-10-29 DIAGNOSIS — K219 Gastro-esophageal reflux disease without esophagitis: Secondary | ICD-10-CM | POA: Diagnosis not present

## 2016-10-29 DIAGNOSIS — I6523 Occlusion and stenosis of bilateral carotid arteries: Secondary | ICD-10-CM | POA: Diagnosis not present

## 2016-11-01 DIAGNOSIS — R42 Dizziness and giddiness: Secondary | ICD-10-CM | POA: Diagnosis not present

## 2016-11-06 ENCOUNTER — Ambulatory Visit
Admission: RE | Admit: 2016-11-06 | Discharge: 2016-11-06 | Disposition: A | Discharge status: Home / Self Care | Payer: Medicare HMO | Source: Ambulatory Visit | Attending: Family Medicine | Admitting: Family Medicine

## 2016-11-06 DIAGNOSIS — Z1231 Encounter for screening mammogram for malignant neoplasm of breast: Secondary | ICD-10-CM | POA: Insufficient documentation

## 2016-11-06 DIAGNOSIS — I10 Essential (primary) hypertension: Secondary | ICD-10-CM | POA: Diagnosis not present

## 2016-11-06 DIAGNOSIS — R001 Bradycardia, unspecified: Secondary | ICD-10-CM | POA: Insufficient documentation

## 2016-11-06 DIAGNOSIS — I2510 Atherosclerotic heart disease of native coronary artery without angina pectoris: Secondary | ICD-10-CM | POA: Diagnosis not present

## 2016-11-06 DIAGNOSIS — E782 Mixed hyperlipidemia: Secondary | ICD-10-CM | POA: Diagnosis not present

## 2016-12-06 DIAGNOSIS — Z23 Encounter for immunization: Secondary | ICD-10-CM | POA: Diagnosis not present

## 2016-12-06 DIAGNOSIS — K7581 Nonalcoholic steatohepatitis (NASH): Secondary | ICD-10-CM | POA: Diagnosis not present

## 2016-12-06 DIAGNOSIS — K766 Portal hypertension: Secondary | ICD-10-CM | POA: Diagnosis not present

## 2016-12-06 DIAGNOSIS — Z87891 Personal history of nicotine dependence: Secondary | ICD-10-CM | POA: Diagnosis not present

## 2016-12-06 DIAGNOSIS — G9340 Encephalopathy, unspecified: Secondary | ICD-10-CM | POA: Diagnosis not present

## 2016-12-06 DIAGNOSIS — R188 Other ascites: Secondary | ICD-10-CM | POA: Diagnosis not present

## 2016-12-06 DIAGNOSIS — I2510 Atherosclerotic heart disease of native coronary artery without angina pectoris: Secondary | ICD-10-CM | POA: Diagnosis not present

## 2016-12-06 DIAGNOSIS — R69 Illness, unspecified: Secondary | ICD-10-CM | POA: Diagnosis not present

## 2016-12-06 DIAGNOSIS — K7460 Unspecified cirrhosis of liver: Secondary | ICD-10-CM | POA: Diagnosis not present

## 2017-01-09 DIAGNOSIS — I10 Essential (primary) hypertension: Secondary | ICD-10-CM | POA: Diagnosis not present

## 2017-01-09 DIAGNOSIS — H5213 Myopia, bilateral: Secondary | ICD-10-CM | POA: Diagnosis not present

## 2017-01-09 DIAGNOSIS — H43392 Other vitreous opacities, left eye: Secondary | ICD-10-CM | POA: Diagnosis not present

## 2017-01-09 DIAGNOSIS — H40013 Open angle with borderline findings, low risk, bilateral: Secondary | ICD-10-CM | POA: Diagnosis not present

## 2017-01-09 DIAGNOSIS — H524 Presbyopia: Secondary | ICD-10-CM | POA: Diagnosis not present

## 2017-01-09 DIAGNOSIS — Z961 Presence of intraocular lens: Secondary | ICD-10-CM | POA: Diagnosis not present

## 2017-01-09 DIAGNOSIS — H52223 Regular astigmatism, bilateral: Secondary | ICD-10-CM | POA: Diagnosis not present

## 2017-03-11 DIAGNOSIS — I6523 Occlusion and stenosis of bilateral carotid arteries: Secondary | ICD-10-CM | POA: Diagnosis not present

## 2017-03-11 DIAGNOSIS — I071 Rheumatic tricuspid insufficiency: Secondary | ICD-10-CM | POA: Diagnosis not present

## 2017-03-11 DIAGNOSIS — R001 Bradycardia, unspecified: Secondary | ICD-10-CM | POA: Diagnosis not present

## 2017-03-11 DIAGNOSIS — K7460 Unspecified cirrhosis of liver: Secondary | ICD-10-CM | POA: Diagnosis not present

## 2017-03-11 DIAGNOSIS — E782 Mixed hyperlipidemia: Secondary | ICD-10-CM | POA: Diagnosis not present

## 2017-03-11 DIAGNOSIS — I10 Essential (primary) hypertension: Secondary | ICD-10-CM | POA: Diagnosis not present

## 2017-03-11 DIAGNOSIS — I2510 Atherosclerotic heart disease of native coronary artery without angina pectoris: Secondary | ICD-10-CM | POA: Diagnosis not present

## 2017-03-19 DIAGNOSIS — H43392 Other vitreous opacities, left eye: Secondary | ICD-10-CM | POA: Diagnosis not present

## 2017-03-19 DIAGNOSIS — H524 Presbyopia: Secondary | ICD-10-CM | POA: Diagnosis not present

## 2017-03-19 DIAGNOSIS — H52223 Regular astigmatism, bilateral: Secondary | ICD-10-CM | POA: Diagnosis not present

## 2017-03-19 DIAGNOSIS — Z961 Presence of intraocular lens: Secondary | ICD-10-CM | POA: Diagnosis not present

## 2017-03-19 DIAGNOSIS — H1089 Other conjunctivitis: Secondary | ICD-10-CM | POA: Diagnosis not present

## 2017-03-19 DIAGNOSIS — H40013 Open angle with borderline findings, low risk, bilateral: Secondary | ICD-10-CM | POA: Diagnosis not present

## 2017-03-19 DIAGNOSIS — I10 Essential (primary) hypertension: Secondary | ICD-10-CM | POA: Diagnosis not present

## 2017-03-19 DIAGNOSIS — H5213 Myopia, bilateral: Secondary | ICD-10-CM | POA: Diagnosis not present

## 2017-04-09 DIAGNOSIS — H539 Unspecified visual disturbance: Secondary | ICD-10-CM | POA: Diagnosis not present

## 2017-04-09 DIAGNOSIS — H401121 Primary open-angle glaucoma, left eye, mild stage: Secondary | ICD-10-CM | POA: Diagnosis not present

## 2017-04-16 DIAGNOSIS — I10 Essential (primary) hypertension: Secondary | ICD-10-CM | POA: Diagnosis not present

## 2017-04-16 DIAGNOSIS — H40013 Open angle with borderline findings, low risk, bilateral: Secondary | ICD-10-CM | POA: Diagnosis not present

## 2017-04-16 DIAGNOSIS — H524 Presbyopia: Secondary | ICD-10-CM | POA: Diagnosis not present

## 2017-04-16 DIAGNOSIS — H1089 Other conjunctivitis: Secondary | ICD-10-CM | POA: Diagnosis not present

## 2017-04-16 DIAGNOSIS — H52223 Regular astigmatism, bilateral: Secondary | ICD-10-CM | POA: Diagnosis not present

## 2017-04-16 DIAGNOSIS — H5213 Myopia, bilateral: Secondary | ICD-10-CM | POA: Diagnosis not present

## 2017-04-16 DIAGNOSIS — H1132 Conjunctival hemorrhage, left eye: Secondary | ICD-10-CM | POA: Diagnosis not present

## 2017-04-16 DIAGNOSIS — H43392 Other vitreous opacities, left eye: Secondary | ICD-10-CM | POA: Diagnosis not present

## 2017-04-16 DIAGNOSIS — Z961 Presence of intraocular lens: Secondary | ICD-10-CM | POA: Diagnosis not present

## 2017-04-23 DIAGNOSIS — H35363 Drusen (degenerative) of macula, bilateral: Secondary | ICD-10-CM | POA: Diagnosis not present

## 2017-04-23 DIAGNOSIS — H04123 Dry eye syndrome of bilateral lacrimal glands: Secondary | ICD-10-CM | POA: Diagnosis not present

## 2017-04-23 DIAGNOSIS — Z961 Presence of intraocular lens: Secondary | ICD-10-CM | POA: Diagnosis not present

## 2017-04-23 DIAGNOSIS — H40013 Open angle with borderline findings, low risk, bilateral: Secondary | ICD-10-CM | POA: Diagnosis not present

## 2017-04-23 DIAGNOSIS — H5319 Other subjective visual disturbances: Secondary | ICD-10-CM | POA: Diagnosis not present

## 2017-05-08 DIAGNOSIS — E782 Mixed hyperlipidemia: Secondary | ICD-10-CM | POA: Diagnosis not present

## 2017-05-08 LAB — LIPID PANEL
Cholesterol: 167 (ref 0–200)
HDL: 68 (ref 35–70)
LDL Cholesterol: 86
Triglycerides: 60 (ref 40–160)

## 2017-06-11 DIAGNOSIS — Z87891 Personal history of nicotine dependence: Secondary | ICD-10-CM | POA: Diagnosis not present

## 2017-06-11 DIAGNOSIS — K766 Portal hypertension: Secondary | ICD-10-CM | POA: Diagnosis not present

## 2017-06-11 DIAGNOSIS — G9340 Encephalopathy, unspecified: Secondary | ICD-10-CM | POA: Diagnosis not present

## 2017-06-11 DIAGNOSIS — R188 Other ascites: Secondary | ICD-10-CM | POA: Diagnosis not present

## 2017-06-11 DIAGNOSIS — K7460 Unspecified cirrhosis of liver: Secondary | ICD-10-CM | POA: Diagnosis not present

## 2017-06-11 DIAGNOSIS — K7581 Nonalcoholic steatohepatitis (NASH): Secondary | ICD-10-CM | POA: Diagnosis not present

## 2017-06-11 DIAGNOSIS — I8390 Asymptomatic varicose veins of unspecified lower extremity: Secondary | ICD-10-CM | POA: Diagnosis not present

## 2017-06-11 DIAGNOSIS — L299 Pruritus, unspecified: Secondary | ICD-10-CM | POA: Diagnosis not present

## 2017-06-11 LAB — CBC AND DIFFERENTIAL
HCT: 37 (ref 36–46)
Hemoglobin: 12.6 (ref 12.0–16.0)
Platelets: 84 — AB (ref 150–399)
WBC: 7.4

## 2017-06-11 LAB — HEPATIC FUNCTION PANEL
ALK PHOS: 124 (ref 25–125)
ALT: 23 (ref 7–35)
AST: 54 — AB (ref 13–35)
BILIRUBIN, TOTAL: 3.1

## 2017-06-11 LAB — PROTIME-INR: Protime: 14.5 — AB (ref 10.0–13.8)

## 2017-06-11 LAB — POCT INR: INR: 1.3 — AB (ref <=1.1)

## 2017-06-17 DIAGNOSIS — K7460 Unspecified cirrhosis of liver: Secondary | ICD-10-CM | POA: Diagnosis not present

## 2017-06-17 LAB — BASIC METABOLIC PANEL
BUN: 8 (ref 4–21)
Creatinine: 0.9 (ref 0.5–1.1)
GLUCOSE: 111
Potassium: 4.1 (ref 3.4–5.3)
SODIUM: 134 — AB (ref 137–147)

## 2017-07-10 DIAGNOSIS — I10 Essential (primary) hypertension: Secondary | ICD-10-CM | POA: Diagnosis not present

## 2017-07-10 DIAGNOSIS — I6523 Occlusion and stenosis of bilateral carotid arteries: Secondary | ICD-10-CM | POA: Diagnosis not present

## 2017-07-10 DIAGNOSIS — I2510 Atherosclerotic heart disease of native coronary artery without angina pectoris: Secondary | ICD-10-CM | POA: Diagnosis not present

## 2017-07-10 DIAGNOSIS — I071 Rheumatic tricuspid insufficiency: Secondary | ICD-10-CM | POA: Diagnosis not present

## 2017-07-10 DIAGNOSIS — E782 Mixed hyperlipidemia: Secondary | ICD-10-CM | POA: Diagnosis not present

## 2017-07-11 ENCOUNTER — Ambulatory Visit (INDEPENDENT_AMBULATORY_CARE_PROVIDER_SITE_OTHER): Payer: Medicare HMO | Admitting: Family Medicine

## 2017-07-11 ENCOUNTER — Encounter: Payer: Self-pay | Admitting: Family Medicine

## 2017-07-11 VITALS — BP 134/74 | HR 65 | Temp 97.4°F | Resp 16 | Ht 62.0 in | Wt 159.0 lb

## 2017-07-11 DIAGNOSIS — I2510 Atherosclerotic heart disease of native coronary artery without angina pectoris: Secondary | ICD-10-CM

## 2017-07-11 DIAGNOSIS — I252 Old myocardial infarction: Secondary | ICD-10-CM | POA: Diagnosis not present

## 2017-07-11 DIAGNOSIS — I10 Essential (primary) hypertension: Secondary | ICD-10-CM

## 2017-07-11 DIAGNOSIS — M5030 Other cervical disc degeneration, unspecified cervical region: Secondary | ICD-10-CM | POA: Diagnosis not present

## 2017-07-11 DIAGNOSIS — E782 Mixed hyperlipidemia: Secondary | ICD-10-CM

## 2017-07-11 DIAGNOSIS — I071 Rheumatic tricuspid insufficiency: Secondary | ICD-10-CM | POA: Diagnosis not present

## 2017-07-11 DIAGNOSIS — K7460 Unspecified cirrhosis of liver: Secondary | ICD-10-CM

## 2017-07-11 DIAGNOSIS — K219 Gastro-esophageal reflux disease without esophagitis: Secondary | ICD-10-CM

## 2017-07-11 NOTE — Assessment & Plan Note (Signed)
Followed by [facility] Considering 2nd opinion at [facility] Encouraged patient to take her medications as prescribed Discussed with patient why over-the-counter laxatives would not have the same effect on her ammonia levels and confusion as the lactulose does Advised her that if she truly wants a second opinion, she can let me know if she wants a referral, but I do think she is getting good care liver clinic Does have some edema and mild distention of her abdomen without clear ascites Her spironolactone dose was increased yesterday by cardiology, so will not change this today Could consider addition of some Lasix with the spironolactone

## 2017-07-11 NOTE — Assessment & Plan Note (Signed)
Being followed by neurosurgery Status post cervical fusion No radicular symptoms currently

## 2017-07-11 NOTE — Assessment & Plan Note (Signed)
Well-controlled on daily PPI, which she can continue at this point Followed by [facility] for the lowest dose for the shortest amount of time with PPI therapy

## 2017-07-11 NOTE — Progress Notes (Signed)
Patient: Shannon Sawyer, Female    DOB: [birth year], 72 y.o.   MRN: [id number] Visit Date: 07/11/2017  Today's Provider: Lavon Paganini, MD   Chief Complaint  Patient presents with  . Establish Care   Subjective:    Establish Care Shannon Sawyer is a 72 y.o. female who presents today to establish care. She feels fairly well. Pt states she has liver disease, that causes HTN and swelling. She states this is worsening. She reports exercising none due to severe back issues. She reports she is sleeping poorly. This is due to "frustration" of medical issues.  Pt had a heart attack 5 weeks after her son died of liver cancer in 2011/06/25. She also has tricuspid insufficiency, HTN, bilateral carotid artery disease, CAD s/p 2 stent placement.  This is F/B Dr. Nehemiah Massed.  States that she saw him last week.  She is concerned about LE edema (b/l), but she discussed with Dr. Nehemiah Massed who went up on diuretics and advised her that this is related to portal hypertension.  Taking nadolol, losartan, lovastatin, ASA, nitro prn.  Cirrhosis (nonalcoholic) with portal hypertension:  Has h/o paracentesis for ascites x1.  Taking lactulose, nadolol, and spironolactone.  Followed by Dr. Edison Pace ([facility]).  States that she does not like to take lactulose due to upset stomach.  She knows that she gets more confused when she does not take it.  She has known varices and is taking beta blocker.  She feels confused about the cirrhosis and treatment.  She is thinking about talking to [place] for a second opinion and seeing if she would be a candidate for transplant.  GERD: Taking omeprazole daily.  If misses 2 doses will notice worsening burning in esophagus.  Does not eat spicy foods.  H/o anxiety and depression: Denies any depression symptoms currently.  Thinks she is just frustrated with medical issues.  Has panic attacks with CT/MRI, on escalators, on airplanes  Cervical DJD: s/p anterior cervical decomp/discectomy  fusion in 06/25/2014 by Neurosurgeon in [place].  Now with lower back pain.  She is s/p ESI.  Plans on going  -----------------------------------------------------------------    Review of Systems  Constitutional: Positive for chills, fatigue and unexpected weight change. Negative for activity change, appetite change, diaphoresis and fever.  HENT: Positive for hearing loss. Negative for congestion, dental problem, drooling, ear discharge, ear pain, facial swelling, mouth sores, nosebleeds, postnasal drip, rhinorrhea, sinus pressure, sinus pain, sneezing, sore throat, tinnitus, trouble swallowing and voice change.   Eyes: Positive for itching. Negative for photophobia, pain, discharge, redness and visual disturbance.  Respiratory: Negative.   Cardiovascular: Negative.   Gastrointestinal: Positive for abdominal distention and abdominal pain. Negative for anal bleeding, blood in stool, constipation, diarrhea, nausea, rectal pain and vomiting.  Endocrine: Positive for cold intolerance. Negative for heat intolerance, polydipsia, polyphagia and polyuria.  Genitourinary: Negative.   Musculoskeletal: Positive for back pain and neck pain. Negative for arthralgias, gait problem, joint swelling, myalgias and neck stiffness.  Skin: Negative.   Allergic/Immunologic: Negative.   Neurological: Positive for weakness and light-headedness. Negative for dizziness, tremors, seizures, syncope, facial asymmetry, speech difficulty, numbness and headaches.  Hematological: Negative.   Psychiatric/Behavioral: Negative.     Social History      She  reports that she has quit smoking. Her smoking use included cigarettes. She has never used smokeless tobacco. She reports that she does not drink alcohol or use drugs.       Social History  Socioeconomic History  . Marital status: Married    Spouse name: Sonia Side  . Number of children: 3  . Years of education: Not on file  . Highest education level: Not on file    Occupational History  . 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 Financial resource strain: Not on file  . Food insecurity:    Worry: Not on file    Inability: Not on file  . Transportation needs:    Medical: Not on file    Non-medical: Not on file  Tobacco Use  . Smoking status: Former Smoker    Types: Cigarettes  . Smokeless tobacco: Never Used  . Tobacco comment: formerly tried smoking cigarettes, no regular use  Substance and Sexual Activity  . Alcohol use: No  . Drug use: No  . Sexual activity: Yes    Partners: Male    Birth control/protection: Post-menopausal, Surgical  Lifestyle  . Physical activity:    Days per week: Not on file    Minutes per session: Not on file  . Stress: Not on file  Relationships  . Social connections:    Talks on phone: Not on file    Gets together: Not on file    Attends religious service: Not on file    Active member of club or organization: Not on file    Attends meetings of clubs or organizations: Not on file    Relationship status: Not on file  Other Topics Concern  . Not on file  Social History Narrative   One child was stillborn, and one son died of liver cancer. Pt has one living biological child, and 2 stepchildren.    Past Medical History:  Diagnosis Date  . Allergy   . Anemia    LOW PLATELET COUNTS  ([facility])  . Anxiety   . Carotid artery disease (HCC)    BILAT.        Marland Kitchen Chronic cough   . Cirrhosis, nonalcoholic (HCC)    PORTAL HTN, FATTY TISSUE   . Coronary artery disease   . DDD (degenerative disc disease), cervical    SPONDYLOLISTHESIS  . Depression   . GERD (gastroesophageal reflux disease)   . Headache    HX OCCULAR MIGRAINES  . Hypertension    ESSENTIAL  . Myocardial infarction ([place])   . Shortness of breath dyspnea    WITH EXERTION   . Tricuspid insufficiency    MODERATE      Patient Active Problem List   Diagnosis Date Noted  . H/O acute myocardial infarction  07/11/2017  . GERD (gastroesophageal reflux disease) 07/11/2017  . Cervical radiculopathy 06/03/2014  . TI (tricuspid incompetence) 02/12/2014  . Carotid artery narrowing 01/12/2014  . Cirrhosis of liver not due to alcohol ([place]) 01/12/2014  . Anemia, iron deficiency 01/12/2014  . Disease of liver 01/12/2014  . Breathlessness on exertion 01/12/2014  . Cervical radiculitis 11/04/2013  . DDD (degenerative disc disease), cervical 11/04/2013  . SPL (spondylolisthesis) 11/04/2013  . Arteriosclerosis of coronary artery 05/20/2013  . Essential (primary) hypertension 05/20/2013  . Combined fat and carbohydrate induced hyperlipemia 05/20/2013    Past Surgical History:  Procedure Laterality Date  . ABDOMINAL HYSTERECTOMY    . ANTERIOR CERVICAL DECOMP/DISCECTOMY FUSION N/A 06/03/2014   Procedure: CERVICAL FIVE-CERVICAL SIX, CERVICAL SIX-CERVICAL SEVEN ANTERIOR CERVICAL DECOMPRESSION/DISCECTOMY ;  Surgeon: Karie Chimera, MD;  Location: [place] [facility];  Service: Neurosurgery;  Laterality: N/A;  . BREAST BIOPSY Left 09/02/2015   benign  .  BREAST CYST ASPIRATION Left    neg  . CARDIAC CATHETERIZATION     2 STENTS 2013 IN [place]  . cardiac stents    . ESOPHAGOGASTRODUODENOSCOPY (EGD) WITH PROPOFOL N/A 02/21/2015   Procedure: ESOPHAGOGASTRODUODENOSCOPY (EGD) WITH PROPOFOL;  Surgeon: Josefine Class, MD;  Location: [facility];  Service: Endoscopy;  Laterality: N/A;  . EYE SURGERY      Family History        Family Status  Relation Name Status  . Mother  Deceased  . Father  Deceased  . Brother  (Not Specified)  . Son  Deceased  . Neg Hx  (Not Specified)        Her family history includes COPD in her brother and father; Liver cancer in her mother and son; Lymphoma in her father. There is no history of Breast cancer or Colon cancer.      Allergies  Allergen Reactions  . Contrast Media  [Iodinated Diagnostic Agents] Rash    Blister rash   . Azelastine Swelling  . Flonase  [Fluticasone Propionate] Swelling  . Prednisone Swelling  . Shellfish Allergy Swelling and Other (See Comments)    Per spouse, determined on allergy test.  . Tramadol Nausea Only     Current Outpatient Medications:  .  acetaminophen (TYLENOL) 500 MG tablet, Take by mouth., Disp: , Rfl:  .  aspirin EC 81 MG tablet, Take by mouth., Disp: , Rfl:  .  lactulose (CHRONULAC) 10 GM/15ML solution, Take 20 g by mouth 2 (two) times daily., Disp: , Rfl:  .  loratadine (CLARITIN) 10 MG tablet, Take 10 mg by mouth daily as needed for allergies., Disp: , Rfl:  .  losartan (COZAAR) 25 MG tablet, Take 25 mg by mouth daily., Disp: , Rfl:  .  lovastatin (MEVACOR) 40 MG tablet, Take 40 mg by mouth every evening. TAKE ONE TABLET BY MOUTH ONCE DAILY WITH DINNER, Disp: , Rfl:  .  nadolol (CORGARD) 20 MG tablet, Take by mouth., Disp: , Rfl:  .  nitroGLYCERIN (NITROSTAT) 0.4 MG SL tablet, Place 0.4 mg under the tongue every 5 (five) minutes as needed for chest pain. , Disp: , Rfl:  .  omeprazole (PRILOSEC) 20 MG capsule, Take 20 mg by mouth daily. , Disp: , Rfl:  .  spironolactone (ALDACTONE) 25 MG tablet, Take by mouth., Disp: , Rfl:  .  zinc sulfate 220 (50 Zn) MG capsule, Take by mouth., Disp: , Rfl:    Patient Care Team: Gayland Curry, MD as PCP - General (Family Medicine)      Objective:   Vitals: BP 134/74 (BP Location: Left Arm, Patient Position: Sitting, Cuff Size: Normal)   Pulse 65   Temp (!) 97.4 F (36.3 C) (Oral)   Resp 16   Ht 5\' 2"  (1.575 m)   Wt 159 lb (72.1 kg)   SpO2 99%   BMI 29.08 kg/m    Vitals:   07/11/17 0913  BP: 134/74  Pulse: 65  Resp: 16  Temp: (!) 97.4 F (36.3 C)  TempSrc: Oral  SpO2: 99%  Weight: 159 lb (72.1 kg)  Height: 5\' 2"  (1.575 m)     Physical Exam  Constitutional: She is oriented to person, place, and time. She appears well-developed and well-nourished. No distress.  HENT:  Head: Normocephalic and atraumatic.  Right Ear: External ear normal.    Left Ear: External ear normal.  Nose: Nose normal.  Mouth/Throat: Oropharynx is clear and moist.  Eyes: Pupils are equal, round, and  reactive to light. Conjunctivae and EOM are normal. No scleral icterus.  Neck: Neck supple. No thyromegaly present.  Cardiovascular: Normal rate, regular rhythm, normal heart sounds and intact distal pulses.  No murmur heard. Pulmonary/Chest: Breath sounds normal. No respiratory distress. She has no wheezes. She has no rales.  Abdominal: Soft. Bowel sounds are normal. She exhibits distension (mild). There is no tenderness. There is no rebound and no guarding.  Musculoskeletal: She exhibits edema (b/l LEs to knees). She exhibits no deformity.  Lymphadenopathy:    She has no cervical adenopathy.  Neurological: She is alert and oriented to person, place, and time.  Skin: Skin is warm and dry. Capillary refill takes less than 2 seconds. No rash noted.  Psychiatric: She has a normal mood and affect. Her behavior is normal.  Vitals reviewed.    Depression Screen PHQ 2/9 Scores 07/11/2017  PHQ - 2 Score 1  PHQ- 9 Score 8     Assessment & Plan:    Problem List Items Addressed This Visit      Cardiovascular and Mediastinum   Arteriosclerosis of coronary artery    S/p MI and 2 stent placement of LAD in 2013 Followed by cardiology On beta blocker, ACEi, ASA, and statin Asymptomatic       Relevant Medications   aspirin EC 81 MG tablet   nadolol (CORGARD) 20 MG tablet   spironolactone (ALDACTONE) 25 MG tablet   Essential (primary) hypertension    Well controlled today Continue current medications Also followed by Cardiology      Relevant Medications   aspirin EC 81 MG tablet   nadolol (CORGARD) 20 MG tablet   spironolactone (ALDACTONE) 25 MG tablet   TI (tricuspid incompetence)    Followed by cardiology Currently asymptomatic      Relevant Medications   aspirin EC 81 MG tablet   nadolol (CORGARD) 20 MG tablet   spironolactone (ALDACTONE)  25 MG tablet     Digestive   Cirrhosis of liver not due to alcohol ([place]) - Primary    Followed by [facility] Considering 2nd opinion at [facility] Encouraged patient to take her medications as prescribed Discussed with patient why over-the-counter laxatives would not have the same effect on her ammonia levels and confusion as the lactulose does Advised her that if she truly wants a second opinion, she can let me know if she wants a referral, but I do think she is getting good care liver clinic Does have some edema and mild distention of her abdomen without clear ascites Her spironolactone dose was increased yesterday by cardiology, so will not change this today Could consider addition of some Lasix with the spironolactone      GERD (gastroesophageal reflux disease)    Well-controlled on daily PPI, which she can continue at this point Followed by [facility] for the lowest dose for the shortest amount of time with PPI therapy        Musculoskeletal and Integument   DDD (degenerative disc disease), cervical    Being followed by neurosurgery Status post cervical fusion No radicular symptoms currently      Relevant Medications   aspirin EC 81 MG tablet   acetaminophen (TYLENOL) 500 MG tablet     Other   Combined fat and carbohydrate induced hyperlipemia    Continue lovastatin      Relevant Medications   aspirin EC 81 MG tablet   nadolol (CORGARD) 20 MG tablet   spironolactone (ALDACTONE) 25 MG tablet   H/O  acute myocardial infarction    Followed by cardiology On appropriate medication and asymptomatic Continue to monitor         Recent lab work [facility] was reviewed, including CMP, CBC, lipid panel, A1c, PT/INR.  These were abstracted into the patient's chart.   Return in about 2 weeks (around 07/25/2017) for AWV/CPE.   Addressed extensive list of chronic and acute medical problems today requiring extensive time in counseling and coordination of care.  Over half of this 45  minute visit were spent in counseling and coordinating care of multiple medical problems.   The entirety of the information documented in the History of Present Illness, Review of Systems and Physical Exam were personally obtained by me. Portions of this information were initially documented by Raquel Sarna Ratchford, CMA and reviewed by me for thoroughness and accuracy.    Virginia Crews, MD, MPH [facility] 07/11/2017 11:07 AM

## 2017-07-11 NOTE — Assessment & Plan Note (Signed)
S/p MI and 2 stent placement of LAD in 2013 Followed by cardiology On beta blocker, ACEi, ASA, and statin Asymptomatic

## 2017-07-11 NOTE — Assessment & Plan Note (Signed)
Followed by cardiology On appropriate medication and asymptomatic Continue to monitor

## 2017-07-11 NOTE — Assessment & Plan Note (Signed)
Well controlled today Continue current medications Also followed by Cardiology

## 2017-07-11 NOTE — Assessment & Plan Note (Signed)
Continue lovastatin 

## 2017-07-11 NOTE — Assessment & Plan Note (Signed)
Followed by cardiology Currently asymptomatic

## 2017-07-12 ENCOUNTER — Telehealth: Payer: Self-pay | Admitting: Family Medicine

## 2017-07-12 DIAGNOSIS — K7460 Unspecified cirrhosis of liver: Secondary | ICD-10-CM

## 2017-07-12 NOTE — Telephone Encounter (Signed)
Please review

## 2017-07-12 NOTE — Telephone Encounter (Signed)
Referral placed  Jandy Brackens, Dionne Bucy, MD, MPH [facility] 07/12/2017 1:41 PM

## 2017-07-12 NOTE — Telephone Encounter (Signed)
Pt was seen yesterday and talked to you about a referral to Dr. Rayvon Char at [facility] for liver dx.  She wants to go ahead with the referral.   Pt's call back Is [phone]  thanks teri

## 2017-07-31 DIAGNOSIS — L739 Follicular disorder, unspecified: Secondary | ICD-10-CM | POA: Diagnosis not present

## 2017-07-31 DIAGNOSIS — L853 Xerosis cutis: Secondary | ICD-10-CM | POA: Diagnosis not present

## 2017-07-31 DIAGNOSIS — B351 Tinea unguium: Secondary | ICD-10-CM | POA: Diagnosis not present

## 2017-07-31 DIAGNOSIS — L570 Actinic keratosis: Secondary | ICD-10-CM | POA: Diagnosis not present

## 2017-07-31 DIAGNOSIS — I8310 Varicose veins of unspecified lower extremity with inflammation: Secondary | ICD-10-CM | POA: Diagnosis not present

## 2017-07-31 DIAGNOSIS — D229 Melanocytic nevi, unspecified: Secondary | ICD-10-CM | POA: Diagnosis not present

## 2017-07-31 DIAGNOSIS — L821 Other seborrheic keratosis: Secondary | ICD-10-CM | POA: Diagnosis not present

## 2017-07-31 DIAGNOSIS — L814 Other melanin hyperpigmentation: Secondary | ICD-10-CM | POA: Diagnosis not present

## 2017-07-31 DIAGNOSIS — B353 Tinea pedis: Secondary | ICD-10-CM | POA: Diagnosis not present

## 2017-07-31 DIAGNOSIS — R21 Rash and other nonspecific skin eruption: Secondary | ICD-10-CM | POA: Diagnosis not present

## 2017-08-02 ENCOUNTER — Ambulatory Visit (INDEPENDENT_AMBULATORY_CARE_PROVIDER_SITE_OTHER): Payer: Medicare HMO | Admitting: Family Medicine

## 2017-08-02 ENCOUNTER — Ambulatory Visit (INDEPENDENT_AMBULATORY_CARE_PROVIDER_SITE_OTHER): Payer: Medicare HMO

## 2017-08-02 ENCOUNTER — Encounter: Payer: Self-pay | Admitting: Family Medicine

## 2017-08-02 VITALS — BP 128/56 | HR 70 | Temp 97.6°F | Resp 16 | Ht 62.0 in | Wt 154.0 lb

## 2017-08-02 VITALS — BP 128/56 | HR 70 | Temp 97.6°F | Ht 62.0 in | Wt 154.2 lb

## 2017-08-02 DIAGNOSIS — Z780 Asymptomatic menopausal state: Secondary | ICD-10-CM

## 2017-08-02 DIAGNOSIS — Z0000 Encounter for general adult medical examination without abnormal findings: Secondary | ICD-10-CM

## 2017-08-02 DIAGNOSIS — M8580 Other specified disorders of bone density and structure, unspecified site: Secondary | ICD-10-CM

## 2017-08-02 NOTE — Progress Notes (Signed)
Patient: Shannon Sawyer, Female    DOB: [birth year], 72 y.o.   MRN: [id number] Visit Date: 08/02/2017  Today's Provider: Lavon Paganini, MD   I, Martha Clan, CMA, am acting as scribe for Lavon Paganini, MD.  Chief Complaint  Patient presents with  . Annual Exam   Subjective:     Complete Physical Shannon Sawyer is a 72 y.o. female. She feels well. She reports she is not exercising much. She reports she is sleeping fairly well.  Patient states that she had hepatitis C screening when she was being worked up for her cirrhosis.  She believes that [facility] would have this and her HIV screening test. Her last colonoscopy was performed by [facility].  This is not available in care everywhere.  She believes it would have been done in 2014 or 2015.  She believes she was told to come back in 10 years. -----------------------------------------------------------   Review of Systems  Constitutional: Positive for chills and fatigue. Negative for activity change, appetite change, diaphoresis, fever and unexpected weight change.  HENT: Positive for hearing loss. Negative for congestion, dental problem, drooling, ear discharge, ear pain, facial swelling, mouth sores, nosebleeds, postnasal drip, rhinorrhea, sinus pressure, sinus pain, sneezing, sore throat, tinnitus, trouble swallowing and voice change.   Eyes: Positive for photophobia and itching. Negative for pain, discharge, redness and visual disturbance.  Respiratory: Positive for cough. Negative for apnea, choking, chest tightness, shortness of breath, wheezing and stridor.   Cardiovascular: Positive for leg swelling. Negative for chest pain and palpitations.  Gastrointestinal: Positive for abdominal distention. Negative for abdominal pain, anal bleeding, blood in stool, constipation, diarrhea, nausea, rectal pain and vomiting.  Endocrine: Positive for polyuria. Negative for cold intolerance, heat intolerance, polydipsia and  polyphagia.  Genitourinary: Negative for decreased urine volume, difficulty urinating, dyspareunia, dysuria, enuresis, flank pain, frequency, genital sores, hematuria, menstrual problem, pelvic pain, urgency, vaginal bleeding, vaginal discharge and vaginal pain.  Musculoskeletal: Positive for myalgias. Negative for arthralgias, back pain, gait problem, joint swelling, neck pain and neck stiffness.  Skin: Negative.   Allergic/Immunologic: Positive for environmental allergies. Negative for food allergies and immunocompromised state.  Neurological: Positive for light-headedness. Negative for dizziness, tremors, seizures, syncope, facial asymmetry, speech difficulty, weakness, numbness and headaches.  Hematological: Negative.   Psychiatric/Behavioral: Negative.     Social History   Socioeconomic History  . Marital status: Married    Spouse name: Sonia Side  . Number of children: 2  . Years of education: Not on file  . Highest education level: Some college, no degree  Occupational History  . 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 Financial resource strain: Not hard at all  . Food insecurity:    Worry: Never true    Inability: Never true  . Transportation needs:    Medical: No    Non-medical: No  Tobacco Use  . Smoking status: Former Smoker    Types: Cigarettes  . Smokeless tobacco: Never Used  . Tobacco comment: formerly tried smoking cigarettes, no regular use  Substance and Sexual Activity  . Alcohol use: No  . Drug use: No  . Sexual activity: Yes    Partners: Male    Birth control/protection: Post-menopausal, Surgical  Lifestyle  . Physical activity:    Days per week: Not on file    Minutes per session: Not on file  . Stress: Not at all  Relationships  . Social connections:    Talks on phone:  Not on file    Gets together: Not on file    Attends religious service: Not on file    Active member of club or organization: Not on file    Attends meetings of  clubs or organizations: Not on file    Relationship status: Not on file  . Intimate partner violence:    Fear of current or ex partner: Not on file    Emotionally abused: Not on file    Physically abused: Not on file    Forced sexual activity: Not on file  Other Topics Concern  . Not on file  Social History Narrative   One child was stillborn, and one son died of liver cancer. Pt has one living biological child, and 2 stepchildren.    Past Medical History:  Diagnosis Date  . Allergy   . Anemia    LOW PLATELET COUNTS  ([facility])  . Anxiety   . Carotid artery disease (HCC)    BILAT.        Marland Kitchen Chronic cough   . Cirrhosis, nonalcoholic (HCC)    PORTAL HTN, FATTY TISSUE   . Coronary artery disease   . DDD (degenerative disc disease), cervical    SPONDYLOLISTHESIS  . Depression   . GERD (gastroesophageal reflux disease)   . Headache    HX OCCULAR MIGRAINES  . Hypertension    ESSENTIAL  . Myocardial infarction ([place])   . Shortness of breath dyspnea    WITH EXERTION   . Tricuspid insufficiency    MODERATE      Patient Active Problem List   Diagnosis Date Noted  . H/O acute myocardial infarction 07/11/2017  . GERD (gastroesophageal reflux disease) 07/11/2017  . Cervical radiculopathy 06/03/2014  . TI (tricuspid incompetence) 02/12/2014  . Carotid artery narrowing 01/12/2014  . Cirrhosis of liver not due to alcohol ([place]) 01/12/2014  . Anemia, iron deficiency 01/12/2014  . Disease of liver 01/12/2014  . Breathlessness on exertion 01/12/2014  . Cervical radiculitis 11/04/2013  . DDD (degenerative disc disease), cervical 11/04/2013  . SPL (spondylolisthesis) 11/04/2013  . Arteriosclerosis of coronary artery 05/20/2013  . Essential (primary) hypertension 05/20/2013  . Combined fat and carbohydrate induced hyperlipemia 05/20/2013    Past Surgical History:  Procedure Laterality Date  . ABDOMINAL HYSTERECTOMY    . ANTERIOR CERVICAL DECOMP/DISCECTOMY FUSION  N/A 06/03/2014   Procedure: CERVICAL FIVE-CERVICAL SIX, CERVICAL SIX-CERVICAL SEVEN ANTERIOR CERVICAL DECOMPRESSION/DISCECTOMY ;  Surgeon: Karie Chimera, MD;  Location: [place] [facility];  Service: Neurosurgery;  Laterality: N/A;  . BREAST BIOPSY Left 09/02/2015   benign  . BREAST CYST ASPIRATION Left    neg  . CARDIAC CATHETERIZATION     2 STENTS 2013 IN [place]  . cardiac stents    . ESOPHAGOGASTRODUODENOSCOPY (EGD) WITH PROPOFOL N/A 02/21/2015   Procedure: ESOPHAGOGASTRODUODENOSCOPY (EGD) WITH PROPOFOL;  Surgeon: Josefine Class, MD;  Location: [facility] END[facility];  Service: Endoscopy;  Laterality: N/A;  . EYE SURGERY      Her family history includes COPD in her brother and father; Liver cancer in her mother and son; Lymphoma in her father. There is no history of Breast cancer or Colon cancer.      Current Outpatient Medications:  .  acetaminophen (TYLENOL) 500 MG tablet, Take 500 mg by mouth every 6 (six) hours as needed. , Disp: , Rfl:  .  aspirin EC 81 MG tablet, Take 81 mg by mouth daily. , Disp: , Rfl:  .  lactulose (CHRONULAC) 10 GM/15ML solution,  Take 20 g by mouth 2 (two) times daily., Disp: , Rfl:  .  loratadine (CLARITIN) 10 MG tablet, Take 10 mg by mouth daily as needed for allergies., Disp: , Rfl:  .  losartan (COZAAR) 25 MG tablet, Take 25 mg by mouth daily., Disp: , Rfl:  .  lovastatin (MEVACOR) 40 MG tablet, Take 40 mg by mouth every evening. TAKE ONE TABLET BY MOUTH ONCE DAILY WITH DINNER, Disp: , Rfl:  .  nadolol (CORGARD) 20 MG tablet, Take 10 mg by mouth daily. , Disp: , Rfl:  .  nitroGLYCERIN (NITROSTAT) 0.4 MG SL tablet, Place 0.4 mg under the tongue every 5 (five) minutes as needed for chest pain. , Disp: , Rfl:  .  omeprazole (PRILOSEC) 20 MG capsule, Take 20 mg by mouth daily. , Disp: , Rfl:  .  spironolactone (ALDACTONE) 25 MG tablet, Take 25 mg by mouth 2 (two) times daily. , Disp: , Rfl:  .  zinc sulfate 220 (50 Zn) MG capsule, Take 220 mg by mouth daily. , Disp:  , Rfl:   Patient Care Team: Gayland Curry, MD as PCP - General (Family Medicine) Brita Romp, Dionne Bucy, MD as Consulting Physician (Family Medicine) Idelle Leech, [place] as Consulting Physician (Optometry) Teena Irani, MD as Referring Physician (Internal Medicine) Corey Skains, MD as Consulting Physician (Cardiology)     Objective:   Vitals: There were no vitals taken for this visit.  Physical Exam  Constitutional: She is oriented to person, place, and time. She appears well-developed and well-nourished. No distress.  HENT:  Head: Normocephalic and atraumatic.  Right Ear: External ear normal.  Left Ear: External ear normal.  Nose: Nose normal.  Mouth/Throat: Oropharynx is clear and moist.  Eyes: Pupils are equal, round, and reactive to light. Conjunctivae and EOM are normal. No scleral icterus.  Neck: Neck supple. No thyromegaly present.  Cardiovascular: Normal rate, regular rhythm, normal heart sounds and intact distal pulses.  No murmur heard. Pulmonary/Chest: Effort normal and breath sounds normal. No respiratory distress. She has no wheezes. She has no rales.  Abdominal: Soft. Bowel sounds are normal. She exhibits no distension. There is no tenderness. There is no rebound and no guarding.  Musculoskeletal: She exhibits edema (trace). She exhibits no deformity.  Lymphadenopathy:    She has no cervical adenopathy.  Neurological: She is alert and oriented to person, place, and time.  Skin: Skin is warm and dry. Capillary refill takes less than 2 seconds. No rash noted.  Psychiatric: She has a normal mood and affect. Her behavior is normal.  Vitals reviewed.   Activities of Daily Living In your present state of health, do you have any difficulty performing the following activities: 08/02/2017 07/11/2017  Hearing? Shannon Sawyer  Comment Wears bilateral hearing aids.  -  Vision? Shannon Sawyer  Comment Currently being tested for glaucoma.  -  Difficulty concentrating or making decisions? Shannon Sawyer    Walking or climbing stairs? Y Y  Comment Gets light headed with climbing steps - unsure of cause. Currently seeing a specialty doctor about this.  -  Dressing or bathing? N N  Doing errands, shopping? Y Y  Comment Does not drive currently - pt does not want to. License has not been taken. -  Preparing Food and eating ? N -  Using the Toilet? N -  In the past six months, have you accidently leaked urine? N -  Do you have problems with loss of bowel control? N -  Managing  your Medications? N -  Managing your Finances? N -  Housekeeping or managing your Housekeeping? N -  Some recent data might be hidden    Fall Risk Assessment Fall Risk  08/02/2017 07/11/2017  Falls in the past year? No No     Depression Screen PHQ 2/9 Scores 08/02/2017 07/11/2017  PHQ - 2 Score 0 1  PHQ- 9 Score 3 8    Cognitive Testing - 6-CIT  Correct? Score   Pt declined 6CIT screening at [facility] today.   Assessment & Plan:    Annual Physical Reviewed patient's Family Medical History Reviewed and updated list of patient's medical providers Assessment of cognitive impairment was done Assessed patient's functional ability Established a written schedule for health screening [place] Completed and Reviewed  Exercise Activities and Dietary recommendations Goals    . DIET - REDUCE SODIUM INTAKE     Recommend to continue to cut back on sodium intake to less than 8 grams a day.        Immunization History  Administered Date(s) Administered  . Influenza Split 12/16/2014  . Influenza, High Dose Seasonal PF 11/29/2015, 12/06/2016  . Influenza-Unspecified 01/20/2013  . Pneumococcal Conjugate-13 04/04/2015  . Pneumococcal Polysaccharide-23 06/10/2013  . Tdap 05/11/2014    Health Maintenance  Topic Date Due  . Hepatitis C Screening  30-Aug-1945  . COLONOSCOPY  05/05/1995  . DEXA SCAN  05/05/2010  . INFLUENZA VACCINE  10/03/2017  . MAMMOGRAM  11/07/2018  . TETANUS/TDAP  05/10/2024  .  PNA vac Low Risk Adult  Completed     Discussed health benefits of physical activity, and encouraged her to engage in regular exercise appropriate for her age and condition.    ------------------------------------------------------------------------------------------------------------ Problem List Items Addressed This Visit    None    Visit Diagnoses    Encounter for annual physical exam    -  Primary   Osteopenia, unspecified location       Relevant Orders   DG Bone Density   Postmenopausal       Relevant Orders   DG Bone Density       Return in about 6 months (around 02/01/2018) for chronic disease f/u.   The entirety of the information documented in the History of Present Illness, Review of Systems and Physical Exam were personally obtained by me. Portions of this information were initially documented by Raquel Sarna Ratchford, CMA and reviewed by me for thoroughness and accuracy.    Virginia Crews, MD, MPH [facility] 08/02/2017 4:56 PM

## 2017-08-02 NOTE — Patient Instructions (Signed)
Shannon Sawyer , Thank you for taking time to come for your Medicare Wellness Visit. I appreciate your ongoing commitment to your health goals. Please review the following plan we discussed and let me know if I can assist you in the future.   Screening recommendations/referrals: Colonoscopy: Unsure of previous date completed. Will send record release form to retrieve records and up date file.  Mammogram: Up to date Bone Density: Unsure of previous date completed. Will send record release form to retrieve records and up date file.  Recommended yearly ophthalmology/optometry visit for glaucoma screening and checkup Recommended yearly dental visit for hygiene and checkup  Vaccinations: Influenza vaccine: Up to date Pneumococcal vaccine: Up to date Tdap vaccine: Up to date Shingles vaccine: Pt declines today.     Advanced directives: Advance directive discussed with you today. Even though you declined this today please call our office should you change your mind and we can give you the proper paperwork for you to fill out.  Conditions/risks identified:   Next appointment: 2:40 PM today with Dr Brita Romp.   Preventive Care 8 Years and Older, Female Preventive care refers to lifestyle choices and visits with your health care provider that can promote health and wellness. What does preventive care include?  A yearly physical exam. This is also called an annual well check.  Dental exams once or twice a year.  Routine eye exams. Ask your health care provider how often you should have your eyes checked.  Personal lifestyle choices, including:  Daily care of your teeth and gums.  Regular physical activity.  Eating a healthy diet.  Avoiding tobacco and drug use.  Limiting alcohol use.  Practicing safe sex.  Taking low-dose aspirin every day.  Taking vitamin and mineral supplements as recommended by your health care provider. What happens during an annual well check? The services  and screenings done by your health care provider during your annual well check will depend on your age, overall health, lifestyle risk factors, and family history of disease. Counseling  Your health care provider may ask you questions about your:  Alcohol use.  Tobacco use.  Drug use.  Emotional well-being.  Home and relationship well-being.  Sexual activity.  Eating habits.  History of falls.  Memory and ability to understand (cognition).  Work and work Statistician.  Reproductive health. Screening  You may have the following tests or measurements:  Height, weight, and BMI.  Blood pressure.  Lipid and cholesterol levels. These may be checked every 5 years, or more frequently if you are over 58 years old.  Skin check.  Lung cancer screening. You may have this screening every year starting at age 68 if you have a 30-pack-year history of smoking and currently smoke or have quit within the past 15 years.  Fecal occult blood test (FOBT) of the stool. You may have this test every year starting at age 52.  Flexible sigmoidoscopy or colonoscopy. You may have a sigmoidoscopy every 5 years or a colonoscopy every 10 years starting at age 35.  Hepatitis C blood test.  Hepatitis B blood test.  Sexually transmitted disease (STD) testing.  Diabetes screening. This is done by checking your blood sugar (glucose) after you have not eaten for a while (fasting). You may have this done every 1-3 years.  Bone density scan. This is done to screen for osteoporosis. You may have this done starting at age 62.  Mammogram. This may be done every 1-2 years. Talk to your health care provider  about how often you should have regular mammograms. Talk with your health care provider about your test results, treatment options, and if necessary, the need for more tests. Vaccines  Your health care provider may recommend certain vaccines, such as:  Influenza vaccine. This is recommended every  year.  Tetanus, diphtheria, and acellular pertussis (Tdap, Td) vaccine. You may need a Td booster every 10 years.  Zoster vaccine. You may need this after age 8.  Pneumococcal 13-valent conjugate (PCV13) vaccine. One dose is recommended after age 63.  Pneumococcal polysaccharide (PPSV23) vaccine. One dose is recommended after age 31. Talk to your health care provider about which screenings and vaccines you need and how often you need them. This information is not intended to replace advice given to you by your health care provider. Make sure you discuss any questions you have with your health care provider. Document Released: 03/18/2015 Document Revised: 11/09/2015 Document Reviewed: 12/21/2014 Elsevier Interactive Patient Education  2017 [employer] Prevention in the Home Falls can cause injuries. They can happen to people of all ages. There are many things you can do to make your home safe and to help prevent falls. What can I do on the outside of my home?  Regularly fix the edges of walkways and driveways and fix any cracks.  Remove anything that might make you trip as you walk through a door, such as a raised step or threshold.  Trim any bushes or trees on the path to your home.  Use bright outdoor lighting.  Clear any walking paths of anything that might make someone trip, such as rocks or tools.  Regularly check to see if handrails are loose or broken. Make sure that both sides of any steps have handrails.  Any raised decks and porches should have guardrails on the edges.  Have any leaves, snow, or ice cleared regularly.  Use sand or salt on walking paths during winter.  Clean up any spills in your garage right away. This includes oil or grease spills. What can I do in the bathroom?  Use night lights.  Install grab bars by the toilet and in the tub and shower. Do not use towel bars as grab bars.  Use non-skid mats or decals in the tub or shower.  If you  need to sit down in the shower, use a plastic, non-slip stool.  Keep the floor dry. Clean up any water that spills on the floor as soon as it happens.  Remove soap buildup in the tub or shower regularly.  Attach bath mats securely with double-sided non-slip rug tape.  Do not have throw rugs and other things on the floor that can make you trip. What can I do in the bedroom?  Use night lights.  Make sure that you have a light by your bed that is easy to reach.  Do not use any sheets or blankets that are too big for your bed. They should not hang down onto the floor.  Have a firm chair that has side arms. You can use this for support while you get dressed.  Do not have throw rugs and other things on the floor that can make you trip. What can I do in the kitchen?  Clean up any spills right away.  Avoid walking on wet floors.  Keep items that you use a lot in easy-to-reach places.  If you need to reach something above you, use a strong step stool that has a grab bar.  Keep electrical cords out of the way.  Do not use floor polish or wax that makes floors slippery. If you must use wax, use non-skid floor wax.  Do not have throw rugs and other things on the floor that can make you trip. What can I do with my stairs?  Do not leave any items on the stairs.  Make sure that there are handrails on both sides of the stairs and use them. Fix handrails that are broken or loose. Make sure that handrails are as long as the stairways.  Check any carpeting to make sure that it is firmly attached to the stairs. Fix any carpet that is loose or worn.  Avoid having throw rugs at the top or bottom of the stairs. If you do have throw rugs, attach them to the floor with carpet tape.  Make sure that you have a light switch at the top of the stairs and the bottom of the stairs. If you do not have them, ask someone to add them for you. What else can I do to help prevent falls?  Wear shoes  that:  Do not have high heels.  Have rubber bottoms.  Are comfortable and fit you well.  Are closed at the toe. Do not wear sandals.  If you use a stepladder:  Make sure that it is fully opened. Do not climb a closed stepladder.  Make sure that both sides of the stepladder are locked into place.  Ask someone to hold it for you, if possible.  Clearly mark and make sure that you can see:  Any grab bars or handrails.  First and last steps.  Where the edge of each step is.  Use tools that help you move around (mobility aids) if they are needed. These include:  Canes.  Walkers.  Scooters.  Crutches.  Turn on the lights when you go into a dark area. Replace any light bulbs as soon as they burn out.  Set up your furniture so you have a clear path. Avoid moving your furniture around.  If any of your floors are uneven, fix them.  If there are any pets around you, be aware of where they are.  Review your medicines with your doctor. Some medicines can make you feel dizzy. This can increase your chance of falling. Ask your doctor what other things that you can do to help prevent falls. This information is not intended to replace advice given to you by your health care provider. Make sure you discuss any questions you have with your health care provider. Document Released: 12/16/2008 Document Revised: 07/28/2015 Document Reviewed: 03/26/2014 Elsevier Interactive Patient Education  2017 [employer].

## 2017-08-02 NOTE — Progress Notes (Signed)
Subjective:   Shannon Sawyer is a 72 y.o. female who presents for Medicare Annual (Subsequent) preventive examination.  Review of Systems:  N/A  Cardiac Risk Factors include: advanced age (>14men, >1 women);dyslipidemia;hypertension     Objective:     Vitals: BP (!) 128/56 (BP Location: Right Arm)   Pulse 70   Temp 97.6 F (36.4 C) (Oral)   Ht 5\' 2"  (1.575 m)   Wt 154 lb 3.2 oz (69.9 kg)   BMI 28.20 kg/m   Body mass index is 28.2 kg/m.  Advanced Directives 08/02/2017 11/16/2014 09/13/2014 09/13/2014 05/21/2014  Does Patient Have a Medical Advance Directive? No No No No No  Would patient like information on creating a medical advance directive? No - Patient declined No - patient declined information;Yes - Scientist, clinical (histocompatibility and immunogenetics) given No - patient declined information - Yes Higher education careers adviser given    Tobacco Social History   Tobacco Use  Smoking Status Former Smoker  . Types: Cigarettes  Smokeless Tobacco Never Used  Tobacco Comment   formerly tried smoking cigarettes, no regular use     Counseling given: Not Answered Comment: formerly tried smoking cigarettes, no regular use   Clinical Intake:  Pre-visit preparation completed: Yes  Pain : No/denies pain Pain Score: 0-No pain     Nutritional Status: BMI 25 -29 Overweight Nutritional Risks: None Diabetes: No  How often do you need to have someone help you when you read instructions, pamphlets, or other written materials from your doctor or pharmacy?: 1 - Never  Interpreter Needed?: No  Information entered by :: [facility], LPN  Past Medical History:  Diagnosis Date  . Allergy   . Anemia    LOW PLATELET COUNTS  (DR PANDIT [facility])  . Anxiety   . Carotid artery disease (HCC)    BILAT.        Marland Kitchen Chronic cough   . Cirrhosis, nonalcoholic (HCC)    PORTAL HTN, FATTY TISSUE   . Coronary artery disease   . DDD (degenerative disc disease), cervical    SPONDYLOLISTHESIS  . Depression   . GERD  (gastroesophageal reflux disease)   . Headache    HX OCCULAR MIGRAINES  . Hypertension    ESSENTIAL  . Myocardial infarction ([place])   . Shortness of breath dyspnea    WITH EXERTION   . Tricuspid insufficiency    MODERATE    Past Surgical History:  Procedure Laterality Date  . ABDOMINAL HYSTERECTOMY    . ANTERIOR CERVICAL DECOMP/DISCECTOMY FUSION N/A 06/03/2014   Procedure: CERVICAL FIVE-CERVICAL SIX, CERVICAL SIX-CERVICAL SEVEN ANTERIOR CERVICAL DECOMPRESSION/DISCECTOMY ;  Surgeon: Karie Chimera, MD;  Location: [place] NEURO ORS;  Service: Neurosurgery;  Laterality: N/A;  . BREAST BIOPSY Left 09/02/2015   benign  . BREAST CYST ASPIRATION Left    neg  . CARDIAC CATHETERIZATION     2 STENTS 2013 IN [place]  . cardiac stents    . ESOPHAGOGASTRODUODENOSCOPY (EGD) WITH PROPOFOL N/A 02/21/2015   Procedure: ESOPHAGOGASTRODUODENOSCOPY (EGD) WITH PROPOFOL;  Surgeon: Josefine Class, MD;  Location: [facility] ENDOSCOPY;  Service: Endoscopy;  Laterality: N/A;  . EYE SURGERY     Family History  Problem Relation Age of Onset  . Liver cancer Mother   . Lymphoma Father   . COPD Father   . COPD Brother   . Liver cancer Son   . Breast cancer Neg Hx   . Colon cancer Neg Hx    Social History   Socioeconomic History  . Marital status:  Married    Spouse name: Sonia Side  . Number of children: 2  . Years of education: Not on file  . Highest education level: Some college, no degree  Occupational History  . 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 Financial resource strain: Not hard at all  . Food insecurity:    Worry: Never true    Inability: Never true  . Transportation needs:    Medical: No    Non-medical: No  Tobacco Use  . Smoking status: Former Smoker    Types: Cigarettes  . Smokeless tobacco: Never Used  . Tobacco comment: formerly tried smoking cigarettes, no regular use  Substance and Sexual Activity  . Alcohol use: No  . Drug use: No  . Sexual activity: Yes     Partners: Male    Birth control/protection: Post-menopausal, Surgical  Lifestyle  . Physical activity:    Days per week: Not on file    Minutes per session: Not on file  . Stress: Not at all  Relationships  . Social connections:    Talks on phone: Not on file    Gets together: Not on file    Attends religious service: Not on file    Active member of club or organization: Not on file    Attends meetings of clubs or organizations: Not on file    Relationship status: Not on file  Other Topics Concern  . Not on file  Social History Narrative   One child was stillborn, and one son died of liver cancer. Pt has one living biological child, and 2 stepchildren.    Outpatient Encounter Medications as of 08/02/2017  Medication Sig  . acetaminophen (TYLENOL) 500 MG tablet Take 500 mg by mouth every 6 (six) hours as needed.   Marland Kitchen aspirin EC 81 MG tablet Take 81 mg by mouth daily.   Marland Kitchen loratadine (CLARITIN) 10 MG tablet Take 10 mg by mouth daily as needed for allergies.  Marland Kitchen losartan (COZAAR) 25 MG tablet Take 25 mg by mouth daily.  Marland Kitchen lovastatin (MEVACOR) 40 MG tablet Take 40 mg by mouth every evening. TAKE ONE TABLET BY MOUTH ONCE DAILY WITH DINNER  . nadolol (CORGARD) 20 MG tablet Take 10 mg by mouth daily.   . nitroGLYCERIN (NITROSTAT) 0.4 MG SL tablet Place 0.4 mg under the tongue every 5 (five) minutes as needed for chest pain.   Marland Kitchen omeprazole (PRILOSEC) 20 MG capsule Take 20 mg by mouth daily.   Marland Kitchen spironolactone (ALDACTONE) 25 MG tablet Take 25 mg by mouth 2 (two) times daily.   Marland Kitchen zinc sulfate 220 (50 Zn) MG capsule Take 220 mg by mouth daily.   Marland Kitchen lactulose (CHRONULAC) 10 GM/15ML solution Take 20 g by mouth 2 (two) times daily.   No facility-administered encounter medications on file as of 08/02/2017.     Activities of Daily Living In your present state of health, do you have any difficulty performing the following activities: 08/02/2017 07/11/2017  Hearing? Tempie Donning  Comment Wears bilateral  hearing aids.  -  Vision? Tempie Donning  Comment Currently being tested for glaucoma.  -  Difficulty concentrating or making decisions? Tempie Donning  Walking or climbing stairs? Y Y  Comment Gets light headed with climbing steps - unsure of cause. Currently seeing a specialty doctor about this.  -  Dressing or bathing? N N  Doing errands, shopping? Y Y  Comment Does not drive currently - pt does not want to. License has not  been taken. -  Preparing Food and eating ? N -  Using the Toilet? N -  In the past six months, have you accidently leaked urine? N -  Do you have problems with loss of bowel control? N -  Managing your Medications? N -  Managing your Finances? N -  Housekeeping or managing your Housekeeping? N -  Some recent data might be hidden    Patient Care Team: Gayland Curry, MD as PCP - General (Family Medicine) Brita Romp, Dionne Bucy, MD as Consulting Physician (Family Medicine) Idelle Leech, [place] as Consulting Physician (Optometry) Teena Irani, MD as Referring Physician (Internal Medicine) Corey Skains, MD as Consulting Physician (Cardiology)    Assessment:   This is a routine wellness examination for Shannon Sawyer.  Exercise Activities and Dietary recommendations Current Exercise Habits: The patient does not participate in regular exercise at present, Exercise limited by: orthopedic condition(s)(back pain)  Goals    . DIET - REDUCE SODIUM INTAKE     Recommend to continue to cut back on sodium intake to less than 8 grams a day.        Fall Risk Fall Risk  08/02/2017 07/11/2017  Falls in the past year? No No   Is the patient's home free of loose throw rugs in walkways, pet beds, electrical cords, etc?   yes      Grab bars in the bathroom? no      Handrails on the stairs?   yes      Adequate lighting?   yes  Timed Get Up and Go performed: N/A  Depression Screen PHQ 2/9 Scores 08/02/2017 07/11/2017  PHQ - 2 Score 0 1  PHQ- 9 Score 3 8     Cognitive Function: Pt declined  screening today.         Immunization History  Administered Date(s) Administered  . Influenza Split 12/16/2014  . Influenza, High Dose Seasonal PF 11/29/2015, 12/06/2016  . Influenza-Unspecified 01/20/2013  . Pneumococcal Conjugate-13 04/04/2015  . Pneumococcal Polysaccharide-23 06/10/2013  . Tdap 05/11/2014    Qualifies for Shingles Vaccine? Due for Shingles vaccine. Declined my offer to administer today. Education has been provided regarding the importance of this vaccine. Pt has been advised to call her insurance company to determine her out of pocket expense. Advised she may also receive this vaccine at her local pharmacy or Health Dept. Verbalized acceptance and understanding.  Screening Tests Health Maintenance  Topic Date Due  . Hepatitis C Screening  September 16, 1945  . COLONOSCOPY  05/05/1995  . DEXA SCAN  05/05/2010  . INFLUENZA VACCINE  10/03/2017  . MAMMOGRAM  11/07/2018  . TETANUS/TDAP  05/10/2024  . PNA vac Low Risk Adult  Completed    Cancer Screenings: Lung: Low Dose CT Chest recommended if Age 2-80 years, 30 pack-year currently smoking OR have quit w/in 15years. Patient does not qualify. Breast:  Up to date on Mammogram? Yes   Up to date of Bone Density/Dexa? Unsure, will send a records request to previous PCP. Colorectal: Unsure when last colonoscopy, will send record release to previous PCP.  Additional Screenings:  Hepatitis C Screening: Pt states she has had this screening previously. Will retrieve previous records to update.      Plan:  I have personally reviewed and addressed the Medicare Annual Wellness questionnaire and have noted the following in the patient's chart:  A. Medical and social history B. Use of alcohol, tobacco or illicit drugs  C. Current medications and supplements D. Functional ability and status  E.  Nutritional status F.  Physical activity G. Advance directives H. List of other physicians I.  Hospitalizations, surgeries, and ER  visits in previous 12 months J.  [place] such as hearing and vision if needed, cognitive and depression L. Referrals and appointments - none  In addition, I have reviewed and discussed with patient certain preventive protocols, quality metrics, and best practice recommendations. A written personalized care plan for preventive services as well as general preventive health recommendations were provided to patient.  See attached scanned questionnaire for additional information.   Signed,  Fabio Neighbors, LPN Nurse Health Advisor   Nurse Recommendations: Pt states she has had the Hepaitis C lab, colonoscopy and DEXA scan previously. Records release form completed. Will try to retrieve records and update HM.

## 2017-08-02 NOTE — Patient Instructions (Signed)
Check with your insurance about bone density screening   The CDC recommends two doses of Shingrix (the shingles vaccine) separated by 2 to 6 months for adults age 72 years and older. I recommend checking with your insurance plan regarding coverage for this vaccine.    Preventive Care 31 Years and Older, Female Preventive care refers to lifestyle choices and visits with your health care provider that can promote health and wellness. What does preventive care include?  A yearly physical exam. This is also called an annual well check.  Dental exams once or twice a year.  Routine eye exams. Ask your health care provider how often you should have your eyes checked.  Personal lifestyle choices, including: ? Daily care of your teeth and gums. ? Regular physical activity. ? Eating a healthy diet. ? Avoiding tobacco and drug use. ? Limiting alcohol use. ? Practicing safe sex. ? Taking low-dose aspirin every day. ? Taking vitamin and mineral supplements as recommended by your health care provider. What happens during an annual well check? The services and screenings done by your health care provider during your annual well check will depend on your age, overall health, lifestyle risk factors, and family history of disease. Counseling Your health care provider may ask you questions about your:  Alcohol use.  Tobacco use.  Drug use.  Emotional well-being.  Home and relationship well-being.  Sexual activity.  Eating habits.  History of falls.  Memory and ability to understand (cognition).  Work and work Statistician.  Reproductive health.  Screening You may have the following tests or measurements:  Height, weight, and BMI.  Blood pressure.  Lipid and cholesterol levels. These may be checked every 5 years, or more frequently if you are over 68 years old.  Skin check.  Lung cancer screening. You may have this screening every year starting at age 7 if you have a  30-pack-year history of smoking and currently smoke or have quit within the past 15 years.  Fecal occult blood test (FOBT) of the stool. You may have this test every year starting at age 46.  Flexible sigmoidoscopy or colonoscopy. You may have a sigmoidoscopy every 5 years or a colonoscopy every 10 years starting at age 69.  Hepatitis C blood test.  Hepatitis B blood test.  Sexually transmitted disease (STD) testing.  Diabetes screening. This is done by checking your blood sugar (glucose) after you have not eaten for a while (fasting). You may have this done every 1-3 years.  Bone density scan. This is done to screen for osteoporosis. You may have this done starting at age 36.  Mammogram. This may be done every 1-2 years. Talk to your health care provider about how often you should have regular mammograms.  Talk with your health care provider about your test results, treatment options, and if necessary, the need for more tests. Vaccines Your health care provider may recommend certain vaccines, such as:  Influenza vaccine. This is recommended every year.  Tetanus, diphtheria, and acellular pertussis (Tdap, Td) vaccine. You may need a Td booster every 10 years.  Varicella vaccine. You may need this if you have not been vaccinated.  Zoster vaccine. You may need this after age 47.  Measles, mumps, and rubella (MMR) vaccine. You may need at least one dose of MMR if you were born in 1957 or later. You may also need a second dose.  Pneumococcal 13-valent conjugate (PCV13) vaccine. One dose is recommended after age 31.  Pneumococcal polysaccharide (PPSV23)  vaccine. One dose is recommended after age 36.  Meningococcal vaccine. You may need this if you have certain conditions.  Hepatitis A vaccine. You may need this if you have certain conditions or if you travel or work in places where you may be exposed to hepatitis A.  Hepatitis B vaccine. You may need this if you have certain  conditions or if you travel or work in places where you may be exposed to hepatitis B.  Haemophilus influenzae type b (Hib) vaccine. You may need this if you have certain conditions.  Talk to your health care provider about which screenings and vaccines you need and how often you need them. This information is not intended to replace advice given to you by your health care provider. Make sure you discuss any questions you have with your health care provider. Document Released: 03/18/2015 Document Revised: 11/09/2015 Document Reviewed: 12/21/2014 Elsevier Interactive Patient Education  [employer].

## 2017-08-16 ENCOUNTER — Ambulatory Visit (INDEPENDENT_AMBULATORY_CARE_PROVIDER_SITE_OTHER): Payer: Medicare HMO | Admitting: Family Medicine

## 2017-08-16 ENCOUNTER — Encounter: Payer: Self-pay | Admitting: Family Medicine

## 2017-08-16 VITALS — BP 128/60 | HR 58 | Temp 97.5°F | Resp 16 | Wt 150.0 lb

## 2017-08-16 DIAGNOSIS — R42 Dizziness and giddiness: Secondary | ICD-10-CM

## 2017-08-16 NOTE — Progress Notes (Signed)
  Subjective:     Patient ID: Shannon Sawyer, female   DOB: 09-Aug-1945, 72 y.o.   MRN: [id number] Chief Complaint  Patient presents with  . Dizziness    Pt reports that she is more light headedness. She reports that she was worse yesterday than today. Her husband had to help her walk yesterday because she was scared she was going to fall. Husbnad says he could be her liver toxians because she does not take her lactulose well. Denies chest pain, shortness of breath, headache, slurred speech. she has had some confusion but unsure if it is from other chronic disease.    HPI Has hx of chronic light headed but felt it was worse yesterday when she turned the wrong way and had a sharp back pain which has since improved. Patient is not a good historian and husband supplies much of the history.  Review of Systems     Objective:   Physical Exam  Constitutional: She appears well-developed and well-nourished. No distress (able to get up on exam table with minimum assistance).  Eyes: Pupils are equal, round, and reactive to light. EOM are normal.  Aphakic pupils  Cardiovascular: Regular rhythm.  Bradycardic due to beta blocker  Pulmonary/Chest: Breath sounds normal.  Musculoskeletal: She exhibits no edema (of lower extremities).  Grip strength 5/5.       Assessment:    1. Light headed - Comprehensive metabolic panel - CBC with Differential/Platelet    Plan:    Further f/u pending lab work. Caution to avoid falls. May need medication adjustment per cardiology.

## 2017-08-16 NOTE — Patient Instructions (Signed)
We will call you with the lab results. 

## 2017-08-17 LAB — COMPREHENSIVE METABOLIC PANEL
A/G RATIO: 1.2 (ref 1.2–2.2)
ALBUMIN: 3.6 g/dL (ref 3.5–4.8)
ALK PHOS: 141 IU/L — AB (ref 39–117)
ALT: 19 IU/L (ref 0–32)
AST: 42 IU/L — ABNORMAL HIGH (ref 0–40)
BILIRUBIN TOTAL: 2.4 mg/dL — AB (ref 0.0–1.2)
BUN / CREAT RATIO: 11 — AB (ref 12–28)
BUN: 11 mg/dL (ref 8–27)
CHLORIDE: 104 mmol/L (ref 96–106)
CO2: 20 mmol/L (ref 20–29)
Calcium: 9.4 mg/dL (ref 8.7–10.3)
Creatinine, Ser: 0.96 mg/dL (ref 0.57–1.00)
GFR calc Af Amer: 68 mL/min/{1.73_m2} (ref >59)
GFR calc non Af Amer: 59 mL/min/{1.73_m2} — ABNORMAL LOW (ref >59)
Globulin, Total: 3.1 g/dL (ref 1.5–4.5)
Glucose: 112 mg/dL — ABNORMAL HIGH (ref 65–99)
POTASSIUM: 4.1 mmol/L (ref 3.5–5.2)
SODIUM: 138 mmol/L (ref 134–144)
Total Protein: 6.7 g/dL (ref 6.0–8.5)

## 2017-08-17 LAB — CBC WITH DIFFERENTIAL/PLATELET
BASOS ABS: 0 10*3/uL (ref 0.0–0.2)
Basos: 0 %
EOS (ABSOLUTE): 0.5 10*3/uL — AB (ref 0.0–0.4)
Eos: 7 %
HEMOGLOBIN: 13.2 g/dL (ref 11.1–15.9)
Hematocrit: 38.5 % (ref 34.0–46.6)
Immature Grans (Abs): 0 10*3/uL (ref 0.0–0.1)
Immature Granulocytes: 0 %
LYMPHS ABS: 2.7 10*3/uL (ref 0.7–3.1)
Lymphs: 36 %
MCH: 33.6 pg — AB (ref 26.6–33.0)
MCHC: 34.3 g/dL (ref 31.5–35.7)
MCV: 98 fL — ABNORMAL HIGH (ref 79–97)
MONOS ABS: 0.8 10*3/uL (ref 0.1–0.9)
Monocytes: 11 %
NEUTROS ABS: 3.5 10*3/uL (ref 1.4–7.0)
Neutrophils: 46 %
PLATELETS: 111 10*3/uL — AB (ref 150–450)
RBC: 3.93 x10E6/uL (ref 3.77–5.28)
RDW: 14.6 % (ref 12.3–15.4)
WBC: 7.6 10*3/uL (ref 3.4–10.8)

## 2017-08-19 ENCOUNTER — Telehealth: Payer: Self-pay

## 2017-08-19 NOTE — Telephone Encounter (Signed)
LMTCB

## 2017-08-19 NOTE — Telephone Encounter (Signed)
Pt returned call. Please advise. Thanks TNP

## 2017-08-19 NOTE — Telephone Encounter (Signed)
Patient advised as below. Patient reports that she is feeling better at the moment.

## 2017-08-19 NOTE — Telephone Encounter (Signed)
-----   Message from Carmon Ginsberg, [place] sent at 08/19/2017  7:31 AM EDT ----- No anemia or significant changes to explain light headed. If not improved to f/u with Dr. B this week.

## 2017-09-03 ENCOUNTER — Encounter: Payer: Self-pay | Admitting: Family Medicine

## 2017-09-03 ENCOUNTER — Ambulatory Visit (INDEPENDENT_AMBULATORY_CARE_PROVIDER_SITE_OTHER): Payer: Medicare HMO | Admitting: Family Medicine

## 2017-09-03 VITALS — BP 124/58 | HR 63 | Temp 97.7°F | Wt 152.8 lb

## 2017-09-03 DIAGNOSIS — S00512A Abrasion of oral cavity, initial encounter: Secondary | ICD-10-CM | POA: Diagnosis not present

## 2017-09-03 MED ORDER — CHLORHEXIDINE GLUCONATE 0.12% ORAL RINSE (MEDLINE KIT): OROMUCOSAL | 0 refills | Status: DC

## 2017-09-03 NOTE — Progress Notes (Signed)
Patient: Shannon Sawyer Female    DOB: 05/03/1945   72 y.o.   MRN: [id number] Visit Date: 09/03/2017  Today's Provider: Vernie Murders, PA   Chief Complaint  Patient presents with  . Mouth Sore   Subjective:    Mouth Lesions   The current episode started today. The onset was sudden. The problem has been unchanged. The symptoms are aggravated by eating. Associated symptoms include mouth sores (inner left cheek). Pertinent negatives include no congestion and no sore throat.  Patient states the area is slightly sore. The area is located on inner left cheek and is discolored(black)      Past Medical History:  Diagnosis Date  . Allergy   . Anemia    LOW PLATELET COUNTS  (DR PANDIT [facility])  . Anxiety   . Carotid artery disease (HCC)    BILAT.        Marland Kitchen Chronic cough   . Cirrhosis, nonalcoholic (HCC)    PORTAL HTN, FATTY TISSUE   . Coronary artery disease   . DDD (degenerative disc disease), cervical    SPONDYLOLISTHESIS  . Depression   . GERD (gastroesophageal reflux disease)   . Headache    HX OCCULAR MIGRAINES  . Hypertension    ESSENTIAL  . Myocardial infarction ([place])   . Shortness of breath dyspnea    WITH EXERTION   . Tricuspid insufficiency    MODERATE    Past Surgical History:  Procedure Laterality Date  . ABDOMINAL HYSTERECTOMY    . ANTERIOR CERVICAL DECOMP/DISCECTOMY FUSION N/A 06/03/2014   Procedure: CERVICAL FIVE-CERVICAL SIX, CERVICAL SIX-CERVICAL SEVEN ANTERIOR CERVICAL DECOMPRESSION/DISCECTOMY ;  Surgeon: Karie Chimera, MD;  Location: [place] NEURO ORS;  Service: Neurosurgery;  Laterality: N/A;  . BREAST BIOPSY Left 09/02/2015   benign  . BREAST CYST ASPIRATION Left    neg  . CARDIAC CATHETERIZATION     2 STENTS 2013 IN [place]  . cardiac stents    . ESOPHAGOGASTRODUODENOSCOPY (EGD) WITH PROPOFOL N/A 02/21/2015   Procedure: ESOPHAGOGASTRODUODENOSCOPY (EGD) WITH PROPOFOL;  Surgeon: Josefine Class, MD;  Location: [facility];  Service:  Endoscopy;  Laterality: N/A;  . EYE SURGERY     Family History  Problem Relation Age of Onset  . Liver cancer Mother   . Lymphoma Father   . COPD Father   . COPD Brother   . Liver cancer Son   . Breast cancer Neg Hx   . Colon cancer Neg Hx    Allergies  Allergen Reactions  . Contrast Media  [Iodinated Diagnostic Agents] Rash    Blister rash   . Azelastine Swelling  . Flonase [Fluticasone Propionate] Swelling  . Prednisone Swelling  . Shellfish Allergy Swelling and Other (See Comments)    Per spouse, determined on allergy test.  . Tramadol Nausea Only    Current Outpatient Medications:  .  acetaminophen (TYLENOL) 500 MG tablet, Take 500 mg by mouth every 6 (six) hours as needed. , Disp: , Rfl:  .  aspirin EC 81 MG tablet, Take 81 mg by mouth daily. , Disp: , Rfl:  .  lactulose (CHRONULAC) 10 GM/15ML solution, Take 20 g by mouth 2 (two) times daily., Disp: , Rfl:  .  loratadine (CLARITIN) 10 MG tablet, Take 10 mg by mouth daily as needed for allergies., Disp: , Rfl:  .  losartan (COZAAR) 25 MG tablet, Take 25 mg by mouth daily., Disp: , Rfl:  .  lovastatin (MEVACOR) 40 MG tablet, Take 40 mg  by mouth every evening. TAKE ONE TABLET BY MOUTH ONCE DAILY WITH DINNER, Disp: , Rfl:  .  nadolol (CORGARD) 20 MG tablet, Take 10 mg by mouth daily. , Disp: , Rfl:  .  nitroGLYCERIN (NITROSTAT) 0.4 MG SL tablet, Place 0.4 mg under the tongue every 5 (five) minutes as needed for chest pain. , Disp: , Rfl:  .  omeprazole (PRILOSEC) 20 MG capsule, Take 20 mg by mouth daily. , Disp: , Rfl:  .  spironolactone (ALDACTONE) 25 MG tablet, Take 25 mg by mouth 2 (two) times daily. , Disp: , Rfl:  .  triamcinolone cream (KENALOG) 0.1 %, APPLY ON THE SKIN TWICE A DAY AS NEEDED ITCHING. AVOID FACE, GROIN, UNDERARMS, Disp: , Rfl: 2 .  zinc sulfate 220 (50 Zn) MG capsule, Take 220 mg by mouth daily. , Disp: , Rfl:   Review of Systems  Constitutional: Negative.   HENT: Positive for mouth sores (inner left  cheek). Negative for congestion and sore throat.   Respiratory: Negative.   Cardiovascular: Negative.    Social History   Tobacco Use  . Smoking status: Former Smoker    Types: Cigarettes  . Smokeless tobacco: Never Used  . Tobacco comment: formerly tried smoking cigarettes, no regular use  Substance Use Topics  . Alcohol use: No   Objective:   BP (!) 124/58 (BP Location: Right Arm, Patient Position: Sitting, Cuff Size: Normal)   Pulse 63   Temp 97.7 F (36.5 C) (Oral)   Wt 152 lb 12.8 oz (69.3 kg)   SpO2 99%   BMI 27.95 kg/m   Physical Exam  Constitutional: She is oriented to person, place, and time. She appears well-developed and well-nourished. No distress.  HENT:  Head: Normocephalic and atraumatic.  Right Ear: Hearing and external ear normal.  Left Ear: Hearing and external ear normal.  Nose: Nose normal.  0.5 cm red spot with central white tag in the left cheek mucosa. Slightly tender without bleeding or local lymphadenopathy. No sialolithiasis.  Eyes: Conjunctivae and lids are normal. Right eye exhibits no discharge. Left eye exhibits no discharge. No scleral icterus.  Pulmonary/Chest: Effort normal. No respiratory distress.  Musculoskeletal: Normal range of motion.  Neurological: She is alert and oriented to person, place, and time.  Skin: Skin is intact. No lesion and no rash noted.  Psychiatric: She has a normal mood and affect. Her speech is normal and behavior is normal. Thought content normal.      Assessment & Plan:     1. Abrasion of buccal mucosa, initial encounter First noticed lesion on the left buccal mucosa today. Slightly sore and red with small tear - appears as a bite to the cheek. No local lymphadenopathy or tooth discomfort. Will use Peridex oral rinse BID. Recheck if no better in 5-6 days to be sure an oral surgeon is not needed. - chlorhexidine gluconate, MEDLINE KIT, (PERIDEX) 0.12 % solution; Rinse mouth with 15 cc for 30 seconds twice a day.   Dispense: 473 mL; Refill: 0        Vernie Murders, PA  [facility]

## 2017-09-18 ENCOUNTER — Encounter: Admit: 2017-09-18 | Discharge: 2017-09-19 | Payer: MEDICARE | Attending: Gastroenterology | Primary: Gastroenterology

## 2017-09-18 DIAGNOSIS — K7460 Unspecified cirrhosis of liver: Secondary | ICD-10-CM | POA: Diagnosis not present

## 2017-09-18 MED ORDER — RIFAXIMIN 550 MG TABLET
ORAL_TABLET | Freq: Two times a day (BID) | ORAL | 11 refills | 0 days | Status: CP
Start: 2017-09-18 — End: 2018-03-26

## 2017-09-24 ENCOUNTER — Ambulatory Visit
Admission: RE | Admit: 2017-09-24 | Discharge: 2017-09-24 | Disposition: A | Discharge status: Home / Self Care | Payer: Medicare HMO | Source: Ambulatory Visit | Attending: Family Medicine | Admitting: Family Medicine

## 2017-09-24 ENCOUNTER — Telehealth: Payer: Self-pay

## 2017-09-24 DIAGNOSIS — Z780 Asymptomatic menopausal state: Secondary | ICD-10-CM | POA: Diagnosis not present

## 2017-09-24 DIAGNOSIS — M8580 Other specified disorders of bone density and structure, unspecified site: Secondary | ICD-10-CM | POA: Insufficient documentation

## 2017-09-24 DIAGNOSIS — M85852 Other specified disorders of bone density and structure, left thigh: Secondary | ICD-10-CM | POA: Diagnosis not present

## 2017-09-24 MED ORDER — NADOLOL 20 MG TABLET
ORAL_TABLET | Freq: Every day | ORAL | 11 refills | 0 days | Status: CP
Start: 2017-09-24 — End: 2018-08-13

## 2017-09-24 MED ORDER — LACTULOSE 10 GRAM/15 ML ORAL SOLUTION
Freq: Two times a day (BID) | ORAL | 0 refills | 0.00000 days | Status: CP
Start: 2017-09-24 — End: 2017-11-04

## 2017-09-24 MED ORDER — SPIRONOLACTONE 25 MG TABLET
ORAL_TABLET | Freq: Two times a day (BID) | ORAL | 11 refills | 0 days | Status: CP
Start: 2017-09-24 — End: 2018-09-12

## 2017-09-24 NOTE — Telephone Encounter (Signed)
-----   Message from Virginia Crews, MD sent at 09/24/2017 10:41 AM EDT ----- Bone density scan shows osteopenia (this is some bone loss, but not as bad as osteoporosis).  Recommend regular weight bearing exercise, avoiding smoking, and adequate Ca (1262m/day) and Vit D (1000 units daily) via diet or supplement.  We will recheck in 2 years to ensure this hasn't worsened.  BVirginia Crews MD, MPH [facility] [place]7/23/2019 10:41 AM

## 2017-09-24 NOTE — Telephone Encounter (Signed)
Pt advised.

## 2017-09-25 DIAGNOSIS — R69 Illness, unspecified: Secondary | ICD-10-CM | POA: Diagnosis not present

## 2017-10-01 DIAGNOSIS — H401121 Primary open-angle glaucoma, left eye, mild stage: Secondary | ICD-10-CM | POA: Diagnosis not present

## 2017-10-10 DIAGNOSIS — G8929 Other chronic pain: Secondary | ICD-10-CM | POA: Diagnosis not present

## 2017-10-10 DIAGNOSIS — M419 Scoliosis, unspecified: Secondary | ICD-10-CM | POA: Diagnosis not present

## 2017-10-10 DIAGNOSIS — M47816 Spondylosis without myelopathy or radiculopathy, lumbar region: Secondary | ICD-10-CM | POA: Diagnosis not present

## 2017-10-10 DIAGNOSIS — M5136 Other intervertebral disc degeneration, lumbar region: Secondary | ICD-10-CM | POA: Diagnosis not present

## 2017-10-10 DIAGNOSIS — M25552 Pain in left hip: Secondary | ICD-10-CM | POA: Diagnosis not present

## 2017-10-10 DIAGNOSIS — M545 Low back pain: Secondary | ICD-10-CM | POA: Diagnosis not present

## 2017-10-10 DIAGNOSIS — M7062 Trochanteric bursitis, left hip: Secondary | ICD-10-CM | POA: Diagnosis not present

## 2017-11-05 MED ORDER — LACTULOSE 10 GRAM/15 ML ORAL SOLUTION: 20 g | mL | 0 refills | 0 days | Status: AC

## 2017-11-05 MED ORDER — LACTULOSE 10 GRAM/15 ML ORAL SOLUTION
ORAL | 0 refills | 0.00000 days | Status: CP
Start: 2017-11-05 — End: 2017-12-17

## 2017-11-11 DIAGNOSIS — I2510 Atherosclerotic heart disease of native coronary artery without angina pectoris: Secondary | ICD-10-CM | POA: Diagnosis not present

## 2017-11-11 DIAGNOSIS — E782 Mixed hyperlipidemia: Secondary | ICD-10-CM | POA: Diagnosis not present

## 2017-11-11 DIAGNOSIS — R001 Bradycardia, unspecified: Secondary | ICD-10-CM | POA: Diagnosis not present

## 2017-11-11 DIAGNOSIS — I6523 Occlusion and stenosis of bilateral carotid arteries: Secondary | ICD-10-CM | POA: Diagnosis not present

## 2017-11-11 DIAGNOSIS — I10 Essential (primary) hypertension: Secondary | ICD-10-CM | POA: Diagnosis not present

## 2017-12-05 ENCOUNTER — Other Ambulatory Visit: Payer: Self-pay | Admitting: Sports Medicine

## 2017-12-05 DIAGNOSIS — G8929 Other chronic pain: Secondary | ICD-10-CM

## 2017-12-05 DIAGNOSIS — M5136 Other intervertebral disc degeneration, lumbar region: Secondary | ICD-10-CM | POA: Diagnosis not present

## 2017-12-05 DIAGNOSIS — M5442 Lumbago with sciatica, left side: Secondary | ICD-10-CM | POA: Diagnosis not present

## 2017-12-05 DIAGNOSIS — M419 Scoliosis, unspecified: Secondary | ICD-10-CM | POA: Diagnosis not present

## 2017-12-05 DIAGNOSIS — M25552 Pain in left hip: Secondary | ICD-10-CM | POA: Diagnosis not present

## 2017-12-05 DIAGNOSIS — M47816 Spondylosis without myelopathy or radiculopathy, lumbar region: Secondary | ICD-10-CM | POA: Diagnosis not present

## 2017-12-06 ENCOUNTER — Other Ambulatory Visit: Payer: Self-pay | Admitting: Sports Medicine

## 2017-12-06 DIAGNOSIS — G8929 Other chronic pain: Secondary | ICD-10-CM

## 2017-12-06 DIAGNOSIS — M5442 Lumbago with sciatica, left side: Principal | ICD-10-CM

## 2017-12-10 ENCOUNTER — Ambulatory Visit: Admit: 2017-12-10 | Discharge: 2017-12-11 | Payer: MEDICARE

## 2017-12-10 DIAGNOSIS — K7689 Other specified diseases of liver: Secondary | ICD-10-CM | POA: Diagnosis not present

## 2017-12-10 DIAGNOSIS — R160 Hepatomegaly, not elsewhere classified: Secondary | ICD-10-CM | POA: Diagnosis not present

## 2017-12-10 DIAGNOSIS — K7460 Unspecified cirrhosis of liver: Principal | ICD-10-CM

## 2017-12-18 MED ORDER — LACTULOSE 10 GRAM/15 ML ORAL SOLUTION
ORAL | 0 refills | 0 days | Status: CP
Start: 2017-12-18 — End: 2018-03-27

## 2017-12-25 ENCOUNTER — Ambulatory Visit
Admission: RE | Admit: 2017-12-25 | Discharge: 2017-12-25 | Disposition: A | Discharge status: Home / Self Care | Payer: Medicare HMO | Source: Ambulatory Visit | Attending: Sports Medicine | Admitting: Sports Medicine

## 2017-12-25 DIAGNOSIS — M4186 Other forms of scoliosis, lumbar region: Secondary | ICD-10-CM | POA: Diagnosis not present

## 2017-12-25 DIAGNOSIS — M47896 Other spondylosis, lumbar region: Secondary | ICD-10-CM | POA: Diagnosis not present

## 2017-12-25 DIAGNOSIS — M48061 Spinal stenosis, lumbar region without neurogenic claudication: Secondary | ICD-10-CM | POA: Diagnosis not present

## 2017-12-25 DIAGNOSIS — M5442 Lumbago with sciatica, left side: Secondary | ICD-10-CM | POA: Insufficient documentation

## 2017-12-25 DIAGNOSIS — G8929 Other chronic pain: Secondary | ICD-10-CM | POA: Diagnosis not present

## 2017-12-25 DIAGNOSIS — M4316 Spondylolisthesis, lumbar region: Secondary | ICD-10-CM | POA: Diagnosis not present

## 2017-12-25 DIAGNOSIS — M545 Low back pain: Secondary | ICD-10-CM | POA: Diagnosis not present

## 2018-01-11 ENCOUNTER — Ambulatory Visit (INDEPENDENT_AMBULATORY_CARE_PROVIDER_SITE_OTHER): Payer: Medicare HMO

## 2018-01-11 DIAGNOSIS — Z23 Encounter for immunization: Secondary | ICD-10-CM

## 2018-02-03 ENCOUNTER — Ambulatory Visit (INDEPENDENT_AMBULATORY_CARE_PROVIDER_SITE_OTHER): Payer: Medicare HMO | Admitting: Family Medicine

## 2018-02-03 ENCOUNTER — Encounter: Payer: Self-pay | Admitting: Family Medicine

## 2018-02-03 VITALS — BP 124/76 | HR 64 | Temp 97.5°F | Wt 149.0 lb

## 2018-02-03 DIAGNOSIS — D696 Thrombocytopenia, unspecified: Secondary | ICD-10-CM | POA: Insufficient documentation

## 2018-02-03 DIAGNOSIS — K7460 Unspecified cirrhosis of liver: Secondary | ICD-10-CM

## 2018-02-03 DIAGNOSIS — E782 Mixed hyperlipidemia: Secondary | ICD-10-CM

## 2018-02-03 DIAGNOSIS — I10 Essential (primary) hypertension: Secondary | ICD-10-CM

## 2018-02-03 NOTE — Assessment & Plan Note (Signed)
Stable on last visit Recheck CBC today Related to her cirrhosis Followed by [facility] hepatology

## 2018-02-03 NOTE — Patient Instructions (Signed)

## 2018-02-03 NOTE — Assessment & Plan Note (Signed)
Well-controlled Continue current medications Check metabolic panel Also followed by cardiology

## 2018-02-03 NOTE — Assessment & Plan Note (Signed)
This previously well controlled Tolerating lovastatin well We will plan to recheck lipid panel at next visit as it has been less than a year since this was last checked

## 2018-02-03 NOTE — Progress Notes (Signed)
Patient: Shannon Sawyer Female    DOB: Nov 28, 1945   72 y.o.   MRN: [id number] Visit Date: 02/03/2018  Today's Provider: Lavon Paganini, MD   Chief Complaint  Patient presents with  . Hypertension  . Hyperlipidemia   Subjective:    HPI  Hypertension, follow-up:  BP Readings from Last 3 Encounters:  02/03/18 124/76  09/03/17 (!) 124/58  08/16/17 128/60    She was last seen for hypertension 6 months ago.  BP at that visit was 124/74. Management since that visit includes no change.She reports good compliance with treatment. She is not having side effects.  She is not exercising. She is adherent to low salt diet.   Outside blood pressures are not checking bp at home. She is experiencing none.  Patient denies chest pain, chest pressure/discomfort, claudication, dyspnea, exertional chest pressure/discomfort, fatigue, irregular heart beat, lower extremity edema, near-syncope, orthopnea, palpitations, paroxysmal nocturnal dyspnea, syncope and tachypnea.   Cardiovascular risk factors include advanced age (older than 62 for men, 10 for women), dyslipidemia and hypertension.  Use of agents associated with hypertension: none.   ------------------------------------------------------------------------    Lipid/Cholesterol, Follow-up:   Last seen for this 6 months ago.  Management since that visit includes no change.  Last Lipid Panel:    Component Value Date/Time   CHOL 167 05/08/2017   TRIG 60 05/08/2017   HDL 68 05/08/2017   LDLCALC 86 05/08/2017    She reports good compliance with treatment. She is not having side effects.   Wt Readings from Last 3 Encounters:  02/03/18 149 lb (67.6 kg)  09/03/17 152 lb 12.8 oz (69.3 kg)  08/16/17 150 lb (68 kg)    ------------------------------------------------------------------------ She continues to follow with [facility] for Cirrhosis. Last labs 6 months aog.  Next f/u in February.     Allergies  Allergen Reactions    . Contrast Media  [Iodinated Diagnostic Agents] Rash    Blister rash   . Azelastine Swelling  . Flonase [Fluticasone Propionate] Swelling  . Prednisone Swelling  . Shellfish Allergy Swelling and Other (See Comments)    Per spouse, determined on allergy test.  . Tramadol Nausea Only     Current Outpatient Medications:  .  acetaminophen (TYLENOL) 500 MG tablet, Take 500 mg by mouth every 6 (six) hours as needed. , Disp: , Rfl:  .  aspirin EC 81 MG tablet, Take 81 mg by mouth daily. , Disp: , Rfl:  .  chlorhexidine gluconate, MEDLINE KIT, (PERIDEX) 0.12 % solution, Rinse mouth with 15 cc for 30 seconds twice a day., Disp: 473 mL, Rfl: 0 .  lactulose (CHRONULAC) 10 GM/15ML solution, Take 20 g by mouth 2 (two) times daily., Disp: , Rfl:  .  loratadine (CLARITIN) 10 MG tablet, Take 10 mg by mouth daily as needed for allergies., Disp: , Rfl:  .  losartan (COZAAR) 25 MG tablet, Take 25 mg by mouth daily., Disp: , Rfl:  .  lovastatin (MEVACOR) 40 MG tablet, Take 40 mg by mouth every evening. TAKE ONE TABLET BY MOUTH ONCE DAILY WITH DINNER, Disp: , Rfl:  .  nitroGLYCERIN (NITROSTAT) 0.4 MG SL tablet, Place 0.4 mg under the tongue every 5 (five) minutes as needed for chest pain. , Disp: , Rfl:  .  omeprazole (PRILOSEC) 20 MG capsule, Take 20 mg by mouth daily. , Disp: , Rfl:  .  spironolactone (ALDACTONE) 25 MG tablet, Take 25 mg by mouth 2 (two) times daily. , Disp: ,  Rfl:  .  triamcinolone cream (KENALOG) 0.1 %, APPLY ON THE SKIN TWICE A DAY AS NEEDED ITCHING. AVOID FACE, GROIN, UNDERARMS, Disp: , Rfl: 2 .  zinc sulfate 220 (50 Zn) MG capsule, Take 220 mg by mouth daily. , Disp: , Rfl:  .  nadolol (CORGARD) 20 MG tablet, Take 10 mg by mouth daily. , Disp: , Rfl:   Review of Systems  Constitutional: Negative.   HENT: Negative.   Respiratory: Negative.   Cardiovascular: Negative.   Gastrointestinal: Negative.   Genitourinary: Negative.   Neurological: Negative.   Hematological: Negative.    Psychiatric/Behavioral: Negative.     Social History   Tobacco Use  . Smoking status: Former Smoker    Types: Cigarettes  . Smokeless tobacco: Never Used  . Tobacco comment: formerly tried smoking cigarettes, no regular use  Substance Use Topics  . Alcohol use: No   Objective:   BP 124/76 (BP Location: Right Arm, Patient Position: Sitting, Cuff Size: Normal)   Pulse 64   Temp (!) 97.5 F (36.4 C) (Oral)   Wt 149 lb (67.6 kg)   SpO2 99%   BMI 27.25 kg/m  Vitals:   02/03/18 0944  BP: 124/76  Pulse: 64  Temp: (!) 97.5 F (36.4 C)  TempSrc: Oral  SpO2: 99%  Weight: 149 lb (67.6 kg)     Physical Exam  Constitutional: She is oriented to person, place, and time. She appears well-developed and well-nourished. No distress.  HENT:  Head: Normocephalic and atraumatic.  Right Ear: External ear normal.  Left Ear: External ear normal.  Nose: Nose normal.  Mouth/Throat: Oropharynx is clear and moist.  Eyes: Conjunctivae are normal. No scleral icterus.  Neck: Neck supple. No thyromegaly present.  Cardiovascular: Normal rate, regular rhythm, normal heart sounds and intact distal pulses.  No murmur heard. Pulmonary/Chest: Effort normal and breath sounds normal. No respiratory distress. She has no wheezes. She has no rales.  Abdominal: Soft. Bowel sounds are normal. She exhibits no distension. There is no tenderness. There is no rebound and no guarding.  Musculoskeletal: She exhibits no edema or deformity.  Lymphadenopathy:    She has no cervical adenopathy.  Neurological: She is alert and oriented to person, place, and time.  Skin: Skin is warm and dry. Capillary refill takes less than 2 seconds. No rash noted.  Psychiatric: She has a normal mood and affect. Her behavior is normal.  Vitals reviewed.       Assessment & Plan:   Problem List Items Addressed This Visit      Cardiovascular and Mediastinum   Essential (primary) hypertension - Primary     Well-controlled Continue current medications Check metabolic panel Also followed by cardiology      Relevant Orders   Comprehensive metabolic panel     Digestive   Cirrhosis of liver not due to alcohol [facility])    Now followed by [facility] hepatology Was previously followed by [facility] She is taking her medications as prescribed and states that things are doing well Asymptomatic currently Recheck CMP Also with some thrombocytopenia in the past related to her cirrhosis, so we will recheck CBC today      Relevant Orders   CBC   Comprehensive metabolic panel     Other   Combined fat and carbohydrate induced hyperlipemia    This previously well controlled Tolerating lovastatin well We will plan to recheck lipid panel at next visit as it has been less than a year since this was  last checked      Thrombocytopenia ([place])    Stable on last visit Recheck CBC today Related to her cirrhosis Followed by [facility] hepatology      Relevant Orders   CBC       Return in about 6 months (around 08/05/2018) for CPE/AWV (after 08/03/18).   The entirety of the information documented in the History of Present Illness, Review of Systems and Physical Exam were personally obtained by me. Portions of this information were initially documented by Tiburcio Pea and Hurman Horn, CMA and reviewed by me for thoroughness and accuracy.    Virginia Crews, MD, MPH [facility] 02/03/2018 10:20 AM

## 2018-02-03 NOTE — Assessment & Plan Note (Signed)
Now followed by [facility] hepatology Was previously followed by [facility] She is taking her medications as prescribed and states that things are doing well Asymptomatic currently Recheck CMP Also with some thrombocytopenia in the past related to her cirrhosis, so we will recheck CBC today

## 2018-02-04 LAB — COMPREHENSIVE METABOLIC PANEL
A/G RATIO: 1.1 — AB (ref 1.2–2.2)
ALT: 23 IU/L (ref 0–32)
AST: 45 IU/L — AB (ref 0–40)
Albumin: 3.4 g/dL — ABNORMAL LOW (ref 3.5–4.8)
Alkaline Phosphatase: 163 IU/L — ABNORMAL HIGH (ref 39–117)
BUN/Creatinine Ratio: 9 — ABNORMAL LOW (ref 12–28)
BUN: 10 mg/dL (ref 8–27)
Bilirubin Total: 2.5 mg/dL — ABNORMAL HIGH (ref 0.0–1.2)
CALCIUM: 10.1 mg/dL (ref 8.7–10.3)
CO2: 20 mmol/L (ref 20–29)
Chloride: 102 mmol/L (ref 96–106)
Creatinine, Ser: 1.08 mg/dL — ABNORMAL HIGH (ref 0.57–1.00)
GFR calc Af Amer: 59 mL/min/{1.73_m2} — ABNORMAL LOW (ref >59)
GFR, EST NON AFRICAN AMERICAN: 51 mL/min/{1.73_m2} — AB (ref >59)
GLOBULIN, TOTAL: 3.1 g/dL (ref 1.5–4.5)
Glucose: 75 mg/dL (ref 65–99)
POTASSIUM: 4.6 mmol/L (ref 3.5–5.2)
SODIUM: 138 mmol/L (ref 134–144)
Total Protein: 6.5 g/dL (ref 6.0–8.5)

## 2018-02-04 LAB — CBC
HEMATOCRIT: 35.4 % (ref 34.0–46.6)
Hemoglobin: 12.4 g/dL (ref 11.1–15.9)
MCH: 34.2 pg — ABNORMAL HIGH (ref 26.6–33.0)
MCHC: 35 g/dL (ref 31.5–35.7)
MCV: 98 fL — AB (ref 79–97)
Platelets: 91 10*3/uL — CL (ref 150–450)
RBC: 3.63 x10E6/uL — ABNORMAL LOW (ref 3.77–5.28)
RDW: 12.7 % (ref 12.3–15.4)
WBC: 8.5 10*3/uL (ref 3.4–10.8)

## 2018-03-26 ENCOUNTER — Encounter: Admit: 2018-03-26 | Discharge: 2018-03-27 | Payer: MEDICARE | Attending: Gastroenterology | Primary: Gastroenterology

## 2018-03-26 DIAGNOSIS — K7460 Unspecified cirrhosis of liver: Secondary | ICD-10-CM | POA: Diagnosis not present

## 2018-03-26 MED ORDER — RIFAXIMIN 550 MG TABLET
ORAL_TABLET | Freq: Two times a day (BID) | ORAL | 11 refills | 0.00000 days | Status: CP
Start: 2018-03-26 — End: ?

## 2018-03-26 NOTE — Unmapped (Addendum)
--   labs from 02/03/18 reviewed  -- you need to start taking your lactulose DAILY at least 1/2 dose in addition to miralax daily for at least 2BM daily  -- my pharmacist is working on [employer]   -- For itching, would like to try generic zyrtec (Certirazine)  -- No change to spironolactone for fluid; please work on no added sodium diet  -- ultrasound due in April 2020  -- follow-up in 3 months with [place] same day

## 2018-03-26 NOTE — Unmapped (Signed)
Per test claim for XIFAXAN 550 MG TABLET at the [facility], patient needs Medication Assistance Program for High Copay. [phone]

## 2018-03-27 MED ORDER — LACTULOSE 10 GRAM/15 ML ORAL SOLUTION
ORAL | 11 refills | 0 days | Status: CP
Start: 2018-03-27 — End: ?

## 2018-03-27 NOTE — Unmapped (Signed)
Refill sent on [facility] to [facility]. I called the patient to let her know. We discussed the xifaxan cost, and getting her assistance. I gave her the number to [facility]. I completed the Covermymeds online submission, and the medication received prior approval effective until Dec 2020.

## 2018-03-30 NOTE — Unmapped (Signed)
[facility] Ph [phone] Fax [phone]        PRIMARY CARE PROVIDER:  Danton Clap, MD      PATIENT PROFILE:        Caitlin Short is a 73 y.o. female (DOB: 03-May-1945) who is seen in follow-up for NASH cirrhosis.      HISTORY OF PRESENT ILLNESS: This is a 73 y.o. female with a PMH notable for NASH cirrhosis complicated by diuretic responsive ascites, nonbleeding esophageal varices (on NSBB), and hepatic encephalopathy; CAD s/p PCI x 2, carotid artery stenosis, tricuspid regurgitation, HLD, and HTN who presents for cirrhosis care. She has been seen and [facility] in the past.     She was diagnosed with cirrhosis about 4 years ago when she was found to have iron deficiency anemia and thrombocytopenia. She has not required hospitalization due to her liver disease or its complications since her diagnosis. She has ascites that is controlled with spironolactone 50mg  PO QD. She had a diagnostic paracentesis in Aurelie 2018 when she first developed ascites. SAAG was consistent with portal HTN. She has not required LVP. She does have some peripheral edema that responds to elevation of her legs. Previous EGD showed grade 2 esophageal varices for which she takes nadolol. She has not had bleeding varices. She has low grade encephalopathy for which she takes lactulose. She lives in [place] with her husband. She used to work as a Hotel manager. Her husband is a Programmer, multimedia.    Interim History: She presents today with her husband.  She denies any hospitalizations or ER visits since her last clinic visit in July 2019.  In the interim, she has undergone an abdominal ultrasound in October as well as labs in December.  These were done with her primary care physician.  She continues to take low-dose spironolactone and states that she still adds some salty foods but is better at avoiding high salt foods.  She denies any ascites or lower extremity edema.  She denies any upper or lower GI bleeding.  She does report being sleepy all of the time but not actually confused.  Her husband reports that she does have bouts of confusion and she does not take her lactulose as prescribed.  He is very concerned about this.  She reports that she was unable to get Xifaxan after her last appointment.  She does not like taking lactulose because it makes her bloated and gassy.  She has a tendency towards constipation.  She also reports that she is pruritic all of the time she uses triamcinolone cream. Her itching is worse at night and can actually wake her from sleep.  Her weight is stable.  She reports that her appetite is not particularly good. Patient denies any jaundice, SOB, CP, increased abdominal girth, LE edema, melena, BRBPR, depression, nausea, vomiting, or diarrhea.      CIRRHOSIS CARE:  1. EGD: Last EGD 10/08/16 at [facility]. Showed grade 2 varices; on nadolol  2. Imaging: U/S [facility] 12/10/17 - cirrhotic liver; no masses; no ascites, spleen 9.6 cm  3. Vaccination:   Immunization History   Administered Date(s) Administered   ??? Hepatitis A 04/22/2014, 05/20/2014   ??? Hepatitis B Vaccine, Unspecified Formulation 04/22/2014, 05/20/2014, 10/21/2014   ??? INFLUENZA TIV (TRI) 62MO+ W/ PRESERV (IM) 12/16/2014   ??? Influenza Virus Vaccine, unspecified formulation 01/20/2013, 12/16/2014   ??? Influenza, High Dose (IIV3) 65 yrs & older 11/29/2015, 12/06/2016, 01/11/2018   ??? PNEUMOCOCCAL POLYSACCHARIDE 23 06/10/2013   ??? Pneumococcal Conjugate 13-Valent  04/04/2015   ??? TdaP 05/11/2014     4. Bone Health: not addressed today  5. Transplant Status: not currently being evaluated for transplant due to stable MELD-Na, as well as her age + comorbidities      REVIEW OF SYSTEMS:     The balance of 12 systems reviewed is negative except as noted in the HPI.       PAST MEDICAL HISTORY:    Past Medical History:   Diagnosis Date   ??? Carotid artery stenosis    ??? Cirrhosis (CMS-HCC)    ??? Coronary artery disease     s/p PCI x 2   ??? Fatty liver    ??? Hypertension    ??? Tricuspid regurgitation        PAST SURGICAL HISTORY:    Past Surgical History:   Procedure Laterality Date   ??? BACK SURGERY     ??? COLONOSCOPY     ??? UPPER GASTROINTESTINAL ENDOSCOPY         MEDICATIONS:      Current Outpatient Medications:   ???  acetaminophen (TYLENOL) 500 MG tablet, Take 500 mg by mouth every six (6) hours as needed., Disp: , Rfl:   ???  aspirin (ECOTRIN) 81 MG tablet, Take 81 mg by mouth daily., Disp: , Rfl:   ???  loratadine (CLARITIN) 10 mg tablet, Take 10 mg by mouth daily as needed., Disp: , Rfl:   ???  losartan (COZAAR) 25 MG tablet, Take 25 mg by mouth daily., Disp: , Rfl:   ???  lovastatin (MEVACOR) 40 MG tablet, Take 40 mg by mouth every evening., Disp: , Rfl:   ???  nadolol (CORGARD) 20 MG tablet, Take 0.5 tablets (10 mg total) by mouth daily., Disp: 15 tablet, Rfl: 11  ???  omeprazole (PRILOSEC) 20 MG capsule, Take 20 mg by mouth daily., Disp: , Rfl:   ???  spironolactone (ALDACTONE) 25 MG tablet, Take 1 tablet (25 mg total) by mouth Two (2) times a day., Disp: 60 tablet, Rfl: 11  ???  lactulose (CHRONULAC) 10 gram/15 mL solution, TAKE 30 ML (20 G TOTAL) BY MOUTH TWO (2) TIMES A DAY., Disp: 840 mL, Rfl: 11  ???  rifAXIMin (XIFAXAN) 550 mg Tab, Take 1 tablet (550 mg total) by mouth Two (2) times a day., Disp: 60 tablet, Rfl: 11  (Not in a hospital admission)      ALLERGIES:    Azelastine; Fluticasone; Iodinated contrast media; Prednisone; Shellfish containing products; and Tramadol    SOCIAL HISTORY  Social History     Socioeconomic History   ??? Marital status: Married     Spouse name: None   ??? Number of children: None   ??? Years of education: None   ??? Highest education level: None   Occupational History   ??? None   Social Needs   ??? Financial resource strain: None   ??? Food insecurity:     Worry: None     Inability: None   ??? Transportation needs:     Medical: None     Non-medical: None   Tobacco Use   ??? Smoking status: Former Smoker   ??? Smokeless tobacco: Never Used   Substance and Sexual Activity   ??? Alcohol use: Not Currently   ??? Drug use: Never   ??? Sexual activity: None   Lifestyle   ??? Physical activity:     Days per week: None     Minutes per session: None   ??? Stress: None  Relationships   ??? Social connections:     Talks on phone: None     Gets together: None     Attends religious service: None     Active member of club or organization: None     Attends meetings of clubs or organizations: None     Relationship status: None   Other Topics Concern   ??? None   Social History Narrative   ??? None       FAMILY HISTORY:    family history includes Liver cancer in her mother and son; Pancreatitis in her son.      VITAL SIGNS:    BP 156/68  - Pulse 70  - Temp 35.8 ??C (96.4 ??F) (Oral)  - Wt 70.9 kg (156 lb 3.2 oz)  - SpO2 100%  - BMI 28.57 kg/m??     PHYSICAL EXAM:    Constitutional:   Alert, oriented x 3, no acute distress, mild muscle wasting   Mental Status:   Thought organized, appropriate affect, pleasantly interactive, not anxious appearing.   HEENT:   PERRL, conjunctiva clear, anicteric, oropharynx clear, neck supple, no LAD.   Respiratory: Clear to auscultation, unlabored breathing.     Cardiac: Euvolemic, regular rate and rhythm, normal S1 and S2, no murmur.     Abdomen: Soft, normal bowel sounds, non-distended, non-tender, no organomegaly or masses.     Perianal/Rectal Exam Not performed.     Extremities:   No LE edema.   Musculoskeletal: No joint swelling or tenderness noted, no deformities.     Skin: Spider angiomas and palmar erythema noted     Neuro: No focal deficits. No asterixis       LABS:  02/03/18 WBC 8.5 hgb 12.4 plt 91 Na 138 K 4.6 BUN 10 Cr 1.08 alb 3.4 TB 2.5 AST 45 ALT 23 AP 163  INR 1.3 (06/2017)    MELD Na 14 CPT score 8 Childs B     ASSESSMENT:        This is a 73 y.o. female with decompensated NASH cirrhosis c/b ascites, non-bleeding varices and encephalopathy. She has a low MELD score and is not a good OLT candidate based on age and medical co-morbidities as well as non-competitive MELD score.    Diet: Low Na and high protein (2g Na and 80 g protein) Patient does have some muscle wasting and we encouraged her to consume more protein, particularly a bedtime snack that contains protein.     Hepatic encephalopathy:  Clearly an issue and consistent with mildly increased INR. Discussed that she must take a small dose of lactulose in addition to her daily Miralax for at least 1-2 BM daily; will also submit forms to get Xifaxan- my Pharmacist to call patient    Ascites/Edema: Cont spironolactone 50mg  and focus on 2g Na diet    HCC screening: next ultrasound in 06/2018    Grade 2 esophageal varices: last EGD in Aug 2018; remain on nadolol; rescope if patient decompensated or needs to be taken off beta blocker    Bone care: needs vitamin D checked and bone density if she has not had one    Pruritis: likely related to liver disease; start certirazine daily and consider doxepin at night; skin care discussed       PLAN:        -- labs from 02/03/18 reviewed; MELD 14  -- [place] from 12/2017 reviewed- no HCC or ascites  -- Take lactulose DAILY at least 1/2 dose in addition  to miralax daily for at least 2BM daily  -- my pharmacist is working on [facility]  -- For itching, would like to try generic zyrtec (Certirazine); consider doxepin at bedtime  -- No change to spironolactone for fluid; patient to work on no added sodium diet  -- ultrasound due in April 2020  -- patient should have Shingrix vaccine  -- follow-up in 3 months with [place] same day    Please feel free to call with questions.    Elyna Pangilinan M. Piedad Climes, MD  [facility]

## 2018-05-02 NOTE — Unmapped (Signed)
Reason for call: Follow up on MFR assistance application for Xifaxan    The Medication Assistance Program has reached the maximum number of attempts to obtain necessary information from the Patient in regards to the manufacturer assistance procurement process. Anticipated copay for Xifaxan is $897.84. MfR assistance application needed to see if pt can be approved for assistance. Called pt at phone number [phone] and left a VM with request for pt to return call. Contact information provided.       Vertell Limber RN, BSN  Nursing Care Coordinator   Pharmacy Adult GI Medicine  [facility]  [address]   [place], [place] [address]  [phone]

## 2018-05-07 ENCOUNTER — Encounter: Payer: Self-pay | Admitting: Family Medicine

## 2018-05-07 ENCOUNTER — Ambulatory Visit (INDEPENDENT_AMBULATORY_CARE_PROVIDER_SITE_OTHER): Payer: Medicare Other | Admitting: Family Medicine

## 2018-05-07 VITALS — BP 123/70 | HR 71 | Temp 98.0°F | Wt 152.8 lb

## 2018-05-07 DIAGNOSIS — I10 Essential (primary) hypertension: Secondary | ICD-10-CM

## 2018-05-07 DIAGNOSIS — R7989 Other specified abnormal findings of blood chemistry: Secondary | ICD-10-CM

## 2018-05-07 DIAGNOSIS — J301 Allergic rhinitis due to pollen: Secondary | ICD-10-CM

## 2018-05-07 NOTE — Assessment & Plan Note (Signed)
Well-controlled Continue current medications Recheck renal function panel for elevated creatinine as below Follow-up at CPE in 3 months

## 2018-05-07 NOTE — Progress Notes (Signed)
Patient: Shannon Sawyer Female    DOB: Jul 05, 1945   73 y.o.   MRN: [id number] Visit Date: 05/07/2018  Today's Provider: Lavon Paganini, MD   Chief Complaint  Patient presents with  . Follow-up   Subjective:    Sore Throat   This is a new problem. The current episode started in the past 7 days. The problem has been unchanged. Sore throat worse side: Middle. There has been no fever. The pain is at a severity of 0/10. The patient is experiencing no pain.  She feels like this is related to postnasal drip.  She has seasonal allergies.  She is taking Zyrtec.  States she cannot take Flonase.    Follow-up Patient presents today for abnormal labs follow-up. Patient states that her kidney function was abnormal and she was advised to drink more water.  She denies any NSAID use.  She states she is urinating well without complication.   Allergies  Allergen Reactions  . Contrast Media  [Iodinated Diagnostic Agents] Rash    Blister rash   . Azelastine Swelling  . Flonase [Fluticasone Propionate] Swelling  . Prednisone Swelling  . Shellfish Allergy Swelling and Other (See Comments)    Per spouse, determined on allergy test.  . Tramadol Nausea Only     Current Outpatient Medications:  .  acetaminophen (TYLENOL) 500 MG tablet, Take 500 mg by mouth every 6 (six) hours as needed. , Disp: , Rfl:  .  aspirin EC 81 MG tablet, Take 81 mg by mouth daily. , Disp: , Rfl:  .  chlorhexidine gluconate, MEDLINE KIT, (PERIDEX) 0.12 % solution, Rinse mouth with 15 cc for 30 seconds twice a day., Disp: 473 mL, Rfl: 0 .  lactulose (CHRONULAC) 10 GM/15ML solution, Take 20 g by mouth 2 (two) times daily., Disp: , Rfl:  .  loratadine (CLARITIN) 10 MG tablet, Take 10 mg by mouth daily as needed for allergies., Disp: , Rfl:  .  losartan (COZAAR) 25 MG tablet, Take 25 mg by mouth daily., Disp: , Rfl:  .  lovastatin (MEVACOR) 40 MG tablet, Take 40 mg by mouth every evening. TAKE ONE TABLET BY MOUTH ONCE  DAILY WITH DINNER, Disp: , Rfl:  .  nitroGLYCERIN (NITROSTAT) 0.4 MG SL tablet, Place 0.4 mg under the tongue every 5 (five) minutes as needed for chest pain. , Disp: , Rfl:  .  omeprazole (PRILOSEC) 20 MG capsule, Take 20 mg by mouth daily. , Disp: , Rfl:  .  spironolactone (ALDACTONE) 25 MG tablet, Take 25 mg by mouth 2 (two) times daily. , Disp: , Rfl:  .  triamcinolone cream (KENALOG) 0.1 %, APPLY ON THE SKIN TWICE A DAY AS NEEDED ITCHING. AVOID FACE, GROIN, UNDERARMS, Disp: , Rfl: 2 .  zinc sulfate 220 (50 Zn) MG capsule, Take 220 mg by mouth daily. , Disp: , Rfl:  .  nadolol (CORGARD) 20 MG tablet, Take 10 mg by mouth daily. , Disp: , Rfl:   Review of Systems  HENT: Positive for postnasal drip and sinus pressure.   Respiratory: Negative.   Gastrointestinal: Negative.   Neurological: Negative.     Social History   Tobacco Use  . Smoking status: Former Smoker    Types: Cigarettes  . Smokeless tobacco: Never Used  . Tobacco comment: formerly tried smoking cigarettes, no regular use  Substance Use Topics  . Alcohol use: No      Objective:   BP 123/70 (BP Location: Left Arm, Patient Position:  Sitting, Cuff Size: Normal)   Pulse 71   Temp 98 F (36.7 C) (Oral)   Wt 152 lb 12.8 oz (69.3 kg)   SpO2 99%   BMI 27.95 kg/m  Vitals:   05/07/18 0949  BP: 123/70  Pulse: 71  Temp: 98 F (36.7 C)  TempSrc: Oral  SpO2: 99%  Weight: 152 lb 12.8 oz (69.3 kg)     Physical Exam Vitals signs reviewed.  Constitutional:      General: She is not in acute distress.    Appearance: She is well-developed. She is not diaphoretic.  HENT:     Head: Normocephalic and atraumatic.     Nose: Congestion and rhinorrhea present.     Mouth/Throat:     Mouth: Mucous membranes are moist.     Pharynx: Posterior oropharyngeal erythema (mild) present. No oropharyngeal exudate.  Eyes:     Conjunctiva/sclera: Conjunctivae normal.  Neck:     Musculoskeletal: Neck supple.     Thyroid: No  thyromegaly.  Cardiovascular:     Rate and Rhythm: Normal rate and regular rhythm.     Heart sounds: Normal heart sounds. No murmur.  Pulmonary:     Effort: Pulmonary effort is normal. No respiratory distress.     Breath sounds: Normal breath sounds. No wheezing or rhonchi.  Abdominal:     General: There is no distension.     Palpations: Abdomen is soft.     Tenderness: There is no abdominal tenderness.  Lymphadenopathy:     Cervical: No cervical adenopathy.  Skin:    General: Skin is warm and dry.     Capillary Refill: Capillary refill takes less than 2 seconds.     Findings: No rash.  Neurological:     General: No focal deficit present.     Mental Status: She is alert and oriented to person, place, and time.  Psychiatric:        Mood and Affect: Mood normal.        Behavior: Behavior normal.        Assessment & Plan   Problem List Items Addressed This Visit      Cardiovascular and Mediastinum   Essential (primary) hypertension    Well-controlled Continue current medications Recheck renal function panel for elevated creatinine as below Follow-up at CPE in 3 months      Relevant Orders   Renal Function Panel     Respiratory   Seasonal allergic rhinitis due to pollen    Sore throat seems to be related to postnasal drip related to allergic rhinitis Advised on nasal saline and regular use of antihistamine       Other Visit Diagnoses    Elevated serum creatinine    -  Primary   Relevant Orders   Renal Function Panel    -Unclear etiology -Recheck renal function panel -Avoid NSAIDs and stay well-hydrated   Return in about 3 months (around 08/07/2018) for CPE (as scheduled).   The entirety of the information documented in the History of Present Illness, Review of Systems and Physical Exam were personally obtained by me. Portions of this information were initially documented by [facility], CMA and reviewed by me for thoroughness and accuracy.     Virginia Crews, MD, MPH [facility] 05/07/2018 10:13 AM

## 2018-05-07 NOTE — Assessment & Plan Note (Signed)
Sore throat seems to be related to postnasal drip related to allergic rhinitis Advised on nasal saline and regular use of antihistamine

## 2018-05-07 NOTE — Patient Instructions (Signed)
Serum Creatinine Test Why am I having this test? Creatinine is a waste product of normal muscle activity (contraction). Your kidneys filter creatinine from your blood and remove it from your body through urination. This test is a way to measure kidney function. Your creatinine is usually done with other tests of your kidneys (renal function studies). Your health care provider may recommend this test if he or she suspects that you have a condition that is affecting your kidney function. This test may also be done as a part of routine blood work to assess your overall health or to monitor certain medical treatments. Normal blood (serum) creatinine levels depend on the muscle mass of your body. In general, men and people with larger muscle mass will have a slightly higher creatinine level. What is being tested? This test measures the amount of creatinine in your blood. What kind of sample is taken?     A blood sample is required for this test. It is usually collected by inserting a needle into a blood vessel or by sticking a finger with a small needle. For children, the blood sample is usually collected by sticking the child's heel with a small needle (heel stick). Tell a health care provider about:  All medicines you are taking, including vitamins, herbs, eye drops, creams, and over-the-counter medicines. How are the results reported? Your test results will be reported as a value that indicates the amount of creatinine in your blood. Your health care provider will compare your results to normal ranges that were established after testing a large group of people (reference ranges). Reference ranges may vary among labs and hospitals. For this test, common normal reference ranges are:  Children or adolescents: ? Newborn: 0.3-1.2 mg/dL. ? Infant: 0.2-0.4 mg/dL. ? Child: 0.3-0.7 mg/dL. ? Adolescent: 0.5-1 mg/dL.  Adult female: 0.5-1.1 mg/dL.  Adult female: 0.6-1.2 mg/dL. The reference range may be  higher in people who do resistance exercise to increase their muscle mass. Elderly people who have lost muscle mass may have lower values. What do the results mean? Abnormally high levels of serum creatinine can be caused by many health conditions. These may include:  Kidney disease.  Urinary tract obstruction.  Lower-than-normal blood flow to the kidneys.  Kidney damage from the release of molecules into your bloodstream that is caused by muscle damage (rhabdomyolysis).  A condition that causes enlarged bones (acromegaly).  Gigantism.  Diabetes. Abnormally low levels of serum creatinine can result from decreased muscle mass. Talk with your health care provider about what your results mean. Questions to ask your health care provider Ask your health care provider, or the department that is doing the test:  When will my results be ready?  How will I get my results?  What are my treatment options?  What other tests do I need?  What are my next steps? Summary  Creatinine is a waste product of normal muscle activity (contraction). Your kidneys filter creatinine from your blood (serum) and remove it through urination.  You may have this test to check whether your kidneys are working as they should. The test may also be used to evaluate your overall health or to monitor certain medical treatments.  Abnormally high levels of serum creatinine can be caused by many health conditions. Abnormally low levels can result from decreased muscle mass.  Talk with your health care provider about what your test results mean. This information is not intended to replace advice given to you by your health care provider. Make  sure you discuss any questions you have with your health care provider. Document Released: 03/14/2004 Document Revised: 11/14/2016 Document Reviewed: 11/14/2016 Elsevier Interactive Patient Education  [employer].

## 2018-05-08 LAB — RENAL FUNCTION PANEL
Albumin: 3.3 g/dL — ABNORMAL LOW (ref 3.7–4.7)
BUN / CREAT RATIO: 5 — AB (ref 12–28)
BUN: 5 mg/dL — AB (ref 8–27)
CO2: 21 mmol/L (ref 20–29)
CREATININE: 0.98 mg/dL (ref 0.57–1.00)
Calcium: 9 mg/dL (ref 8.7–10.3)
Chloride: 104 mmol/L (ref 96–106)
GFR calc non Af Amer: 57 mL/min/{1.73_m2} — ABNORMAL LOW (ref >59)
GFR, EST AFRICAN AMERICAN: 66 mL/min/{1.73_m2} (ref >59)
Glucose: 82 mg/dL (ref 65–99)
Phosphorus: 3.2 mg/dL (ref 3.0–4.3)
Potassium: 4.1 mmol/L (ref 3.5–5.2)
Sodium: 138 mmol/L (ref 134–144)

## 2018-05-08 NOTE — Unmapped (Signed)
Reason for call: Follow up on [facility] assistance application for Xifaxan  ??  The Medication Assistance Program has reached the maximum number of attempts to obtain necessary information from the??Patient??in regards to the manufacturer assistance procurement process. Anticipated copay for Xifaxan is $897.84. MfR assistance application needed to see if pt can be approved for assistance. Made another call attempt to reach pt at phone number [phone]. I left another VM request for pt to return call. Contact information provided.     Vertell Limber RN, BSN  Nursing Care Coordinator   Pharmacy Adult GI Medicine  [facility]  [address]   [place], [place] [address]  [phone]

## 2018-05-10 ENCOUNTER — Encounter: Payer: Self-pay | Admitting: Family Medicine

## 2018-05-10 ENCOUNTER — Ambulatory Visit (INDEPENDENT_AMBULATORY_CARE_PROVIDER_SITE_OTHER): Payer: Medicare Other | Admitting: Family Medicine

## 2018-05-10 VITALS — BP 118/73 | HR 78 | Temp 97.9°F | Wt 155.0 lb

## 2018-05-10 DIAGNOSIS — R05 Cough: Secondary | ICD-10-CM

## 2018-05-10 DIAGNOSIS — K7460 Unspecified cirrhosis of liver: Secondary | ICD-10-CM

## 2018-05-10 DIAGNOSIS — R059 Cough, unspecified: Secondary | ICD-10-CM

## 2018-05-10 MED ORDER — GUAIFENESIN-CODEINE 100-10 MG/5ML PO SOLN
5.0000 mL | Freq: Three times a day (TID) | ORAL | 0 refills | Status: DC | PRN
Start: 2018-05-10 — End: 2018-08-07

## 2018-05-10 NOTE — Progress Notes (Signed)
Patient: Shannon Sawyer Female    DOB: [birth year]   73 y.o.   MRN: [id number] Visit Date: 05/10/2018  Today's Provider: Vernie Murders, PA   Chief Complaint  Patient presents with  . Cough   Subjective:     Cough  This is a new problem. Episode onset: Wednesday. The problem has been gradually worsening. The problem occurs every few minutes. The cough is non-productive. Associated symptoms include a sore throat. Associated symptoms comments: Rib pain and congestion. Nothing aggravates the symptoms. Treatments tried: OTC medication for sore throat. The treatment provided mild relief.   Past Medical History:  Diagnosis Date  . Allergy   . Anemia    LOW PLATELET COUNTS  ([facility])  . Anxiety   . Carotid artery disease (HCC)    BILAT.        Marland Kitchen Chronic cough   . Cirrhosis, nonalcoholic (HCC)    PORTAL HTN, FATTY TISSUE   . Coronary artery disease   . DDD (degenerative disc disease), cervical    SPONDYLOLISTHESIS  . Depression   . GERD (gastroesophageal reflux disease)   . Headache    HX OCCULAR MIGRAINES  . Hypertension    ESSENTIAL  . Myocardial infarction ([place])   . Shortness of breath dyspnea    WITH EXERTION   . Tricuspid insufficiency    MODERATE    Past Surgical History:  Procedure Laterality Date  . ABDOMINAL HYSTERECTOMY    . ANTERIOR CERVICAL DECOMP/DISCECTOMY FUSION N/A 06/03/2014   Procedure: CERVICAL FIVE-CERVICAL SIX, CERVICAL SIX-CERVICAL SEVEN ANTERIOR CERVICAL DECOMPRESSION/DISCECTOMY ;  Surgeon: Karie Chimera, MD;  Location: [place] [facility]URO [facility]S;  Service: Neurosurgery;  Laterality: N/A;  . BREAST BIOPSY Left 09/02/2015   benign  . BREAST CYST ASPIRATION Left    neg  . CARDIAC CATHETERIZATION     2 STENTS 2013 IN [place]  . cardiac stents    . ESOPHAGOGASTRODUODENOSCOPY (EGD) WITH PROPOFOL N/A 02/21/2015   Procedure: ESOPHAGOGASTRODUODENOSCOPY (EGD) WITH PROPOFOL;  Surgeon: Josefine Class, MD;  Location: [facility] ENDOSCOPY;  Service:  Endoscopy;  Laterality: N/A;  . EYE SURGERY     Family History  Problem Relation Age of Onset  . Liver cancer Mother   . Lymphoma Father   . COPD Father   . COPD Brother   . Liver cancer Son   . Breast cancer Neg Hx   . Colon cancer Neg Hx    Allergies  Allergen Reactions  . Contrast Media  [Iodinated Diagnostic Agents] Rash    Blister rash   . Azelastine Swelling  . Flonase [Fluticasone Propionate] Swelling  . Prednisone Swelling  . Shellfish Allergy Swelling and Other (See Comments)    Per spouse, determined on allergy test.  . Tramadol Nausea Only    Current Outpatient Medications:  .  acetaminophen (TYLENOL) 500 MG tablet, Take 500 mg by mouth every 6 (six) hours as needed. , Disp: , Rfl:  .  aspirin EC 81 MG tablet, Take 81 mg by mouth daily. , Disp: , Rfl:  .  cetirizine (ZYRTEC) 10 MG tablet, Take 10 mg by mouth daily., Disp: , Rfl:  .  chlorhexidine gluconate, MEDLINE KIT, (PERIDEX) 0.12 % solution, Rinse mouth with 15 cc for 30 seconds twice a day., Disp: 473 mL, Rfl: 0 .  lactulose (CHRONULAC) 10 GM/15ML solution, Take 20 g by mouth 2 (two) times daily., Disp: , Rfl:  .  losartan (COZAAR) 25 MG tablet, Take 25 mg by  mouth daily., Disp: , Rfl:  .  lovastatin (MEVACOR) 40 MG tablet, Take 40 mg by mouth every evening. TAKE ONE TABLET BY MOUTH ONCE DAILY WITH DINNER, Disp: , Rfl:  .  nitroGLYCERIN (NITROSTAT) 0.4 MG SL tablet, Place 0.4 mg under the tongue every 5 (five) minutes as needed for chest pain. , Disp: , Rfl:  .  omeprazole (PRILOSEC) 20 MG capsule, Take 20 mg by mouth daily. , Disp: , Rfl:  .  spironolactone (ALDACTONE) 25 MG tablet, Take 25 mg by mouth 2 (two) times daily. , Disp: , Rfl:  .  triamcinolone cream (KENALOG) 0.1 %, APPLY ON THE SKIN TWICE A DAY AS NEEDED ITCHING. AVOID FACE, GROIN, UNDERARMS, Disp: , Rfl: 2 .  zinc sulfate 220 (50 Zn) MG capsule, Take 220 mg by mouth daily. , Disp: , Rfl:  .  nadolol (CORGARD) 20 MG tablet, Take 10 mg by mouth  daily. , Disp: , Rfl:   Review of Systems  Constitutional: Negative.   HENT: Positive for congestion and sore throat.   Respiratory: Positive for cough.   Cardiovascular: Negative.   Musculoskeletal: Negative.    Social History   Tobacco Use  . Smoking status: Former Smoker    Types: Cigarettes  . Smokeless tobacco: Never Used  . Tobacco comment: formerly tried smoking cigarettes, no regular use  Substance Use Topics  . Alcohol use: No     Objective:   BP 118/73 (BP Location: Right Arm, Patient Position: Sitting, Cuff Size: Normal)   Pulse 78   Temp 97.9 F (36.6 C) (Oral)   Wt 155 lb (70.3 kg)   SpO2 99%   BMI 28.35 kg/m  Vitals:   05/10/18 1014  BP: 118/73  Pulse: 78  Temp: 97.9 F (36.6 C)  TempSrc: Oral  SpO2: 99%  Weight: 155 lb (70.3 kg)   Physical Exam Constitutional:      General: She is not in acute distress.    Appearance: She is well-developed.  HENT:     Head: Normocephalic and atraumatic.     Right Ear: Hearing normal.     Left Ear: Hearing normal.     Nose: Nose normal.     Mouth/Throat:     Pharynx: Oropharynx is clear.  Eyes:     General: Lids are normal. No scleral icterus.       Right eye: No discharge.        Left eye: No discharge.     Conjunctiva/sclera: Conjunctivae normal.  Neck:     Musculoskeletal: Neck supple.  Cardiovascular:     Rate and Rhythm: Normal rate and regular rhythm.     Heart sounds: Normal heart sounds.  Pulmonary:     Effort: Pulmonary effort is normal. No respiratory distress.     Breath sounds: Normal breath sounds.  Abdominal:     General: Bowel sounds are normal.  Musculoskeletal: Normal range of motion.  Lymphadenopathy:     Cervical: No cervical adenopathy.  Skin:    Findings: No lesion or rash.  Neurological:     Mental Status: She is alert and oriented to person, place, and time.  Psychiatric:        Speech: Speech normal.        Behavior: Behavior normal.        Thought Content: Thought  content normal.       Assessment & Plan    1. Cough Has a chronic cough with flare over the past 3-4 days. No fever  or sputum production. No wheeze, rales or rhonchi at the present. No rhinorrhea or sore throat. Has to be cautious about OTC medications due to liver cirrhosis with history of encephalopathy and varices. Will give Robitussin-AC to use at night and should recheck if any fever or purulent sputum production. - guaiFENesin-codeine 100-10 MG/5ML syrup; Take 5 mLs by mouth 3 (three) times daily as needed for cough.  Dispense: 120 mL; Refill: 0  2. Cirrhosis of liver not due to alcohol (HCC) Followed by Dr. Drue Novel at [facility] liver clinic.      Vernie Murders, PA  [facility]

## 2018-05-12 DIAGNOSIS — I2510 Atherosclerotic heart disease of native coronary artery without angina pectoris: Secondary | ICD-10-CM | POA: Diagnosis not present

## 2018-05-12 DIAGNOSIS — I6523 Occlusion and stenosis of bilateral carotid arteries: Secondary | ICD-10-CM | POA: Diagnosis not present

## 2018-05-12 DIAGNOSIS — I10 Essential (primary) hypertension: Secondary | ICD-10-CM | POA: Diagnosis not present

## 2018-05-12 DIAGNOSIS — E782 Mixed hyperlipidemia: Secondary | ICD-10-CM | POA: Diagnosis not present

## 2018-05-26 NOTE — Unmapped (Signed)
Reason for call: Follow up on [facility] assistance application for Xifaxan  ??  The Medication Assistance Program has reached the maximum number of attempts to obtain necessary information from the??Patient??in regards to the manufacturer assistance procurement process. Anticipated copay for Xifaxan is $897.84. Trying to reach pt in attempt to see if she will meet eligibility for [facility] Assistance and obtain completed application. I called pt at phone number [phone] and left a VM request for pt to please return call. Contact number provided.       Vertell Limber RN, BSN  Nursing Care Coordinator   Pharmacy Adult GI Medicine  [facility]  [address]   [place], [place] [address]  [phone]

## 2018-05-29 NOTE — Unmapped (Addendum)
Reason for call: Follow up on [facility] assistance application for Xifaxan  ??  The Medication Assistance Program has reached the maximum number of attempts to obtain necessary information from the??Patient??in regards to the manufacturer assistance procurement process. Anticipated copay for Xifaxan is $897.84. Received a call back from pt. Discussed MFR application and financial documents needed. Pt stated she will complete the application and get it turned  in. Pt stated she will put it in the mail by the end of the week. Notified pt if she has any further questions to please give me a call back.     June 12, 2018 2:30 PM  Mfr assistance paper work and financial paper work was received from pt and forwarded to [place] with MAP Team.       Vertell Limber RN, BSN  Nursing Care Coordinator   Pharmacy Adult GI Medicine  [facility]  [address]   [place], [place] [address]  [phone]

## 2018-06-17 NOTE — Unmapped (Signed)
Follow-up call to pt's voicemail message per request of [facility] F.,RN coordinator. Patient called to confirm next appts to [facility] and if there were any changes to schedule. Pt scheduled on 07/09/2018 at [facility] for 9:00am [place] and 10:10am visit with Dr. Piedad Climes. Informed pt that schedule changes may occur due to COVID19. She was advised to look for notifications in [facility]. She explained she's had a bad cough since March 7th. Pt was prescribed Codeine by her PCP and only takes now as needed. Cough occurs throughout the day and night, but no longer experiences rib pain at the moment. Denied any fever, SOB or difficulty breathing currently. She states she has been out in public, but wears a mask and does not believe cough is related to COVID19. Pt has been unable to get through by phone to PCP's office for follow-up regarding cough. Advised patient to try and contact PCP today for further evaluation of symptoms. Provided number for [facility] COVID-19 Helpline at [phone], should she need resource for testing. If new or worsening symptoms occur, pt instructed to call 911 or go to nearest urgent care/ED. Emphasized importance of hand washing for 30 seconds and avoidance of large groups/gatherings. Pt verbalized understanding and stated she will call her PCP soon. Pt's concerns routed to [facility] F.,RN coordinator. No other questions/concerns at this time.

## 2018-06-18 ENCOUNTER — Other Ambulatory Visit: Payer: Self-pay | Admitting: Family Medicine

## 2018-06-18 DIAGNOSIS — R05 Cough: Secondary | ICD-10-CM

## 2018-06-18 DIAGNOSIS — R059 Cough, unspecified: Secondary | ICD-10-CM

## 2018-06-18 NOTE — Telephone Encounter (Signed)
Pt needs refill   Guaifenesin codeine 100-10 syrup  [facility]  CB#  [phone]  Thanks Con Memos

## 2018-06-18 NOTE — Telephone Encounter (Signed)
No refills on controlled substance containing cough medications

## 2018-06-20 ENCOUNTER — Other Ambulatory Visit: Payer: Self-pay | Admitting: Family Medicine

## 2018-06-20 DIAGNOSIS — R05 Cough: Secondary | ICD-10-CM

## 2018-06-20 DIAGNOSIS — R059 Cough, unspecified: Secondary | ICD-10-CM

## 2018-07-08 NOTE — Unmapped (Signed)
Patient given instructions on phone visit and chart updated. Patient states understanding of instructions

## 2018-07-09 ENCOUNTER — Encounter: Admit: 2018-07-09 | Discharge: 2018-07-09 | Payer: MEDICARE

## 2018-07-09 ENCOUNTER — Encounter: Admit: 2018-07-09 | Discharge: 2018-07-09 | Payer: MEDICARE | Attending: Gastroenterology | Primary: Gastroenterology

## 2018-07-09 DIAGNOSIS — K7689 Other specified diseases of liver: Secondary | ICD-10-CM | POA: Diagnosis not present

## 2018-07-09 DIAGNOSIS — K7460 Unspecified cirrhosis of liver: Secondary | ICD-10-CM | POA: Diagnosis not present

## 2018-07-11 NOTE — Unmapped (Signed)
Patient has been approved to receive XIFAXAN from the manufacturer from  07/11/18 until 03/05/19 and will be shipped directly to the patient's home.

## 2018-08-04 DIAGNOSIS — I6523 Occlusion and stenosis of bilateral carotid arteries: Secondary | ICD-10-CM | POA: Diagnosis not present

## 2018-08-05 ENCOUNTER — Ambulatory Visit: Payer: Medicare HMO

## 2018-08-06 ENCOUNTER — Ambulatory Visit: Payer: Medicare HMO

## 2018-08-07 ENCOUNTER — Ambulatory Visit (INDEPENDENT_AMBULATORY_CARE_PROVIDER_SITE_OTHER): Payer: Medicare Other | Admitting: Family Medicine

## 2018-08-07 ENCOUNTER — Ambulatory Visit (INDEPENDENT_AMBULATORY_CARE_PROVIDER_SITE_OTHER): Payer: Medicare Other

## 2018-08-07 ENCOUNTER — Other Ambulatory Visit: Payer: Self-pay

## 2018-08-07 ENCOUNTER — Encounter: Payer: Self-pay | Admitting: Family Medicine

## 2018-08-07 VITALS — BP 126/62 | HR 74 | Temp 98.0°F | Ht 62.0 in | Wt 158.8 lb

## 2018-08-07 DIAGNOSIS — D696 Thrombocytopenia, unspecified: Secondary | ICD-10-CM

## 2018-08-07 DIAGNOSIS — E782 Mixed hyperlipidemia: Secondary | ICD-10-CM

## 2018-08-07 DIAGNOSIS — I2510 Atherosclerotic heart disease of native coronary artery without angina pectoris: Secondary | ICD-10-CM

## 2018-08-07 DIAGNOSIS — K219 Gastro-esophageal reflux disease without esophagitis: Secondary | ICD-10-CM

## 2018-08-07 DIAGNOSIS — D509 Iron deficiency anemia, unspecified: Secondary | ICD-10-CM | POA: Diagnosis not present

## 2018-08-07 DIAGNOSIS — K7460 Unspecified cirrhosis of liver: Secondary | ICD-10-CM

## 2018-08-07 DIAGNOSIS — R739 Hyperglycemia, unspecified: Secondary | ICD-10-CM | POA: Diagnosis not present

## 2018-08-07 DIAGNOSIS — Z1239 Encounter for other screening for malignant neoplasm of breast: Secondary | ICD-10-CM

## 2018-08-07 DIAGNOSIS — I10 Essential (primary) hypertension: Secondary | ICD-10-CM

## 2018-08-07 DIAGNOSIS — I252 Old myocardial infarction: Secondary | ICD-10-CM

## 2018-08-07 DIAGNOSIS — J301 Allergic rhinitis due to pollen: Secondary | ICD-10-CM

## 2018-08-07 DIAGNOSIS — I6523 Occlusion and stenosis of bilateral carotid arteries: Secondary | ICD-10-CM

## 2018-08-07 DIAGNOSIS — Z0000 Encounter for general adult medical examination without abnormal findings: Secondary | ICD-10-CM

## 2018-08-07 MED ORDER — MONTELUKAST SODIUM 10 MG PO TABS: 10.0000 mg | ORAL_TABLET | Freq: Every day | ORAL | 3 refills | Status: DC

## 2018-08-07 NOTE — Assessment & Plan Note (Signed)
Well controlled Continue current medications Recheck metabolic panel 

## 2018-08-07 NOTE — Patient Instructions (Signed)
Preventive Care 73 Years and Older, Female Preventive care refers to lifestyle choices and visits with your health care provider that can promote health and wellness. What does preventive care include?  A yearly physical exam. This is also called an annual well check.  Dental exams once or twice a year.  Routine eye exams. Ask your health care provider how often you should have your eyes checked.  Personal lifestyle choices, including: ? Daily care of your teeth and gums. ? Regular physical activity. ? Eating a healthy diet. ? Avoiding tobacco and drug use. ? Limiting alcohol use. ? Practicing safe sex. ? Taking low-dose aspirin every day. ? Taking vitamin and mineral supplements as recommended by your health care provider. What happens during an annual well check? The services and screenings done by your health care provider during your annual well check will depend on your age, overall health, lifestyle risk factors, and family history of disease. Counseling Your health care provider may ask you questions about your:  Alcohol use.  Tobacco use.  Drug use.  Emotional well-being.  Home and relationship well-being.  Sexual activity.  Eating habits.  History of falls.  Memory and ability to understand (cognition).  Work and work Statistician.  Reproductive health.  Screening You may have the following tests or measurements:  Height, weight, and BMI.  Blood pressure.  Lipid and cholesterol levels. These may be checked every 5 years, or more frequently if you are over 30 years old.  Skin check.  Lung cancer screening. You may have this screening every year starting at age 27 if you have a 30-pack-year history of smoking and currently smoke or have quit within the past 15 years.  Colorectal cancer screening. All adults should have this screening starting at age 33 and continuing until age 46. You will have tests every 1-10 years, depending on your results and the  type of screening test. People at increased risk should start screening at an earlier age. Screening tests may include: ? Guaiac-based fecal occult blood testing. ? Fecal immunochemical test (FIT). ? Stool DNA test. ? Virtual colonoscopy. ? Sigmoidoscopy. During this test, a flexible tube with a tiny camera (sigmoidoscope) is used to examine your rectum and lower colon. The sigmoidoscope is inserted through your anus into your rectum and lower colon. ? Colonoscopy. During this test, a long, thin, flexible tube with a tiny camera (colonoscope) is used to examine your entire colon and rectum.  Hepatitis C blood test.  Hepatitis B blood test.  Sexually transmitted disease (STD) testing.  Diabetes screening. This is done by checking your blood sugar (glucose) after you have not eaten for a while (fasting). You may have this done every 1-3 years.  Bone density scan. This is done to screen for osteoporosis. You may have this done starting at age 37.  Mammogram. This may be done every 1-2 years. Talk to your health care provider about how often you should have regular mammograms. Talk with your health care provider about your test results, treatment options, and if necessary, the need for more tests. Vaccines Your health care provider may recommend certain vaccines, such as:  Influenza vaccine. This is recommended every year.  Tetanus, diphtheria, and acellular pertussis (Tdap, Td) vaccine. You may need a Td booster every 10 years.  Varicella vaccine. You may need this if you have not been vaccinated.  Zoster vaccine. You may need this after age 38.  Measles, mumps, and rubella (MMR) vaccine. You may need at least  one dose of MMR if you were born in 1957 or later. You may also need a second dose.  Pneumococcal 13-valent conjugate (PCV13) vaccine. One dose is recommended after age 24.  Pneumococcal polysaccharide (PPSV23) vaccine. One dose is recommended after age 24.  Meningococcal  vaccine. You may need this if you have certain conditions.  Hepatitis A vaccine. You may need this if you have certain conditions or if you travel or work in places where you may be exposed to hepatitis A.  Hepatitis B vaccine. You may need this if you have certain conditions or if you travel or work in places where you may be exposed to hepatitis B.  Haemophilus influenzae type b (Hib) vaccine. You may need this if you have certain conditions. Talk to your health care provider about which screenings and vaccines you need and how often you need them. This information is not intended to replace advice given to you by your health care provider. Make sure you discuss any questions you have with your health care provider. Document Released: 03/18/2015 Document Revised: 04/11/2017 Document Reviewed: 12/21/2014 Elsevier Interactive Patient Education  2019 [employer].

## 2018-08-07 NOTE — Assessment & Plan Note (Signed)
Chronic and stable Continue antihistamine daily Unable to tolerate nasal sprays Suspect that her chronic intermittent dry cough may be related to allergy symptoms Lungs are clear We will add Singulair 10 mg daily

## 2018-08-07 NOTE — Patient Instructions (Addendum)
Shannon Sawyer , Thank you for taking time to come for your Medicare Wellness Visit. I appreciate your ongoing commitment to your health goals. Please review the following plan we discussed and let me know if I can assist you in the future.   Screening recommendations/referrals: Colonoscopy: Up to date, due 07/2023 Mammogram: Up to date, due 11/2018 Bone Density: Up to date, due 09/2022 Recommended yearly ophthalmology/optometry visit for glaucoma screening and checkup Recommended yearly dental visit for hygiene and checkup  Vaccinations: Influenza vaccine: Up to date Pneumococcal vaccine: Completed series Tdap vaccine: Up to date, due 11/2024 Shingles vaccine: Pt declines today.     Advanced directives: Advance directive discussed with you today. Even though you declined this today please call our office should you change your mind and we can give you the proper paperwork for you to fill out.  Conditions/risks identified: 2:00 PM today with Dr Brita Romp.   Next appointment: 2:00 PM today with Dr Brita Romp.    Preventive Care 53 Years and Older, Female Preventive care refers to lifestyle choices and visits with your health care provider that can promote health and wellness. What does preventive care include?  A yearly physical exam. This is also called an annual well check.  Dental exams once or twice a year.  Routine eye exams. Ask your health care provider how often you should have your eyes checked.  Personal lifestyle choices, including:  Daily care of your teeth and gums.  Regular physical activity.  Eating a healthy diet.  Avoiding tobacco and drug use.  Limiting alcohol use.  Practicing safe sex.  Taking low-dose aspirin every day.  Taking vitamin and mineral supplements as recommended by your health care provider. What happens during an annual well check? The services and screenings done by your health care provider during your annual well check will depend  on your age, overall health, lifestyle risk factors, and family history of disease. Counseling  Your health care provider may ask you questions about your:  Alcohol use.  Tobacco use.  Drug use.  Emotional well-being.  Home and relationship well-being.  Sexual activity.  Eating habits.  History of falls.  Memory and ability to understand (cognition).  Work and work Statistician.  Reproductive health. Screening  You may have the following tests or measurements:  Height, weight, and BMI.  Blood pressure.  Lipid and cholesterol levels. These may be checked every 5 years, or more frequently if you are over 81 years old.  Skin check.  Lung cancer screening. You may have this screening every year starting at age 63 if you have a 30-pack-year history of smoking and currently smoke or have quit within the past 15 years.  Fecal occult blood test (FOBT) of the stool. You may have this test every year starting at age 62.  Flexible sigmoidoscopy or colonoscopy. You may have a sigmoidoscopy every 5 years or a colonoscopy every 10 years starting at age 33.  Hepatitis C blood test.  Hepatitis B blood test.  Sexually transmitted disease (STD) testing.  Diabetes screening. This is done by checking your blood sugar (glucose) after you have not eaten for a while (fasting). You may have this done every 1-3 years.  Bone density scan. This is done to screen for osteoporosis. You may have this done starting at age 83.  Mammogram. This may be done every 1-2 years. Talk to your health care provider about how often you should have regular mammograms. Talk with your health care provider about your  test results, treatment options, and if necessary, the need for more tests. Vaccines  Your health care provider may recommend certain vaccines, such as:  Influenza vaccine. This is recommended every year.  Tetanus, diphtheria, and acellular pertussis (Tdap, Td) vaccine. You may need a Td  booster every 10 years.  Zoster vaccine. You may need this after age 59.  Pneumococcal 13-valent conjugate (PCV13) vaccine. One dose is recommended after age 35.  Pneumococcal polysaccharide (PPSV23) vaccine. One dose is recommended after age 59. Talk to your health care provider about which screenings and vaccines you need and how often you need them. This information is not intended to replace advice given to you by your health care provider. Make sure you discuss any questions you have with your health care provider. Document Released: 03/18/2015 Document Revised: 11/09/2015 Document Reviewed: 12/21/2014 Elsevier Interactive Patient Education  2017 [employer] Prevention in the Home Falls can cause injuries. They can happen to people of all ages. There are many things you can do to make your home safe and to help prevent falls. What can I do on the outside of my home?  Regularly fix the edges of walkways and driveways and fix any cracks.  Remove anything that might make you trip as you walk through a door, such as a raised step or threshold.  Trim any bushes or trees on the path to your home.  Use bright outdoor lighting.  Clear any walking paths of anything that might make someone trip, such as rocks or tools.  Regularly check to see if handrails are loose or broken. Make sure that both sides of any steps have handrails.  Any raised decks and porches should have guardrails on the edges.  Have any leaves, snow, or ice cleared regularly.  Use sand or salt on walking paths during winter.  Clean up any spills in your garage right away. This includes oil or grease spills. What can I do in the bathroom?  Use night lights.  Install grab bars by the toilet and in the tub and shower. Do not use towel bars as grab bars.  Use non-skid mats or decals in the tub or shower.  If you need to sit down in the shower, use a plastic, non-slip stool.  Keep the floor dry. Clean up  any water that spills on the floor as soon as it happens.  Remove soap buildup in the tub or shower regularly.  Attach bath mats securely with double-sided non-slip rug tape.  Do not have throw rugs and other things on the floor that can make you trip. What can I do in the bedroom?  Use night lights.  Make sure that you have a light by your bed that is easy to reach.  Do not use any sheets or blankets that are too big for your bed. They should not hang down onto the floor.  Have a firm chair that has side arms. You can use this for support while you get dressed.  Do not have throw rugs and other things on the floor that can make you trip. What can I do in the kitchen?  Clean up any spills right away.  Avoid walking on wet floors.  Keep items that you use a lot in easy-to-reach places.  If you need to reach something above you, use a strong step stool that has a grab bar.  Keep electrical cords out of the way.  Do not use floor polish or wax that  makes floors slippery. If you must use wax, use non-skid floor wax.  Do not have throw rugs and other things on the floor that can make you trip. What can I do with my stairs?  Do not leave any items on the stairs.  Make sure that there are handrails on both sides of the stairs and use them. Fix handrails that are broken or loose. Make sure that handrails are as long as the stairways.  Check any carpeting to make sure that it is firmly attached to the stairs. Fix any carpet that is loose or worn.  Avoid having throw rugs at the top or bottom of the stairs. If you do have throw rugs, attach them to the floor with carpet tape.  Make sure that you have a light switch at the top of the stairs and the bottom of the stairs. If you do not have them, ask someone to add them for you. What else can I do to help prevent falls?  Wear shoes that:  Do not have high heels.  Have rubber bottoms.  Are comfortable and fit you well.  Are  closed at the toe. Do not wear sandals.  If you use a stepladder:  Make sure that it is fully opened. Do not climb a closed stepladder.  Make sure that both sides of the stepladder are locked into place.  Ask someone to hold it for you, if possible.  Clearly mark and make sure that you can see:  Any grab bars or handrails.  First and last steps.  Where the edge of each step is.  Use tools that help you move around (mobility aids) if they are needed. These include:  Canes.  Walkers.  Scooters.  Crutches.  Turn on the lights when you go into a dark area. Replace any light bulbs as soon as they burn out.  Set up your furniture so you have a clear path. Avoid moving your furniture around.  If any of your floors are uneven, fix them.  If there are any pets around you, be aware of where they are.  Review your medicines with your doctor. Some medicines can make you feel dizzy. This can increase your chance of falling. Ask your doctor what other things that you can do to help prevent falls. This information is not intended to replace advice given to you by your health care provider. Make sure you discuss any questions you have with your health care provider. Document Released: 12/16/2008 Document Revised: 07/28/2015 Document Reviewed: 03/26/2014 Elsevier Interactive Patient Education  2017 [employer].

## 2018-08-07 NOTE — Assessment & Plan Note (Signed)
Chronic and well-controlled Continue daily PPI Followed by GI Ankylosed dose for the shortest amount of time with PPI therapy

## 2018-08-07 NOTE — Progress Notes (Signed)
Subjective:   Shannon Sawyer is a 73 y.o. female who presents for Medicare Annual (Subsequent) preventive examination.  Review of Systems:  N/A  Cardiac Risk Factors include: advanced age (>53men, >33 women);dyslipidemia;hypertension     Objective:     Vitals: BP 126/62 (BP Location: Right Arm)   Pulse 74   Temp 98 F (36.7 C) (Oral)   Wt 158 lb 12.8 oz (72 kg)   BMI 29.04 kg/m   Body mass index is 29.04 kg/m.  Advanced Directives 08/07/2018 08/02/2017 11/16/2014 09/13/2014 09/13/2014 05/21/2014  Does Patient Have a Medical Advance Directive? No No No No No No  Would patient like information on creating a medical advance directive? No - Patient declined No - Patient declined No - patient declined information;Yes - Transport planner given No - patient declined information - Yes English as a second language teacher given    Tobacco Social History   Tobacco Use  Smoking Status Former Smoker  . Types: Cigarettes  Smokeless Tobacco Never Used  Tobacco Comment   formerly tried smoking cigarettes, no regular use     Counseling given: Not Answered Comment: formerly tried smoking cigarettes, no regular use   Clinical Intake:  Pre-visit preparation completed: Yes  Pain : No/denies pain Pain Score: 0-No pain     Nutritional Status: BMI 25 -29 Overweight Nutritional Risks: None Diabetes: No  How often do you need to have someone help you when you read instructions, pamphlets, or other written materials from your doctor or pharmacy?: 1 - Never  Interpreter Needed?: No  Information entered by :: [facility], LPN  Past Medical History:  Diagnosis Date  . Allergy   . Anemia    LOW PLATELET COUNTS  ([facility])  . Anxiety   . Carotid artery disease (HCC)    BILAT.        Marland Kitchen Chronic cough   . Cirrhosis, nonalcoholic (HCC)    PORTAL HTN, FATTY TISSUE   . Coronary artery disease   . DDD (degenerative disc disease), cervical    SPONDYLOLISTHESIS  . Depression   .  GERD (gastroesophageal reflux disease)   . Headache    HX OCCULAR MIGRAINES  . Hypertension    ESSENTIAL  . Myocardial infarction (HCC)   . Shortness of breath dyspnea    WITH EXERTION   . Tricuspid insufficiency    MODERATE    Past Surgical History:  Procedure Laterality Date  . ABDOMINAL HYSTERECTOMY    . ANTERIOR CERVICAL DECOMP/DISCECTOMY FUSION N/A 06/03/2014   Procedure: CERVICAL FIVE-CERVICAL SIX, CERVICAL SIX-CERVICAL SEVEN ANTERIOR CERVICAL DECOMPRESSION/DISCECTOMY ;  Surgeon: Aliene Beams, MD;  Location: [facility]UR[facility];  Service: Neurosurgery;  Laterality: N/A;  . BREAST BIOPSY Left 09/02/2015   benign  . BREAST CYST ASPIRATION Left    neg  . CARDIAC CATHETERIZATION     2 STENTS 2013 IN [place]  . cardiac stents    . ESOPHAGOGASTRODUODENOSCOPY (EGD) WITH PROPOFOL N/A 02/21/2015   Procedure: ESOPHAGOGASTRODUODENOSCOPY (EGD) WITH PROPOFOL;  Surgeon: Elnita Maxwell, MD;  Location: [facility] ENDOSCOPY;  Service: Endoscopy;  Laterality: N/A;  . EYE SURGERY     Family History  Problem Relation Age of Onset  . Liver cancer Mother   . Lymphoma Father   . COPD Father   . COPD Brother   . Liver cancer Son   . Breast cancer Neg Hx   . Colon cancer Neg Hx    Social History   Socioeconomic History  . Marital status: Married  Spouse name: Dorene Sorrow  . Number of children: 2  . Years of education: Not on file  . Highest education level: Some college, no degree  Occupational History  . 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 Occupation: retired  Engineer, production  . Financial resource strain: Not hard at all  . Food insecurity:    Worry: Never true    Inability: Never true  . Transportation needs:    Medical: No    Non-medical: No  Tobacco Use  . Smoking status: Former Smoker    Types: Cigarettes  . Smokeless tobacco: Never Used  . Tobacco comment: formerly tried smoking cigarettes, no regular use  Substance and Sexual Activity  . Alcohol use: No  . Drug use:  No  . Sexual activity: Yes    Partners: Male    Birth control/protection: Post-menopausal, Surgical  Lifestyle  . Physical activity:    Days per week: 0 days    Minutes per session: 0 min  . Stress: Not at all  Relationships  . Social connections:    Talks on phone: Patient refused    Gets together: Patient refused    Attends religious service: Patient refused    Active member of club or organization: Patient refused    Attends meetings of clubs or organizations: Patient refused    Relationship status: Patient refused  Other Topics Concern  . Not on file  Social History Narrative   One child was stillborn, and one son died of liver cancer. Pt has one living biological child, and 2 stepchildren.    Outpatient Encounter Medications as of 08/07/2018  Medication Sig  . acetaminophen (TYLENOL) 500 MG tablet Take 500 mg by mouth every 6 (six) hours as needed.   Marland Kitchen aspirin EC 81 MG tablet Take 81 mg by mouth daily.   . cetirizine (ZYRTEC) 10 MG tablet Take 10 mg by mouth daily.  Marland Kitchen lactulose (CHRONULAC) 10 GM/15ML solution Take 20 g by mouth 2 (two) times daily.  Marland Kitchen losartan (COZAAR) 25 MG tablet Take 25 mg by mouth daily.  Marland Kitchen lovastatin (MEVACOR) 40 MG tablet Take 40 mg by mouth every evening. TAKE ONE TABLET BY MOUTH ONCE DAILY WITH DINNER  . nadolol (CORGARD) 20 MG tablet Take 10 mg by mouth daily.   . nitroGLYCERIN (NITROSTAT) 0.4 MG SL tablet Place 0.4 mg under the tongue every 5 (five) minutes as needed for chest pain.   Marland Kitchen omeprazole (PRILOSEC) 20 MG capsule Take 20 mg by mouth daily.   . rifaximin (XIFAXAN) 200 MG tablet Take 400 mg by mouth daily.  Marland Kitchen spironolactone (ALDACTONE) 25 MG tablet Take 25 mg by mouth 2 (two) times daily.   Marland Kitchen triamcinolone cream (KENALOG) 0.1 % APPLY ON THE SKIN TWICE A DAY AS NEEDED ITCHING. AVOID FACE, GROIN, UNDERARMS  . Zinc Sulfate (ZINC 15 PO) Take by mouth daily. Unsure of dose  . chlorhexidine gluconate, MEDLINE KIT, (PERIDEX) 0.12 % solution Rinse  mouth with 15 cc for 30 seconds twice a day. (Patient not taking: Reported on 08/07/2018)  . guaiFENesin-codeine 100-10 MG/5ML syrup Take 5 mLs by mouth 3 (three) times daily as needed for cough. (Patient not taking: Reported on 08/07/2018)   No facility-administered encounter medications on file as of 08/07/2018.     Activities of Daily Living In your present state of health, do you have any difficulty performing the following activities: 08/07/2018  Hearing? Y  Comment Wears bilateral hearing aids.   Vision? N  Comment Wears eye  glasses daily.   Difficulty concentrating or making decisions? Y  Walking or climbing stairs? Y  Comment Due to pain in legs.   Dressing or bathing? N  Doing errands, shopping? N  Preparing Food and eating ? N  Using the Toilet? N  In the past six months, have you accidently leaked urine? N  Do you have problems with loss of bowel control? N  Managing your Medications? N  Managing your Finances? N  Housekeeping or managing your Housekeeping? N  Some recent data might be hidden    Patient Care Team: Erasmo Downer, MD as PCP - General (Family Medicine) Domingo Madeira, OD as Consulting Physician (Optometry) Lamar Blinks, MD as Consulting Physician (Cardiology) Woodfin Ganja, MD as Referring Physician (Internal Medicine)    Assessment:   This is a routine wellness examination for Shannon Sawyer.  Exercise Activities and Dietary recommendations Current Exercise Habits: The patient does not participate in regular exercise at present, Exercise limited by: None identified  Goals    . DIET - REDUCE SODIUM INTAKE     Recommend to continue to cut back on sodium intake to less than 8 grams a day.     . Prevent falls     Recommend to remove any items from the home that may cause slips or trips.       Fall Risk: Fall Risk  08/07/2018 08/02/2017 07/11/2017  Falls in the past year? 1 No No  Number falls in past yr: 0 - -  Injury with Fall? 0 - -  Follow up Falls  prevention discussed - -    FALL RISK PREVENTION PERTAINING TO THE HOME:  Any stairs in or around the home? Yes  If so, are there any without handrails? Yes   Home free of loose throw rugs in walkways, pet beds, electrical cords, etc? Yes  Adequate lighting in your home to reduce risk of falls? Yes   ASSISTIVE DEVICES UTILIZED TO PREVENT FALLS:  Life alert? Yes  Use of a cane, walker or w/c? No  Grab bars in the bathroom? No  Shower chair or bench in shower? No  Elevated toilet seat or a handicapped toilet? No    TIMED UP AND GO:  Was the test performed? No .    Depression Screen PHQ 2/9 Scores 08/07/2018 08/07/2018 08/02/2017 07/11/2017  PHQ - 2 Score 0 0 0 1  PHQ- 9 Score 0 - 3 8     Cognitive Function: Declined today.         Immunization History  Administered Date(s) Administered  . Hepatitis A 04/22/2014, 05/20/2014  . Hepatitis B 04/22/2014, 05/20/2014, 10/21/2014  . Influenza Split 12/16/2014  . Influenza, High Dose Seasonal PF 11/29/2015, 12/06/2016, 01/11/2018  . Influenza-Unspecified 01/20/2013  . Pneumococcal Conjugate-13 04/04/2015  . Pneumococcal Polysaccharide-23 06/10/2013  . Tdap 05/11/2014    Qualifies for Shingles Vaccine? Yes . Due for Shingrix. Education has been provided regarding the importance of this vaccine. Pt has been advised to call insurance company to determine out of pocket expense. Advised may also receive vaccine at local pharmacy or Health Dept. Verbalized acceptance and understanding.  Tdap: Up to date  Flu Vaccine: Up to date  Pneumococcal Vaccine: Up to date  Screening Tests Health Maintenance  Topic Date Due  . Hepatitis C Screening  02-07-1946  . INFLUENZA VACCINE  10/04/2018  . MAMMOGRAM  11/07/2018  . DEXA SCAN  09/25/2022  . COLONOSCOPY  07/11/2023  . TETANUS/TDAP  05/10/2024  .  PNA vac Low Risk Adult  Completed    Cancer Screenings:  Colorectal Screening: Completed 07/10/13. Repeat every 10 years.  Mammogram:  Completed 11/06/16.   Bone Density: Completed 09/24/17. Results reflect OSTEOPENIA. Repeat every 5 years. .  Lung Cancer Screening: (Low Dose CT Chest recommended if Age 97-80 years, 30 pack-year currently smoking OR have quit w/in 15years.) does not qualify.   Additional Screening:  Hepatitis C Screening: does qualify; however had this completed @ [facility]. Unable to obtain records.   Vision Screening: Recommended annual ophthalmology exams for early detection of glaucoma and other disorders of the eye.  Dental Screening: Recommended annual dental exams for proper oral hygiene  Community Resource Referral:  CRR required this visit?  No       Plan:  I have personally reviewed and addressed the Medicare Annual Wellness questionnaire and have noted the following in the patient's chart:  A. Medical and social history B. Use of alcohol, tobacco or illicit drugs  C. Current medications and supplements D. Functional ability and status E.  Nutritional status F.  Physical activity G. Advance directives H. List of other physicians I.  Hospitalizations, surgeries, and ER visits in previous 12 months J.  Vitals K. Screenings such as hearing and vision if needed, cognitive and depression L. Referrals and appointments   In addition, I have reviewed and discussed with patient certain preventive protocols, quality metrics, and best practice recommendations. A written personalized care plan for preventive services as well as general preventive health recommendations were provided to patient. Nurse Health Advisor  Signed,    Cayenne Breault [place], [place]  03/10/1094 Nurse Health Advisor   Nurse Notes: None.

## 2018-08-07 NOTE — Assessment & Plan Note (Signed)
Noted on previous lab work Check A1c today

## 2018-08-07 NOTE — Assessment & Plan Note (Signed)
Status post MI and 2 stent placement of LAD in 2013 Followed by [facility] cardiology On beta-blocker, ARB, aspirin, and statin Asymptomatic

## 2018-08-07 NOTE — Assessment & Plan Note (Signed)
Related to hepatic cirrhosis Stable on last visit Recheck CBC today Followed by [facility] hepatology

## 2018-08-07 NOTE — Assessment & Plan Note (Signed)
Followed by [facility] hepatology She is taking her medications as prescribed and states that things are doing well Her hepatic encephalopathy is doing better since adding Xifaxan She has had thrombocytopenia in the past, so we will recheck a CBC today as well as CMP

## 2018-08-07 NOTE — Assessment & Plan Note (Signed)
Followed by cardiology Repeat carotid artery ultrasound yesterday, but results are not able to be viewed at this time Asymptomatic

## 2018-08-07 NOTE — Assessment & Plan Note (Signed)
Followed by cardiology Asymptomatic Continue current medications

## 2018-08-07 NOTE — Progress Notes (Signed)
Patient: Shannon Sawyer, Female    DOB: 04-Feb-1946, 73 y.o.   MRN: [id number] Visit Date: 08/07/2018  Today's Provider: Shirlee Latch, MD   Chief Complaint  Patient presents with  . Annual Exam   Subjective:     Complete Physical Shannon Sawyer is a 73 y.o. female. She feels well. She reports exercising none. She reports she is sleeping well.  ----------------------------------------------------------- Due for mammogram  Followed by [facility] for her cirrhosis.  States that she had hepatitis C screening done by them previously.  Followed by Dr. Gwen Pounds with [facility] cardiology for her CAD and carotid artery stenosis.  States she had a carotid artery ultrasound yesterday and will find out the results tomorrow.  Review of Systems  Constitutional: Negative.   HENT: Negative.   Respiratory: Positive for cough.   Genitourinary: Negative.   Musculoskeletal: Negative.   Allergic/Immunologic: Positive for environmental allergies.  Psychiatric/Behavioral: Negative.     Social History   Socioeconomic History  . Marital status: Married    Spouse name: Dorene Sorrow  . Number of children: 2  . Years of education: Not on file  . Highest education level: Some college, no degree  Occupational History  . 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 Occupation: retired  Engineer, production  . Financial resource strain: Not hard at all  . Food insecurity:    Worry: Never true    Inability: Never true  . Transportation needs:    Medical: No    Non-medical: No  Tobacco Use  . Smoking status: Former Smoker    Types: Cigarettes  . Smokeless tobacco: Never Used  . Tobacco comment: formerly tried smoking cigarettes, no regular use  Substance and Sexual Activity  . Alcohol use: No  . Drug use: No  . Sexual activity: Yes    Partners: Male    Birth control/protection: Post-menopausal, Surgical  Lifestyle  . Physical activity:    Days per week: 0 days    Minutes per session: 0  min  . Stress: Not at all  Relationships  . Social connections:    Talks on phone: Patient refused    Gets together: Patient refused    Attends religious service: Patient refused    Active member of club or organization: Patient refused    Attends meetings of clubs or organizations: Patient refused    Relationship status: Patient refused  . Intimate partner violence:    Fear of current or ex partner: Patient refused    Emotionally abused: Patient refused    Physically abused: Patient refused    Forced sexual activity: Patient refused  Other Topics Concern  . Not on file  Social History Narrative   One child was stillborn, and one son died of liver cancer. Pt has one living biological child, and 2 stepchildren.    Past Medical History:  Diagnosis Date  . Allergy   . Anemia    LOW PLATELET COUNTS  ([facility])  . Anxiety   . Carotid artery disease (HCC)    BILAT.        Marland Kitchen Chronic cough   . Cirrhosis, nonalcoholic (HCC)    PORTAL HTN, FATTY TISSUE   . Coronary artery disease   . DDD (degenerative disc disease), cervical    SPONDYLOLISTHESIS  . Depression   . GERD (gastroesophageal reflux disease)   . Headache    HX OCCULAR MIGRAINES  . Hypertension    ESSENTIAL  .  Myocardial infarction (HCC)   . Shortness of breath dyspnea    WITH EXERTION   . Tricuspid insufficiency    MODERATE      Patient Active Problem List   Diagnosis Date Noted  . Seasonal allergic rhinitis due to pollen 05/07/2018  . Thrombocytopenia (HCC) 02/03/2018  . H/O acute myocardial infarction 07/11/2017  . GERD (gastroesophageal reflux disease) 07/11/2017  . Bradycardia 11/06/2016  . Severe anxiety with panic 11/15/2015  . Memory change 06/30/2015  . H/O acute pancreatitis 11/18/2014  . Cervical radiculopathy 06/03/2014  . TI (tricuspid incompetence) 02/12/2014  . Carotid artery narrowing 01/12/2014  . Cirrhosis of liver not due to alcohol (HCC) 01/12/2014  . Anemia, iron  deficiency 01/12/2014  . Breathlessness on exertion 01/12/2014  . Bilateral carotid artery stenosis 01/12/2014  . Cervical radiculitis 11/04/2013  . DDD (degenerative disc disease), cervical 11/04/2013  . SPL (spondylolisthesis) 11/04/2013  . Arteriosclerosis of coronary artery 05/20/2013  . Essential (primary) hypertension 05/20/2013  . Combined fat and carbohydrate induced hyperlipemia 05/20/2013  . NSTEMI (non-ST elevated myocardial infarction) (HCC) 12/04/2011  . Hyperglycemia 12/04/2011    Past Surgical History:  Procedure Laterality Date  . ABDOMINAL HYSTERECTOMY    . ANTERIOR CERVICAL DECOMP/DISCECTOMY FUSION N/A 06/03/2014   Procedure: CERVICAL FIVE-CERVICAL SIX, CERVICAL SIX-CERVICAL SEVEN ANTERIOR CERVICAL DECOMPRESSION/DISCECTOMY ;  Surgeon: Aliene Beams, MD;  Location: [facility]URO [facility]S;  Service: Neurosurgery;  Laterality: N/A;  . BREAST BIOPSY Left 09/02/2015   benign  . BREAST CYST ASPIRATION Left    neg  . CARDIAC CATHETERIZATION     2 STENTS 2013 IN [place]  . cardiac stents    . ESOPHAGOGASTRODUODENOSCOPY (EGD) WITH PROPOFOL N/A 02/21/2015   Procedure: ESOPHAGOGASTRODUODENOSCOPY (EGD) WITH PROPOFOL;  Surgeon: Elnita Maxwell, MD;  Location: [facility] ENDOSCOPY;  Service: Endoscopy;  Laterality: N/A;  . EYE SURGERY      Her family history includes COPD in her brother and father; Liver cancer in her mother and son; Lymphoma in her father. There is no history of Breast cancer or Colon cancer.   Current Outpatient Medications:  .  acetaminophen (TYLENOL) 500 MG tablet, Take 500 mg by mouth every 6 (six) hours as needed. , Disp: , Rfl:  .  aspirin EC 81 MG tablet, Take 81 mg by mouth daily. , Disp: , Rfl:  .  cetirizine (ZYRTEC) 10 MG tablet, Take 10 mg by mouth daily., Disp: , Rfl:  .  lactulose (CHRONULAC) 10 GM/15ML solution, Take 20 g by mouth 2 (two) times daily., Disp: , Rfl:  .  losartan (COZAAR) 25 MG tablet, Take 25 mg by mouth daily., Disp: , Rfl:  .   lovastatin (MEVACOR) 40 MG tablet, Take 40 mg by mouth every evening. TAKE ONE TABLET BY MOUTH ONCE DAILY WITH DINNER, Disp: , Rfl:  .  nadolol (CORGARD) 20 MG tablet, Take 10 mg by mouth daily. , Disp: , Rfl:  .  nitroGLYCERIN (NITROSTAT) 0.4 MG SL tablet, Place 0.4 mg under the tongue every 5 (five) minutes as needed for chest pain. , Disp: , Rfl:  .  omeprazole (PRILOSEC) 20 MG capsule, Take 20 mg by mouth daily. , Disp: , Rfl:  .  rifaximin (XIFAXAN) 200 MG tablet, Take 400 mg by mouth daily., Disp: , Rfl:  .  spironolactone (ALDACTONE) 25 MG tablet, Take 25 mg by mouth 2 (two) times daily. , Disp: , Rfl:  .  triamcinolone cream (KENALOG) 0.1 %, APPLY ON THE SKIN TWICE A DAY AS NEEDED ITCHING. AVOID  FACE, GROIN, UNDERARMS, Disp: , Rfl: 2 .  Zinc Sulfate (ZINC 15 PO), Take by mouth daily. Unsure of dose, Disp: , Rfl:  .  montelukast (SINGULAIR) 10 MG tablet, Take 1 tablet (10 mg total) by mouth at bedtime., Disp: 30 tablet, Rfl: 3  Patient Care Team: Paiton Fosco, Marzella Schlein, MD as PCP - General (Family Medicine) Domingo Madeira, OD as Consulting Physician (Optometry) Lamar Blinks, MD as Consulting Physician (Cardiology) Woodfin Ganja, MD as Referring Physician (Internal Medicine)     Objective:    Vitals: BP 126/62 (BP Location: Right Arm, Patient Position: Sitting, Cuff Size: Normal)   Pulse 74   Temp 98 F (36.7 C) (Oral)   Ht 5\' 2"  (1.575 m)   Wt 158 lb 12.8 oz (72 kg)   SpO2 99%   BMI 29.04 kg/m   Physical Exam Vitals signs reviewed.  Constitutional:      General: She is not in acute distress.    Appearance: Normal appearance. She is well-developed. She is not diaphoretic.  HENT:     Head: Normocephalic and atraumatic.     Right Ear: External ear normal.     Left Ear: External ear normal.  Eyes:     General: No scleral icterus.    Extraocular Movements: Extraocular movements intact.     Conjunctiva/sclera: Conjunctivae normal.     Pupils: Pupils are equal, round, and  reactive to light.  Neck:     Musculoskeletal: Neck supple.     Thyroid: No thyromegaly.  Cardiovascular:     Rate and Rhythm: Normal rate and regular rhythm.     Pulses: Normal pulses.     Heart sounds: Normal heart sounds. No murmur.  Pulmonary:     Effort: Pulmonary effort is normal. No respiratory distress.     Breath sounds: Normal breath sounds. No wheezing or rales.  Abdominal:     General: Bowel sounds are normal. There is no distension.     Palpations: Abdomen is soft.     Tenderness: There is no abdominal tenderness. There is no guarding or rebound.  Musculoskeletal:        General: No deformity.     Right lower leg: Edema present.     Left lower leg: Edema present.  Lymphadenopathy:     Cervical: No cervical adenopathy.  Skin:    General: Skin is warm and dry.     Capillary Refill: Capillary refill takes less than 2 seconds.     Findings: No rash.  Neurological:     Mental Status: She is alert and oriented to person, place, and time.  Psychiatric:        Mood and Affect: Mood normal.        Behavior: Behavior normal.        Thought Content: Thought content normal.     Activities of Daily Living In your present state of health, do you have any difficulty performing the following activities: 08/07/2018  Hearing? Y  Comment Wears bilateral hearing aids.   Vision? N  Comment Wears eye glasses daily.   Difficulty concentrating or making decisions? Y  Walking or climbing stairs? Y  Comment Due to pain in legs.   Dressing or bathing? N  Doing errands, shopping? N  Preparing Food and eating ? N  Using the Toilet? N  In the past six months, have you accidently leaked urine? N  Do you have problems with loss of bowel control? N  Managing your Medications? N  Managing  your Finances? N  Housekeeping or managing your Housekeeping? N  Some recent data might be hidden    Fall Risk Assessment Fall Risk  08/07/2018 08/02/2017 07/11/2017  Falls in the past year? 1 No No   Number falls in past yr: 0 - -  Injury with Fall? 0 - -  Follow up Falls prevention discussed - -     Depression Screen PHQ 2/9 Scores 08/07/2018 08/07/2018 08/02/2017 07/11/2017  PHQ - 2 Score 0 0 0 1  PHQ- 9 Score 0 - 3 8    No flowsheet data found.     Assessment & Plan:    Annual Physical Reviewed patient's Family Medical History Reviewed and updated list of patient's medical providers Assessment of cognitive impairment was done Assessed patient's functional ability Established a written schedule for health screening services Health Risk Assessent Completed and Reviewed  Exercise Activities and Dietary recommendations Goals    . DIET - REDUCE SODIUM INTAKE     Recommend to continue to cut back on sodium intake to less than 8 grams a day.     . Prevent falls     Recommend to remove any items from the home that may cause slips or trips.       Immunization History  Administered Date(s) Administered  . Hepatitis A 04/22/2014, 05/20/2014  . Hepatitis B 04/22/2014, 05/20/2014, 10/21/2014  . Influenza Split 12/16/2014  . Influenza, High Dose Seasonal PF 11/29/2015, 12/06/2016, 01/11/2018  . Influenza-Unspecified 01/20/2013  . Pneumococcal Conjugate-13 04/04/2015  . Pneumococcal Polysaccharide-23 06/10/2013  . Tdap 05/11/2014    Health Maintenance  Topic Date Due  . INFLUENZA VACCINE  10/04/2018  . MAMMOGRAM  11/07/2018  . DEXA SCAN  09/25/2022  . COLONOSCOPY  07/11/2023  . TETANUS/TDAP  05/10/2024  . PNA vac Low Risk Adult  Completed     Discussed health benefits of physical activity, and encouraged her to engage in regular exercise appropriate for her age and condition.    ------------------------------------------------------------------------------------------------------------  Problem List Items Addressed This Visit      Cardiovascular and Mediastinum   Arteriosclerosis of coronary artery    Status post MI and 2 stent placement of LAD in 2013 [facility]ed  [facility] On beta-blocker, ARB, aspirin, and statin Asymptomatic      Essential (primary) hypertension    Well-controlled Continue current medications Recheck metabolic panel      Bilateral carotid artery stenosis    Followed by cardiology Repeat carotid artery ultrasound yesterday, but results are not able to be viewed at this time Asymptomatic        Respiratory   Seasonal allergic rhinitis due to pollen    Chronic and stable Continue antihistamine daily Unable to tolerate nasal sprays Suspect that her chronic intermittent dry cough may be related to allergy symptoms Lungs are clear We will add Singulair 10 mg daily        Digestive   Cirrhosis of liver not due to alcohol (HCC)    Followed by [facility] hepatology She is taking her medications as prescribed and states that things are doing well Her hepatic encephalopathy is doing better since adding Xifaxan She has had thrombocytopenia in the past, so we will recheck a CBC today as well as CMP      GERD (gastroesophageal reflux disease)    Chronic and well-controlled Continue daily PPI Followed by GI Ankylosed dose for the shortest amount of time with PPI therapy        Other  Anemia, iron deficiency    Chronic and stable Recheck CBC today Not currently on iron supplement      Relevant Orders   CBC with Differential/Platelet   Combined fat and carbohydrate induced hyperlipemia    Previously well controlled Tolerating lovastatin well Recheck fasting lipid panel and CMP      Relevant Orders   Comprehensive metabolic panel   Lipid panel   H/O acute myocardial infarction    Followed by cardiology Asymptomatic Continue current medications      Hyperglycemia    Noted on previous lab work Check A1c today      Relevant Orders   Hemoglobin A1c   Thrombocytopenia (HCC)    Related to hepatic cirrhosis Stable on last visit Recheck CBC today Followed [facility] hepatology      Relevant  Orders   CBC with Differential/Platelet    Other Visit Diagnoses    Encounter for annual physical exam    -  Primary   Screening for breast cancer       Relevant Orders   MM 3D SCREEN BREAST BILATERAL       Return in about 6 months (around 02/06/2019) for chronic disease f/u.   The entirety of the information documented in the History of Present Illness, Review of Systems and Physical Exam were personally obtained by me. Portions of this information were initially documented by [facility], CMA and reviewed by me for thoroughness and accuracy.    Jayma Volpi, Marzella Schlein, MD MPH [facility]

## 2018-08-07 NOTE — Assessment & Plan Note (Signed)
Previously well controlled Tolerating lovastatin well Recheck fasting lipid panel and CMP

## 2018-08-07 NOTE — Assessment & Plan Note (Signed)
Chronic and stable Recheck CBC today Not currently on iron supplement

## 2018-08-08 ENCOUNTER — Telehealth: Payer: Self-pay

## 2018-08-08 DIAGNOSIS — I2510 Atherosclerotic heart disease of native coronary artery without angina pectoris: Secondary | ICD-10-CM | POA: Diagnosis not present

## 2018-08-08 DIAGNOSIS — I10 Essential (primary) hypertension: Secondary | ICD-10-CM | POA: Diagnosis not present

## 2018-08-08 DIAGNOSIS — I6523 Occlusion and stenosis of bilateral carotid arteries: Secondary | ICD-10-CM | POA: Diagnosis not present

## 2018-08-08 DIAGNOSIS — I214 Non-ST elevation (NSTEMI) myocardial infarction: Secondary | ICD-10-CM | POA: Diagnosis not present

## 2018-08-08 LAB — CBC WITH DIFFERENTIAL/PLATELET
Basophils Absolute: 0 10*3/uL (ref 0.0–0.2)
Basos: 1 %
EOS (ABSOLUTE): 0.4 10*3/uL (ref 0.0–0.4)
Eos: 7 %
Hematocrit: 30.9 % — ABNORMAL LOW (ref 34.0–46.6)
Hemoglobin: 10.6 g/dL — ABNORMAL LOW (ref 11.1–15.9)
Immature Grans (Abs): 0 10*3/uL (ref 0.0–0.1)
Immature Granulocytes: 0 %
Lymphocytes Absolute: 2.1 10*3/uL (ref 0.7–3.1)
Lymphs: 33 %
MCH: 34.9 pg — ABNORMAL HIGH (ref 26.6–33.0)
MCHC: 34.3 g/dL (ref 31.5–35.7)
MCV: 102 fL — ABNORMAL HIGH (ref 79–97)
Monocytes Absolute: 0.8 10*3/uL (ref 0.1–0.9)
Monocytes: 13 %
Neutrophils Absolute: 2.9 10*3/uL (ref 1.4–7.0)
Neutrophils: 46 %
Platelets: 75 10*3/uL — CL (ref 150–450)
RBC: 3.04 x10E6/uL — ABNORMAL LOW (ref 3.77–5.28)
RDW: 13.8 % (ref 11.7–15.4)
WBC: 6.4 10*3/uL (ref 3.4–10.8)

## 2018-08-08 LAB — COMPREHENSIVE METABOLIC PANEL
ALT: 24 IU/L (ref 0–32)
AST: 49 IU/L — ABNORMAL HIGH (ref 0–40)
Albumin/Globulin Ratio: 1.3 (ref 1.2–2.2)
Albumin: 3.4 g/dL — ABNORMAL LOW (ref 3.7–4.7)
Alkaline Phosphatase: 229 IU/L — ABNORMAL HIGH (ref 39–117)
BUN/Creatinine Ratio: 7 — ABNORMAL LOW (ref 12–28)
BUN: 8 mg/dL (ref 8–27)
Bilirubin Total: 2.4 mg/dL — ABNORMAL HIGH (ref 0.0–1.2)
CO2: 20 mmol/L (ref 20–29)
Calcium: 9.1 mg/dL (ref 8.7–10.3)
Chloride: 102 mmol/L (ref 96–106)
Creatinine, Ser: 1.12 mg/dL — ABNORMAL HIGH (ref 0.57–1.00)
GFR calc Af Amer: 56 mL/min/{1.73_m2} — ABNORMAL LOW (ref >59)
GFR calc non Af Amer: 49 mL/min/{1.73_m2} — ABNORMAL LOW (ref >59)
Globulin, Total: 2.7 g/dL (ref 1.5–4.5)
Glucose: 113 mg/dL — ABNORMAL HIGH (ref 65–99)
Potassium: 4.6 mmol/L (ref 3.5–5.2)
Sodium: 132 mmol/L — ABNORMAL LOW (ref 134–144)
Total Protein: 6.1 g/dL (ref 6.0–8.5)

## 2018-08-08 LAB — LIPID PANEL
Chol/HDL Ratio: 2 ratio (ref 0.0–4.4)
Cholesterol, Total: 156 mg/dL (ref 100–199)
HDL: 77 mg/dL (ref >39)
LDL Calculated: 61 mg/dL (ref 0–99)
Triglycerides: 91 mg/dL (ref 0–149)
VLDL Cholesterol Cal: 18 mg/dL (ref 5–40)

## 2018-08-08 LAB — HEMOGLOBIN A1C
Est. average glucose Bld gHb Est-mCnc: 114 mg/dL
Hgb A1c MFr Bld: 5.6 % (ref 4.8–5.6)

## 2018-08-08 NOTE — Telephone Encounter (Signed)
-----   Message from Virginia Crews, MD sent at 08/08/2018 11:57 AM EDT ----- Blood counts and liver numbers are worse.  This may all be related to the cirrhosis.  I'd recommend letting your hepatologist know and see if they would recommend you also see a hematologist  Cholesterol and A1c are normal

## 2018-08-08 NOTE — Telephone Encounter (Signed)
[facility] 

## 2018-08-11 NOTE — Telephone Encounter (Signed)
Pt returned call ° °teri °

## 2018-08-11 NOTE — Telephone Encounter (Signed)
Patient advised. Lab results printed at the front desk for pick up.

## 2018-08-13 MED ORDER — NADOLOL 20 MG TABLET
ORAL_TABLET | Freq: Every day | ORAL | 11 refills | 0 days | Status: CP
Start: 2018-08-13 — End: 2019-08-13

## 2018-09-12 MED ORDER — SPIRONOLACTONE 25 MG TABLET
ORAL_TABLET | Freq: Two times a day (BID) | ORAL | 3 refills | 0.00000 days | Status: CP
Start: 2018-09-12 — End: 2019-09-12

## 2018-10-24 ENCOUNTER — Telehealth: Payer: Self-pay

## 2018-10-24 DIAGNOSIS — D696 Thrombocytopenia, unspecified: Secondary | ICD-10-CM

## 2018-10-24 DIAGNOSIS — K7460 Unspecified cirrhosis of liver: Secondary | ICD-10-CM

## 2018-10-24 NOTE — Telephone Encounter (Signed)
Called patient and left message for her to call back due to her need labs done.

## 2018-10-27 NOTE — Telephone Encounter (Signed)
Pt returned missed call.  Please call pt back. ° °Thanks, °TGH °

## 2018-10-27 NOTE — Telephone Encounter (Signed)
Patient was advised and states that she will come in in September for labs.

## 2018-10-29 ENCOUNTER — Other Ambulatory Visit: Payer: Self-pay | Admitting: Family Medicine

## 2018-11-04 DIAGNOSIS — K7460 Unspecified cirrhosis of liver: Secondary | ICD-10-CM | POA: Diagnosis not present

## 2018-11-04 DIAGNOSIS — D696 Thrombocytopenia, unspecified: Secondary | ICD-10-CM | POA: Diagnosis not present

## 2018-11-05 ENCOUNTER — Telehealth: Payer: Self-pay

## 2018-11-05 LAB — COMPREHENSIVE METABOLIC PANEL
ALT: 19 IU/L (ref 0–32)
AST: 45 IU/L — ABNORMAL HIGH (ref 0–40)
Albumin/Globulin Ratio: 1.2 (ref 1.2–2.2)
Albumin: 3.3 g/dL — ABNORMAL LOW (ref 3.7–4.7)
Alkaline Phosphatase: 214 IU/L — ABNORMAL HIGH (ref 39–117)
BUN/Creatinine Ratio: 9 — ABNORMAL LOW (ref 12–28)
BUN: 11 mg/dL (ref 8–27)
Bilirubin Total: 3.1 mg/dL — ABNORMAL HIGH (ref 0.0–1.2)
CO2: 20 mmol/L (ref 20–29)
Calcium: 9.6 mg/dL (ref 8.7–10.3)
Chloride: 103 mmol/L (ref 96–106)
Creatinine, Ser: 1.17 mg/dL — ABNORMAL HIGH (ref 0.57–1.00)
GFR calc Af Amer: 53 mL/min/{1.73_m2} — ABNORMAL LOW (ref >59)
GFR calc non Af Amer: 46 mL/min/{1.73_m2} — ABNORMAL LOW (ref >59)
Globulin, Total: 2.8 g/dL (ref 1.5–4.5)
Glucose: 76 mg/dL (ref 65–99)
Potassium: 4.3 mmol/L (ref 3.5–5.2)
Sodium: 136 mmol/L (ref 134–144)
Total Protein: 6.1 g/dL (ref 6.0–8.5)

## 2018-11-05 LAB — CBC WITH DIFFERENTIAL/PLATELET
Basophils Absolute: 0.1 10*3/uL (ref 0.0–0.2)
Basos: 1 %
EOS (ABSOLUTE): 0.6 10*3/uL — ABNORMAL HIGH (ref 0.0–0.4)
Eos: 9 %
Hematocrit: 33.8 % — ABNORMAL LOW (ref 34.0–46.6)
Hemoglobin: 11.8 g/dL (ref 11.1–15.9)
Immature Grans (Abs): 0 10*3/uL (ref 0.0–0.1)
Immature Granulocytes: 0 %
Lymphocytes Absolute: 2.5 10*3/uL (ref 0.7–3.1)
Lymphs: 35 %
MCH: 34.8 pg — ABNORMAL HIGH (ref 26.6–33.0)
MCHC: 34.9 g/dL (ref 31.5–35.7)
MCV: 100 fL — ABNORMAL HIGH (ref 79–97)
Monocytes Absolute: 0.7 10*3/uL (ref 0.1–0.9)
Monocytes: 10 %
Neutrophils Absolute: 3.1 10*3/uL (ref 1.4–7.0)
Neutrophils: 45 %
Platelets: 85 10*3/uL — CL (ref 150–450)
RBC: 3.39 x10E6/uL — ABNORMAL LOW (ref 3.77–5.28)
RDW: 13 % (ref 11.7–15.4)
WBC: 7 10*3/uL (ref 3.4–10.8)

## 2018-11-05 LAB — PROTIME-INR
INR: 1.2 (ref 0.8–1.2)
Prothrombin Time: 13.1 s — ABNORMAL HIGH (ref 9.1–12.0)

## 2018-11-05 NOTE — Telephone Encounter (Signed)
-----   Message from Virginia Crews, MD sent at 11/05/2018  8:51 AM EDT ----- Overall stable labs.  These need to be faxed to her GI (Dr Drue Novel) who requested them (there was a fax from her office).

## 2018-11-05 NOTE — Telephone Encounter (Signed)
Patient was advised and labs faxed to GI doctor.

## 2018-11-25 DIAGNOSIS — H5213 Myopia, bilateral: Secondary | ICD-10-CM | POA: Diagnosis not present

## 2018-11-25 DIAGNOSIS — Z961 Presence of intraocular lens: Secondary | ICD-10-CM | POA: Diagnosis not present

## 2018-11-25 DIAGNOSIS — H52223 Regular astigmatism, bilateral: Secondary | ICD-10-CM | POA: Diagnosis not present

## 2018-11-25 DIAGNOSIS — H40022 Open angle with borderline findings, high risk, left eye: Secondary | ICD-10-CM | POA: Diagnosis not present

## 2018-11-25 DIAGNOSIS — H43813 Vitreous degeneration, bilateral: Secondary | ICD-10-CM | POA: Diagnosis not present

## 2018-11-28 ENCOUNTER — Ambulatory Visit
Admission: RE | Admit: 2018-11-28 | Discharge: 2018-11-28 | Disposition: A | Discharge status: Home / Self Care | Payer: Medicare Other | Source: Ambulatory Visit | Attending: Family Medicine | Admitting: Family Medicine

## 2018-11-28 DIAGNOSIS — Z1239 Encounter for other screening for malignant neoplasm of breast: Secondary | ICD-10-CM | POA: Diagnosis present

## 2018-11-28 DIAGNOSIS — Z1231 Encounter for screening mammogram for malignant neoplasm of breast: Secondary | ICD-10-CM | POA: Diagnosis not present

## 2018-12-10 ENCOUNTER — Encounter: Admit: 2018-12-10 | Discharge: 2018-12-11 | Payer: MEDICARE

## 2018-12-10 DIAGNOSIS — K829 Disease of gallbladder, unspecified: Secondary | ICD-10-CM | POA: Diagnosis not present

## 2018-12-10 DIAGNOSIS — I700 Atherosclerosis of aorta: Secondary | ICD-10-CM | POA: Diagnosis not present

## 2018-12-10 DIAGNOSIS — K7460 Unspecified cirrhosis of liver: Secondary | ICD-10-CM | POA: Diagnosis not present

## 2018-12-10 DIAGNOSIS — Z1289 Encounter for screening for malignant neoplasm of other sites: Secondary | ICD-10-CM

## 2018-12-11 DIAGNOSIS — G8929 Other chronic pain: Secondary | ICD-10-CM | POA: Diagnosis not present

## 2018-12-11 DIAGNOSIS — M7541 Impingement syndrome of right shoulder: Secondary | ICD-10-CM | POA: Diagnosis not present

## 2018-12-11 DIAGNOSIS — M47816 Spondylosis without myelopathy or radiculopathy, lumbar region: Secondary | ICD-10-CM | POA: Diagnosis not present

## 2018-12-11 DIAGNOSIS — M545 Low back pain: Secondary | ICD-10-CM | POA: Diagnosis not present

## 2018-12-11 DIAGNOSIS — M25511 Pain in right shoulder: Secondary | ICD-10-CM | POA: Diagnosis not present

## 2018-12-24 DIAGNOSIS — M62830 Muscle spasm of back: Secondary | ICD-10-CM | POA: Diagnosis not present

## 2019-01-14 ENCOUNTER — Other Ambulatory Visit: Payer: Self-pay | Admitting: Physical Medicine and Rehabilitation

## 2019-01-14 ENCOUNTER — Other Ambulatory Visit: Payer: Self-pay

## 2019-01-14 ENCOUNTER — Other Ambulatory Visit (HOSPITAL_COMMUNITY): Payer: Self-pay | Admitting: Physical Medicine and Rehabilitation

## 2019-01-14 ENCOUNTER — Ambulatory Visit
Admission: RE | Admit: 2019-01-14 | Discharge: 2019-01-14 | Disposition: A | Discharge status: Home / Self Care | Payer: Medicare Other | Source: Ambulatory Visit | Attending: Physical Medicine and Rehabilitation | Admitting: Physical Medicine and Rehabilitation

## 2019-01-14 DIAGNOSIS — S0990XA Unspecified injury of head, initial encounter: Secondary | ICD-10-CM

## 2019-01-14 DIAGNOSIS — R2689 Other abnormalities of gait and mobility: Secondary | ICD-10-CM | POA: Diagnosis not present

## 2019-01-14 DIAGNOSIS — M62830 Muscle spasm of back: Secondary | ICD-10-CM | POA: Diagnosis not present

## 2019-01-14 DIAGNOSIS — R42 Dizziness and giddiness: Secondary | ICD-10-CM | POA: Diagnosis not present

## 2019-01-16 ENCOUNTER — Other Ambulatory Visit: Payer: Self-pay

## 2019-01-16 ENCOUNTER — Encounter: Payer: Self-pay | Admitting: Family Medicine

## 2019-01-16 ENCOUNTER — Ambulatory Visit (INDEPENDENT_AMBULATORY_CARE_PROVIDER_SITE_OTHER): Payer: Medicare Other | Admitting: Family Medicine

## 2019-01-16 VITALS — BP 135/75 | HR 62 | Temp 96.0°F | Wt 156.0 lb

## 2019-01-16 DIAGNOSIS — Z23 Encounter for immunization: Secondary | ICD-10-CM

## 2019-01-16 DIAGNOSIS — K7290 Hepatic failure, unspecified without coma: Secondary | ICD-10-CM

## 2019-01-16 DIAGNOSIS — R270 Ataxia, unspecified: Secondary | ICD-10-CM

## 2019-01-16 DIAGNOSIS — K7682 Hepatic encephalopathy: Secondary | ICD-10-CM

## 2019-01-16 NOTE — Progress Notes (Signed)
Patient: Shannon Sawyer Female    DOB: 06-19-1945   73 y.o.   MRN: [id number] Visit Date: 01/16/2019  Today's Provider: Shirlee Latch, MD   Chief Complaint  Patient presents with   Fall   Subjective:     Fall The accident occurred more than 1 week ago. There was no blood loss. The point of impact was the head and right hip. Pertinent negatives include no abdominal pain, bowel incontinence, fever, headaches, hearing loss, hematuria, loss of consciousness, nausea, numbness, tingling, visual change or vomiting.   Patient was squatting down to wipe off some water from the floor and slipped and fell onto R hip. Back of head hit the dishwasher behind her.  This occurred 2-3 weeks ago.  She was hurting, but functioning ok at that time.  Over the last week, husband has noticed slow and shuffling gait and leaning toward the L side while walking.  Over the last several months, she has seemed to be functioning slower and being more unsteady and confused.    She is followed by [facility] for cirrhosis.  She is not taking her Lactulose at all.  Allergies  Allergen Reactions   Contrast Media  [Iodinated Diagnostic Agents] Rash    Blister rash    Azelastine Swelling   Flonase [Fluticasone Propionate] Swelling   Prednisone Swelling   Shellfish Allergy Swelling and Other (See Comments)    Per spouse, determined on allergy test.   Tramadol Nausea Only     Current Outpatient Medications:    acetaminophen (TYLENOL) 500 MG tablet, Take 500 mg by mouth every 6 (six) hours as needed. , Disp: , Rfl:    aspirin EC 81 MG tablet, Take 81 mg by mouth daily. , Disp: , Rfl:    losartan (COZAAR) 25 MG tablet, Take 25 mg by mouth daily., Disp: , Rfl:    lovastatin (MEVACOR) 40 MG tablet, Take 40 mg by mouth every evening. TAKE ONE TABLET BY MOUTH ONCE DAILY WITH DINNER, Disp: , Rfl:    nitroGLYCERIN (NITROSTAT) 0.4 MG SL tablet, Place 0.4 mg under the tongue every 5 (five) minutes as needed  for chest pain. , Disp: , Rfl:    omeprazole (PRILOSEC) 20 MG capsule, Take 20 mg by mouth daily. , Disp: , Rfl:    rifaximin (XIFAXAN) 200 MG tablet, Take 400 mg by mouth daily., Disp: , Rfl:    triamcinolone cream (KENALOG) 0.1 %, APPLY ON THE SKIN TWICE A DAY AS NEEDED ITCHING. AVOID FACE, GROIN, UNDERARMS, Disp: , Rfl: 2   Zinc Sulfate (ZINC 15 PO), Take by mouth daily. Unsure of dose, Disp: , Rfl:    cetirizine (ZYRTEC) 10 MG tablet, Take 10 mg by mouth daily., Disp: , Rfl:    lactulose (CHRONULAC) 10 GM/15ML solution, Take 20 g by mouth 2 (two) times daily., Disp: , Rfl:    montelukast (SINGULAIR) 10 MG tablet, TAKE 1 TABLET BY MOUTH EVERYDAY AT BEDTIME (Patient not taking: Reported on 01/16/2019), Disp: 90 tablet, Rfl: 1   nadolol (CORGARD) 20 MG tablet, Take 10 mg by mouth daily. , Disp: , Rfl:    spironolactone (ALDACTONE) 25 MG tablet, Take 25 mg by mouth 2 (two) times daily. , Disp: , Rfl:   Review of Systems  Constitutional: Positive for appetite change. Negative for activity change, chills, diaphoresis, fatigue, fever and unexpected weight change.  Respiratory: Negative.   Cardiovascular: Negative.   Gastrointestinal: Negative.  Negative for abdominal pain, bowel incontinence,  nausea and vomiting.  Endocrine: Negative.   Genitourinary: Negative for hematuria.  Musculoskeletal: Positive for back pain and gait problem. Negative for arthralgias, joint swelling, myalgias, neck pain and neck stiffness.  Neurological: Positive for light-headedness. Negative for dizziness, tingling, loss of consciousness, numbness and headaches.    Social History   Tobacco Use   Smoking status: Former Smoker    Types: Cigarettes   Smokeless tobacco: Never Used   Tobacco comment: formerly tried smoking cigarettes, no regular use  Substance Use Topics   Alcohol use: No      Objective:   BP 135/75 (BP Location: Left Arm, Patient Position: Sitting, Cuff Size: Normal)    Pulse 62     Temp (!) 96 F (35.6 C) (Temporal)    Wt 156 lb (70.8 kg)    BMI 28.53 kg/m  Vitals:   01/16/19 0908  BP: 135/75  Pulse: 62  Temp: (!) 96 F (35.6 C)  TempSrc: Temporal  Weight: 156 lb (70.8 kg)  Body mass index is 28.53 kg/m.   Orthostatic VS for the past 72 hrs (Last 3 readings):  Orthostatic BP Patient Position BP Location Cuff Size Orthostatic Pulse  01/16/19 1151 132/84 Standing Left Arm Normal 68  01/16/19 1150 131/83 Sitting Left Arm Normal 61  01/16/19 1149 120/84 Supine Left Arm Normal 60     Physical Exam Constitutional:      General: She is not in acute distress.    Appearance: Normal appearance. She is not diaphoretic.  HENT:     Head: Normocephalic and atraumatic.     Right Ear: External ear normal.     Left Ear: External ear normal.  Eyes:     General: No scleral icterus.    Conjunctiva/sclera: Conjunctivae normal.  Neck:     Musculoskeletal: Neck supple.  Cardiovascular:     Rate and Rhythm: Normal rate and regular rhythm.     Pulses: Normal pulses.     Heart sounds: Normal heart sounds. No murmur.  Pulmonary:     Effort: Pulmonary effort is normal. No respiratory distress.     Breath sounds: Normal breath sounds. No wheezing or rhonchi.  Abdominal:     General: There is no distension.     Palpations: Abdomen is soft.     Tenderness: There is no abdominal tenderness.  Musculoskeletal:     Right lower leg: No edema.     Left lower leg: No edema.  Lymphadenopathy:     Cervical: No cervical adenopathy.  Skin:    General: Skin is warm and dry.     Findings: No rash.  Neurological:     Mental Status: She is alert and oriented to person, place, and time.     Comments: Takes much more time to process simple commands (for example it takes 2-3 minutes for her to stand from a chair and figure out how to sit up on the exam table with help from her husband).  Gait is slow and she does lean to the left.  Strength and gross sensation to light touch is intact  in upper and lower extremities.  Finger-nose-finger is intact, but she is slow to process the command to do this.  Her speech seems intact and is not slurred.  She does have left-sided pronator drift. + [employer]      No results found for any visits on 01/16/19.     Assessment & Plan   1. Ataxia - new problem x1-2 weeks (not at baseline) - started  after a low impact fall, but doubt this is what caused this - also with L pronator drift - recent Head CT with no acute findings - will obtain MRI Brain and refer to Neurology for further eval - Some of this may be related to hepatic encephalopathy as below as well - MR Brain Wo Contrast; Future - Ambulatory referral to Neurology  2. Hepatic encephalopathy (HCC) - patient with known cirrhosis and not taking lactulose -Last ammonia level in her chart is from several years ago -Concerned by her slow processing time and cognitive deficit today which is not her baseline -Recent head CT within normal limits -Advised her that she needs to be taking her lactulose and needs to resume it today -Check CMP, CBC, ammonia levels -MRI brain as above -Discussed strict return precautions - CBC w/Diff/Platelet - CMP (Comprehensive metabolic panel) - Ammonia  3. Need for influenza vaccination - Flu Vaccine QUAD High Dose(Fluad)    Return in about 2 weeks (around 01/30/2019).   The entirety of the information documented in the History of Present Illness, Review of Systems and Physical Exam were personally obtained by me. Portions of this information were initially documented by Kavin Leech, CMA and reviewed by me for thoroughness and accuracy.    Khloey Chern, Marzella Schlein, MD MPH [facility]

## 2019-01-17 LAB — CBC WITH DIFFERENTIAL/PLATELET
Basophils Absolute: 0 10*3/uL (ref 0.0–0.2)
Basos: 0 %
EOS (ABSOLUTE): 0.3 10*3/uL (ref 0.0–0.4)
Eos: 3 %
Hematocrit: 35.1 % (ref 34.0–46.6)
Hemoglobin: 12.2 g/dL (ref 11.1–15.9)
Immature Grans (Abs): 0 10*3/uL (ref 0.0–0.1)
Immature Granulocytes: 0 %
Lymphocytes Absolute: 1.8 10*3/uL (ref 0.7–3.1)
Lymphs: 24 %
MCH: 34.8 pg — ABNORMAL HIGH (ref 26.6–33.0)
MCHC: 34.8 g/dL (ref 31.5–35.7)
MCV: 100 fL — ABNORMAL HIGH (ref 79–97)
Monocytes Absolute: 0.7 10*3/uL (ref 0.1–0.9)
Monocytes: 9 %
Neutrophils Absolute: 4.9 10*3/uL (ref 1.4–7.0)
Neutrophils: 64 %
Platelets: 89 10*3/uL — CL (ref 150–450)
RBC: 3.51 x10E6/uL — ABNORMAL LOW (ref 3.77–5.28)
RDW: 13.1 % (ref 11.7–15.4)
WBC: 7.7 10*3/uL (ref 3.4–10.8)

## 2019-01-17 LAB — COMPREHENSIVE METABOLIC PANEL
ALT: 35 IU/L — ABNORMAL HIGH (ref 0–32)
AST: 58 IU/L — ABNORMAL HIGH (ref 0–40)
Albumin/Globulin Ratio: 1.2 (ref 1.2–2.2)
Albumin: 3.4 g/dL — ABNORMAL LOW (ref 3.7–4.7)
Alkaline Phosphatase: 230 IU/L — ABNORMAL HIGH (ref 39–117)
BUN/Creatinine Ratio: 11 — ABNORMAL LOW (ref 12–28)
BUN: 12 mg/dL (ref 8–27)
Bilirubin Total: 3.4 mg/dL — ABNORMAL HIGH (ref 0.0–1.2)
CO2: 18 mmol/L — ABNORMAL LOW (ref 20–29)
Calcium: 9.4 mg/dL (ref 8.7–10.3)
Chloride: 91 mmol/L — ABNORMAL LOW (ref 96–106)
Creatinine, Ser: 1.09 mg/dL — ABNORMAL HIGH (ref 0.57–1.00)
GFR calc Af Amer: 58 mL/min/{1.73_m2} — ABNORMAL LOW (ref >59)
GFR calc non Af Amer: 50 mL/min/{1.73_m2} — ABNORMAL LOW (ref >59)
Globulin, Total: 2.8 g/dL (ref 1.5–4.5)
Glucose: 92 mg/dL (ref 65–99)
Potassium: 4.2 mmol/L (ref 3.5–5.2)
Sodium: 123 mmol/L — ABNORMAL LOW (ref 134–144)
Total Protein: 6.2 g/dL (ref 6.0–8.5)

## 2019-01-17 LAB — AMMONIA: Ammonia: 65 ug/dL (ref 31–169)

## 2019-01-19 ENCOUNTER — Telehealth: Payer: Self-pay

## 2019-01-19 NOTE — Telephone Encounter (Signed)
-----   Message from Virginia Crews, MD sent at 01/19/2019  9:13 AM EST ----- Stable/normal blood counts, kidney function, electrolytes.  Worsening liver function slightly.  Ammonia level is higher than it was when checked about 3 years ago

## 2019-01-19 NOTE — Telephone Encounter (Signed)
Viewed by Aedyn B Keleher on 01/19/2019 10:01 AM

## 2019-01-21 ENCOUNTER — Telehealth: Payer: Self-pay

## 2019-01-21 MED ORDER — LORAZEPAM 0.5 MG PO TABS: ORAL_TABLET | ORAL | 0 refills | Status: DC

## 2019-01-21 NOTE — Telephone Encounter (Signed)
Source Subject Topic  Lemieux, Sharone B (Patient) Wynetta Emery, Kalynn B (Patient) General - Other  Summary: Needs something to help relax during MRI  Reason for CRM: Pt states she will need a valium to take when she has to go for her MRI. She uses [id number] [address], [place] (Phone)  [phone] (Fax)     Copied from [place] [phone]. Topic: General - Other >> Jan 21, 2019 12:51 PM Parke Poisson wrote: Reason for CRM: Pt states she will need a valium to take when she has to go for her MRI. She uses [id number] [address], [place] (Phone) [phone] (Fax)

## 2019-01-21 NOTE — Telephone Encounter (Signed)
Prefer Lorazepam to Valium in the setting of liver disease.  Rx sent for pre-procedure dosing.

## 2019-01-21 NOTE — Telephone Encounter (Signed)
LMTCB 01/21/2019  Thanks,   -Mickel Baas

## 2019-01-28 DIAGNOSIS — K7460 Unspecified cirrhosis of liver: Principal | ICD-10-CM

## 2019-02-02 ENCOUNTER — Other Ambulatory Visit: Payer: Self-pay

## 2019-02-02 ENCOUNTER — Encounter: Payer: Self-pay | Admitting: Family Medicine

## 2019-02-02 ENCOUNTER — Ambulatory Visit (INDEPENDENT_AMBULATORY_CARE_PROVIDER_SITE_OTHER): Payer: Medicare Other | Admitting: Family Medicine

## 2019-02-02 VITALS — BP 113/72 | HR 68 | Temp 96.9°F | Resp 16 | Wt 155.0 lb

## 2019-02-02 DIAGNOSIS — R270 Ataxia, unspecified: Secondary | ICD-10-CM | POA: Diagnosis not present

## 2019-02-02 DIAGNOSIS — K7290 Hepatic failure, unspecified without coma: Secondary | ICD-10-CM | POA: Diagnosis not present

## 2019-02-02 DIAGNOSIS — K7682 Hepatic encephalopathy: Secondary | ICD-10-CM

## 2019-02-02 NOTE — Patient Instructions (Signed)
Referral sent to [facility].  Phone: [phone] Give them a call and schedule an appointment

## 2019-02-02 NOTE — Progress Notes (Signed)
Patient: Shannon Sawyer Female    DOB: October 20, 1945   73 y.o.   MRN: [id number] Visit Date: 02/02/2019  Today's Provider: Lavon Paganini, MD   Chief Complaint  Patient presents with  . Follow-up   Subjective:     HPI Patient here today to follow up from 01/16/2019. Patient reports balance and coordination have improved some but not entirely.  MRI is scheduled for tomorrow.  She did not schedule Neurology appt.  Husband has noticed that she still seems fatigued and slow moving, but her processing cognition has improved noticeably.  She has had looser stools since resuming lactulose.  She does continue to take rifaximin.  She has upcoming appointment with her hepatologist in 2 weeks.   Allergies  Allergen Reactions  . Contrast Media  [Iodinated Diagnostic Agents] Rash    Blister rash   . Azelastine Swelling  . Flonase [Fluticasone Propionate] Swelling  . Prednisone Swelling  . Shellfish Allergy Swelling and Other (See Comments)    Per spouse, determined on allergy test.  . Tramadol Nausea Only     Current Outpatient Medications:  .  acetaminophen (TYLENOL) 500 MG tablet, Take 500 mg by mouth every 6 (six) hours as needed. , Disp: , Rfl:  .  aspirin EC 81 MG tablet, Take 81 mg by mouth daily. , Disp: , Rfl:  .  cetirizine (ZYRTEC) 10 MG tablet, Take 10 mg by mouth daily., Disp: , Rfl:  .  lactulose (CHRONULAC) 10 GM/15ML solution, Take 20 g by mouth 2 (two) times daily., Disp: , Rfl:  .  losartan (COZAAR) 25 MG tablet, Take 25 mg by mouth daily., Disp: , Rfl:  .  lovastatin (MEVACOR) 40 MG tablet, Take 40 mg by mouth every evening. TAKE ONE TABLET BY MOUTH ONCE DAILY WITH DINNER, Disp: , Rfl:  .  nadolol (CORGARD) 20 MG tablet, Take 10 mg by mouth daily. , Disp: , Rfl:  .  nitroGLYCERIN (NITROSTAT) 0.4 MG SL tablet, Place 0.4 mg under the tongue every 5 (five) minutes as needed for chest pain. , Disp: , Rfl:  .  omeprazole (PRILOSEC) 20 MG capsule, Take 20 mg by mouth  daily. , Disp: , Rfl:  .  rifaximin (XIFAXAN) 200 MG tablet, Take 400 mg by mouth daily., Disp: , Rfl:  .  spironolactone (ALDACTONE) 25 MG tablet, Take 25 mg by mouth 2 (two) times daily. , Disp: , Rfl:  .  triamcinolone cream (KENALOG) 0.1 %, APPLY ON THE SKIN TWICE A DAY AS NEEDED ITCHING. AVOID FACE, GROIN, UNDERARMS, Disp: , Rfl: 2 .  Zinc Sulfate (ZINC 15 PO), Take by mouth daily. Unsure of dose, Disp: , Rfl:  .  LORazepam (ATIVAN) 0.5 MG tablet, Take 0.25m PO once 30 minutes prior to procedure. May repeat dose x1 if needed before or during procedure. (Patient not taking: Reported on 02/02/2019), Disp: 2 tablet, Rfl: 0  Review of Systems  Constitutional: Positive for fatigue. Negative for activity change, appetite change, chills, diaphoresis, fever and unexpected weight change.  HENT: Negative.   Respiratory: Negative.   Cardiovascular: Negative.   Neurological: Positive for dizziness and light-headedness. Negative for tremors, seizures, syncope, facial asymmetry, speech difficulty, weakness, numbness and headaches.  Psychiatric/Behavioral: Negative.     Social History   Tobacco Use  . Smoking status: Former Smoker    Types: Cigarettes  . Smokeless tobacco: Never Used  . Tobacco comment: formerly tried smoking cigarettes, no regular use  Substance Use Topics  .  Alcohol use: No      Objective:   BP 113/72 (BP Location: Left Arm, Patient Position: Sitting, Cuff Size: Large)   Pulse 68   Temp (!) 96.9 F (36.1 C) (Temporal)   Resp 16   Wt 155 lb (70.3 kg)   BMI 28.35 kg/m  Vitals:   02/02/19 1040  BP: 113/72  Pulse: 68  Resp: 16  Temp: (!) 96.9 F (36.1 C)  TempSrc: Temporal  Weight: 155 lb (70.3 kg)  Body mass index is 28.35 kg/m.   Physical Exam Vitals signs reviewed.  Constitutional:      General: She is not in acute distress.    Appearance: Normal appearance. She is well-developed. She is not diaphoretic.  HENT:     Head: Normocephalic and atraumatic.   Eyes:     General: No scleral icterus.    Conjunctiva/sclera: Conjunctivae normal.  Neck:     Musculoskeletal: Neck supple.     Thyroid: No thyromegaly.  Cardiovascular:     Rate and Rhythm: Normal rate and regular rhythm.     Pulses: Normal pulses.     Heart sounds: Normal heart sounds. No murmur.  Pulmonary:     Effort: Pulmonary effort is normal. No respiratory distress.     Breath sounds: Normal breath sounds. No wheezing, rhonchi or rales.  Abdominal:     General: There is no distension.     Palpations: Abdomen is soft.     Tenderness: There is no abdominal tenderness. There is no guarding or rebound.  Musculoskeletal:     Right lower leg: No edema.     Left lower leg: No edema.  Lymphadenopathy:     Cervical: No cervical adenopathy.  Skin:    General: Skin is warm and dry.     Capillary Refill: Capillary refill takes less than 2 seconds.     Findings: No rash.  Neurological:     Mental Status: She is alert and oriented to person, place, and time. Mental status is at baseline.     Cranial Nerves: No cranial nerve deficit.     Sensory: No sensory deficit.     Motor: No weakness.     Coordination: Coordination normal.     Gait: Gait abnormal (slow gait).     Comments: Seems to be processing requests and questions more quickly and able to answer questions appropriately Very minimal asterixis. Continues to have L pronator drift  Psychiatric:        Mood and Affect: Mood normal.        Behavior: Behavior normal.      No results found for any visits on 02/02/19.     Assessment & Plan   1. Ataxia - continues to have ataxia and lightheadedness, as well as L pronator drift, with otherwise grossly normal neuro exam - MRI scheduled for tomorrow - advised to call back and schedule appt with Neurology - further f/u pending MRI and Neurology recs  2. Hepatic encephalopathy (HCC) - improving with decreased asterixis and improved processing and cognition - continue  lactulose and rifaximin - has upcoming f/u with Dr Drue Novel   Approximately 25 minutes was spent in discussion of which greater than 50% was consultation.    The entirety of the information documented in the History of Present Illness, Review of Systems and Physical Exam were personally obtained by me. Portions of this information were initially documented by Lynford Humphrey , CMA and reviewed by me for thoroughness and accuracy.    Bacigalupo,  Dionne Bucy, MD MPH [facility]

## 2019-02-03 ENCOUNTER — Ambulatory Visit
Admission: RE | Admit: 2019-02-03 | Discharge: 2019-02-03 | Disposition: A | Discharge status: Home / Self Care | Payer: Medicare Other | Source: Ambulatory Visit | Attending: Family Medicine | Admitting: Family Medicine

## 2019-02-03 DIAGNOSIS — R270 Ataxia, unspecified: Secondary | ICD-10-CM

## 2019-02-03 DIAGNOSIS — R42 Dizziness and giddiness: Secondary | ICD-10-CM | POA: Diagnosis not present

## 2019-02-04 ENCOUNTER — Telehealth: Payer: Self-pay

## 2019-02-04 DIAGNOSIS — K7290 Hepatic failure, unspecified without coma: Secondary | ICD-10-CM

## 2019-02-04 DIAGNOSIS — K7682 Hepatic encephalopathy: Secondary | ICD-10-CM

## 2019-02-04 DIAGNOSIS — R270 Ataxia, unspecified: Secondary | ICD-10-CM

## 2019-02-04 NOTE — Telephone Encounter (Signed)
Patient advised as below. Patient does agree to home health for PT/OT/aid.

## 2019-02-04 NOTE — Telephone Encounter (Signed)
Order placed

## 2019-02-04 NOTE — Telephone Encounter (Signed)
-----   Message from Virginia Crews, MD sent at 02/04/2019  8:24 AM EST ----- Normal MRI Brain with no lesions or acute changes to explain symptoms.  Still recommend keeping appointment with neurology.  Would patient like to start home health PT/OT/aide like we discussed?  I think this would be a good idea.  If she agrees, ok to go ahead and place orders.  Can use diagnosis codes from last visit.

## 2019-02-04 NOTE — Telephone Encounter (Signed)
lmtcb

## 2019-02-06 ENCOUNTER — Telehealth: Payer: Self-pay | Admitting: Family Medicine

## 2019-02-06 DIAGNOSIS — M5010 Cervical disc disorder with radiculopathy, unspecified cervical region: Secondary | ICD-10-CM | POA: Diagnosis not present

## 2019-02-06 DIAGNOSIS — I252 Old myocardial infarction: Secondary | ICD-10-CM | POA: Diagnosis not present

## 2019-02-06 DIAGNOSIS — K7290 Hepatic failure, unspecified without coma: Secondary | ICD-10-CM | POA: Diagnosis not present

## 2019-02-06 DIAGNOSIS — I071 Rheumatic tricuspid insufficiency: Secondary | ICD-10-CM | POA: Diagnosis not present

## 2019-02-06 DIAGNOSIS — R413 Other amnesia: Secondary | ICD-10-CM | POA: Diagnosis not present

## 2019-02-06 DIAGNOSIS — M479 Spondylosis, unspecified: Secondary | ICD-10-CM | POA: Diagnosis not present

## 2019-02-06 DIAGNOSIS — K7460 Unspecified cirrhosis of liver: Secondary | ICD-10-CM | POA: Diagnosis not present

## 2019-02-06 DIAGNOSIS — K219 Gastro-esophageal reflux disease without esophagitis: Secondary | ICD-10-CM | POA: Diagnosis not present

## 2019-02-06 DIAGNOSIS — I10 Essential (primary) hypertension: Secondary | ICD-10-CM | POA: Diagnosis not present

## 2019-02-06 DIAGNOSIS — D649 Anemia, unspecified: Secondary | ICD-10-CM | POA: Diagnosis not present

## 2019-02-06 DIAGNOSIS — E785 Hyperlipidemia, unspecified: Secondary | ICD-10-CM | POA: Diagnosis not present

## 2019-02-06 DIAGNOSIS — D696 Thrombocytopenia, unspecified: Secondary | ICD-10-CM | POA: Diagnosis not present

## 2019-02-06 NOTE — Telephone Encounter (Addendum)
Merry Proud with [facility] calling for verbal orders PT  1 wk 1 2 wk 2 1 wk 2  Home health Aid 2 wk 3  Starting next week  Merry Proud states pt is taking 20 mg of the lovastatin   Not 40 mg Dr Nehemiah Massed is manageing this and our records are incorrect  cb [phone]

## 2019-02-06 NOTE — Telephone Encounter (Signed)
Merry Proud advised; Lovastatin updated.   Thanks,   -Mickel Baas

## 2019-02-06 NOTE — Telephone Encounter (Signed)
OK for verbals.  We can update the record to reflect current statin dose.

## 2019-02-09 DIAGNOSIS — M479 Spondylosis, unspecified: Secondary | ICD-10-CM | POA: Diagnosis not present

## 2019-02-09 DIAGNOSIS — D696 Thrombocytopenia, unspecified: Secondary | ICD-10-CM | POA: Diagnosis not present

## 2019-02-09 DIAGNOSIS — I10 Essential (primary) hypertension: Secondary | ICD-10-CM | POA: Diagnosis not present

## 2019-02-09 DIAGNOSIS — D649 Anemia, unspecified: Secondary | ICD-10-CM | POA: Diagnosis not present

## 2019-02-09 DIAGNOSIS — K7460 Unspecified cirrhosis of liver: Secondary | ICD-10-CM | POA: Diagnosis not present

## 2019-02-09 DIAGNOSIS — K7290 Hepatic failure, unspecified without coma: Secondary | ICD-10-CM | POA: Diagnosis not present

## 2019-02-09 DIAGNOSIS — I252 Old myocardial infarction: Secondary | ICD-10-CM | POA: Diagnosis not present

## 2019-02-09 DIAGNOSIS — M5010 Cervical disc disorder with radiculopathy, unspecified cervical region: Secondary | ICD-10-CM | POA: Diagnosis not present

## 2019-02-09 DIAGNOSIS — E785 Hyperlipidemia, unspecified: Secondary | ICD-10-CM | POA: Diagnosis not present

## 2019-02-09 DIAGNOSIS — R413 Other amnesia: Secondary | ICD-10-CM | POA: Diagnosis not present

## 2019-02-09 DIAGNOSIS — I071 Rheumatic tricuspid insufficiency: Secondary | ICD-10-CM | POA: Diagnosis not present

## 2019-02-09 DIAGNOSIS — K219 Gastro-esophageal reflux disease without esophagitis: Secondary | ICD-10-CM | POA: Diagnosis not present

## 2019-02-10 ENCOUNTER — Telehealth: Payer: Self-pay | Admitting: Family Medicine

## 2019-02-10 ENCOUNTER — Ambulatory Visit: Payer: Self-pay | Admitting: Family Medicine

## 2019-02-10 DIAGNOSIS — I10 Essential (primary) hypertension: Secondary | ICD-10-CM | POA: Diagnosis not present

## 2019-02-10 DIAGNOSIS — I214 Non-ST elevation (NSTEMI) myocardial infarction: Secondary | ICD-10-CM | POA: Diagnosis not present

## 2019-02-10 DIAGNOSIS — I2510 Atherosclerotic heart disease of native coronary artery without angina pectoris: Secondary | ICD-10-CM | POA: Diagnosis not present

## 2019-02-10 DIAGNOSIS — E782 Mixed hyperlipidemia: Secondary | ICD-10-CM | POA: Diagnosis not present

## 2019-02-10 DIAGNOSIS — I6523 Occlusion and stenosis of bilateral carotid arteries: Secondary | ICD-10-CM | POA: Diagnosis not present

## 2019-02-10 NOTE — Telephone Encounter (Signed)
From [facility] 

## 2019-02-10 NOTE — Telephone Encounter (Signed)
Noted. I guess it can be canceled per patient request

## 2019-02-10 NOTE — Telephone Encounter (Signed)
Shannon Sawyer with [facility] states she did OT eval yesterday.  Pt agreed, but then called back today and changed her mind. Pt has now declined OT, stating she does not want too many people coming in and out her house.  [phone]

## 2019-02-12 ENCOUNTER — Encounter: Payer: Self-pay | Admitting: Neurology

## 2019-02-12 ENCOUNTER — Ambulatory Visit: Payer: Medicare Other | Admitting: Neurology

## 2019-02-12 ENCOUNTER — Other Ambulatory Visit: Payer: Self-pay

## 2019-02-12 VITALS — BP 132/69 | HR 80 | Ht 62.0 in | Wt 154.0 lb

## 2019-02-12 DIAGNOSIS — R413 Other amnesia: Secondary | ICD-10-CM | POA: Diagnosis not present

## 2019-02-12 DIAGNOSIS — D649 Anemia, unspecified: Secondary | ICD-10-CM | POA: Diagnosis not present

## 2019-02-12 DIAGNOSIS — I10 Essential (primary) hypertension: Secondary | ICD-10-CM | POA: Diagnosis not present

## 2019-02-12 DIAGNOSIS — E785 Hyperlipidemia, unspecified: Secondary | ICD-10-CM | POA: Diagnosis not present

## 2019-02-12 DIAGNOSIS — R42 Dizziness and giddiness: Secondary | ICD-10-CM | POA: Diagnosis not present

## 2019-02-12 DIAGNOSIS — K219 Gastro-esophageal reflux disease without esophagitis: Secondary | ICD-10-CM | POA: Diagnosis not present

## 2019-02-12 DIAGNOSIS — K7460 Unspecified cirrhosis of liver: Secondary | ICD-10-CM | POA: Diagnosis not present

## 2019-02-12 DIAGNOSIS — M479 Spondylosis, unspecified: Secondary | ICD-10-CM | POA: Diagnosis not present

## 2019-02-12 DIAGNOSIS — I071 Rheumatic tricuspid insufficiency: Secondary | ICD-10-CM | POA: Diagnosis not present

## 2019-02-12 DIAGNOSIS — M5010 Cervical disc disorder with radiculopathy, unspecified cervical region: Secondary | ICD-10-CM | POA: Diagnosis not present

## 2019-02-12 DIAGNOSIS — I6523 Occlusion and stenosis of bilateral carotid arteries: Secondary | ICD-10-CM | POA: Diagnosis not present

## 2019-02-12 DIAGNOSIS — R269 Unspecified abnormalities of gait and mobility: Secondary | ICD-10-CM

## 2019-02-12 DIAGNOSIS — I252 Old myocardial infarction: Secondary | ICD-10-CM | POA: Diagnosis not present

## 2019-02-12 DIAGNOSIS — K7290 Hepatic failure, unspecified without coma: Secondary | ICD-10-CM | POA: Diagnosis not present

## 2019-02-12 DIAGNOSIS — D696 Thrombocytopenia, unspecified: Secondary | ICD-10-CM | POA: Diagnosis not present

## 2019-02-12 NOTE — Patient Instructions (Addendum)
I do not see any specific signs of balance or gait problems from the primary neurological standpoint, you do walk in securely, probably due to more than 1 issue, having fallen a few times before does make you insecure and less confident with your walking in your balance.  Please change positions slowly as you also feel lightheaded when you change positions too quickly, your blood pressure may be dropping at times.  Please stay well-hydrated with water, 6 to 8 cups of water per day are recommended, 8 ounces each.  Please limit your caffeine and tea intake to about 2 servings per day.  Please use your walker at all times.  I think it is a good idea to do home health physical therapy to work on your gait and balance.  Your neurological exam is otherwise unremarkable and reassuring.  I do not see any signs of Parkinson's or any sign of prior stroke thankfully.  Your CT and brain MRI were unremarkable as well.  I can see you back as needed.Your liver function test has become a little worse over the past 3 months.  You have a follow-up appointment pending with your GI specialist.  Please keep your appointment.  You are being followed by your cardiologist for your carotid artery disease.  Please make a follow-up appointment if you do not have one.  It looks like you had a carotid ultrasound within the last 6 months, last carotid ultrasound in [place] 2020.  I think it would be okay to routinely follow-up with your cardiologist for this.

## 2019-02-12 NOTE — Progress Notes (Signed)
Subjective:    Patient ID: Shannon Sawyer is a 73 y.o. female.  HPI     Star Age, MD, PhD [facility] [address], [address] [place], [place] [address]  Dear Dr. Brita Romp,   I saw your patient, Shannon Sawyer, upon your kind request in my neurologic clinic today for initial consultation of her gait disorder and history of recent fall.  The patient is accompanied by her husband today.  As you know, Shannon Sawyer is a 73 year old right-handed woman with an underlying medical history of coronary artery disease with history of MI, hypertension, degenerative disc disease, nonalcoholic liver cirrhosis, carotid artery disease, anxiety, anemia, allergies, tricuspid insufficiency, and overweight state, who reports feeling off balance and lightheaded for the past 3+ months.  She has had 3 falls, one time she had a forced sit down event at church and did not hurt herself thankfully, and most recently she fell at home when the power went out and they had the fridge leak on the floor which was then wet and she had bent over to mop up the water.  She has a walker but did not bring it today.  She walks in securely, sometimes she seems to veer in one direction per husband's report, sometimes she leans sideways.  She had a physical therapy evaluation at home and was advised to use her walker, she has not started therapy yet, it may start today as I understand. I reviewed your office note from 02/02/2019.  She had a fall little over a month ago.  She hit the back of her head against the dishwasher.  She had a head CT without contrast on 01/14/2019, done through [facility].  I reviewed the results: IMPRESSION: Normal exam. You have ordered a brain MRI.  She had a brain MRI without contrast on 02/03/2019 and I reviewed the results: IMPRESSION: Normal MRI appearance of the brain for age. No acute or focal lesion to explain the patient's symptoms.  She tries  to hydrate well.  Nevertheless, when she stands up quickly she has lightheadedness.  Her husband reports that she had blood pressure testing and the lying, sitting and standing position through your office and blood pressure was fine.  She denies any vertigo symptoms or nausea or vomiting or one-sided weakness or numbness or tingling or droopy face or slurring of speech or recurrent headaches.  She likes to drink tea, ice tea, several cups perhaps per day but is not sure how many, sips on it all day long but also reports drinking up to 4 bottles of water per day, 20 ounce each. She does not drink alcohol, she quit smoking a long time ago, was an intermittent smoker.    Of note, she has bilateral hearing aids but has noted that they do not work as well.  She has a history of carotid artery disease which is followed by her cardiologist and it looks like she had a carotid ultrasound within the last 6 months but I could not pull up the report.  She is advised to follow-up with her cardiologist on this as scheduled. She had recent blood work through your office, liver enzymes have become worse compared to September, she has an appointment for a virtual visit as understand next week with her GI specialist.  Her Past Medical History Is Significant For: Past Medical History:  Diagnosis Date  . Allergy   . Anemia    LOW PLATELET COUNTS  (DR  [facility])  . Anxiety   . Carotid artery disease (HCC)    BILAT.        Marland Kitchen Chronic cough   . Cirrhosis, nonalcoholic (HCC)    PORTAL HTN, FATTY TISSUE   . Coronary artery disease   . DDD (degenerative disc disease), cervical    SPONDYLOLISTHESIS  . Depression   . GERD (gastroesophageal reflux disease)   . Headache    HX OCCULAR MIGRAINES  . Hypertension    ESSENTIAL  . Myocardial infarction ([place])   . Shortness of breath dyspnea    WITH EXERTION   . Tricuspid insufficiency    MODERATE     Her Past Surgical History Is Significant For: Past  Surgical History:  Procedure Laterality Date  . ABDOMINAL HYSTERECTOMY    . ANTERIOR CERVICAL DECOMP/DISCECTOMY FUSION N/A 06/03/2014   Procedure: CERVICAL FIVE-CERVICAL SIX, CERVICAL SIX-CERVICAL SEVEN ANTERIOR CERVICAL DECOMPRESSION/DISCECTOMY ;  Surgeon: Karie Chimera, MD;  Location: [facility];  Service: Neurosurgery;  Laterality: N/A;  . BREAST BIOPSY Left 09/02/2015   benign  . BREAST CYST ASPIRATION Left    neg  . CARDIAC CATHETERIZATION     2 STENTS 2013 IN [place]  . cardiac stents    . ESOPHAGOGASTRODUODENOSCOPY (EGD) WITH PROPOFOL N/A 02/21/2015   Procedure: ESOPHAGOGASTRODUODENOSCOPY (EGD) WITH PROPOFOL;  Surgeon: Josefine Class, MD;  Location: [facility] END[facility];  Service: Endoscopy;  Laterality: N/A;  . EYE SURGERY      Her Family History Is Significant For: Family History  Problem Relation Age of Onset  . Liver cancer Mother   . Lymphoma Father   . COPD Father   . COPD Brother   . Liver cancer Son   . Breast cancer Neg Hx   . Colon cancer Neg Hx     Her Social History Is Significant For: Social History   Socioeconomic History  . Marital status: Married    Spouse name: Shannon Sawyer  . Number of children: 2  . Years of education: Not on file  . Highest education level: Some college, no degree  Occupational History  . 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 Occupation: retired  Tobacco Use  . Smoking status: Former Smoker    Types: Cigarettes  . Smokeless tobacco: Never Used  . Tobacco comment: formerly tried smoking cigarettes, no regular use  Substance and Sexual Activity  . Alcohol use: No  . Drug use: No  . Sexual activity: Yes    Partners: Male    Birth control/protection: Post-menopausal, Surgical  Other Topics Concern  . Not on file  Social History Narrative   One child was stillborn, and one son died of liver cancer. Pt has one living biological child, and 2 stepchildren.   Social Determinants of Health   Financial Resource Strain:  Low Risk   . Difficulty of Paying Living Expenses: Not hard at all  Food Insecurity: No Food Insecurity  . Worried About Charity fundraiser in the Last Year: Never true  . Ran Out of Food in the Last Year: Never true  Transportation Needs: No Transportation Needs  . Lack of Transportation (Medical): No  . Lack of Transportation (Non-Medical): No  Physical Activity: Inactive  . Days of Exercise per Week: 0 days  . Minutes of Exercise per Session: 0 min  Stress: No Stress Concern Present  . Feeling of Stress : Not at all  Social Connections: Unknown  . Frequency of Communication with Friends and Family: Patient refused  .  Frequency of Social Gatherings with Friends and Family: Patient refused  . Attends Religious Services: Patient refused  . Active Member of Clubs or Organizations: Patient refused  . Attends Archivist Meetings: Patient refused  . Marital Status: Patient refused    Her Allergies Are:  Allergies  Allergen Reactions  . Contrast Media  [Iodinated Diagnostic Agents] Rash    Blister rash   . Azelastine Swelling  . Flonase [Fluticasone Propionate] Swelling  . Prednisone Swelling  . Shellfish Allergy Swelling and Other (See Comments)    Per spouse, determined on allergy test.  . Tramadol Nausea Only  :   Her Current Medications Are:  Outpatient Encounter Medications as of 02/12/2019  Medication Sig  . acetaminophen (TYLENOL) 500 MG tablet Take 500 mg by mouth every 6 (six) hours as needed.   Marland Kitchen aspirin EC 81 MG tablet Take 81 mg by mouth daily.   . cetirizine (ZYRTEC) 10 MG tablet Take 10 mg by mouth daily.  Marland Kitchen lactulose (CHRONULAC) 10 GM/15ML solution Take 20 g by mouth 2 (two) times daily.  Marland Kitchen losartan (COZAAR) 25 MG tablet Take 25 mg by mouth daily.  Marland Kitchen lovastatin (MEVACOR) 20 MG tablet TAKE 1 TABLET BY MOUTH EVERY DAY WITH DINNER  . nitroGLYCERIN (NITROSTAT) 0.4 MG SL tablet Place 0.4 mg under the tongue every 5 (five) minutes as needed for chest pain.    Marland Kitchen omeprazole (PRILOSEC) 20 MG capsule Take 20 mg by mouth daily.   . rifaximin (XIFAXAN) 200 MG tablet Take 400 mg by mouth daily.  Marland Kitchen triamcinolone cream (KENALOG) 0.1 % APPLY ON THE SKIN TWICE A DAY AS NEEDED ITCHING. AVOID FACE, GROIN, UNDERARMS  . Zinc Sulfate (ZINC 15 PO) Take by mouth daily. Unsure of dose  . LORazepam (ATIVAN) 0.5 MG tablet Take 0.5mg  PO once 30 minutes prior to procedure. May repeat dose x1 if needed before or during procedure. (Patient not taking: Reported on 02/12/2019)  . [EXPIRED] nadolol (CORGARD) 20 MG tablet Take 10 mg by mouth daily.   . [EXPIRED] spironolactone (ALDACTONE) 25 MG tablet Take 25 mg by mouth 2 (two) times daily.   . [DISCONTINUED] montelukast (SINGULAIR) 10 MG tablet Take 1 tablet (10 mg total) by mouth at bedtime.   No facility-administered encounter medications on file as of 02/12/2019.  :   Review of Systems:  Out of a complete 14 point review of systems, all are reviewed and negative with the exception of these symptoms as listed below:  Review of Systems  Neurological:       Pt presents today to discuss her gait. Pt's gait has slowed down. Pt reports that PT has been ordered for her but not started.    Objective:  Neurological Exam  Physical Exam Physical Examination:   Vitals:   02/12/19 0827  BP: 132/69  Pulse: 80    General Examination: The patient is a very pleasant 73 y.o. female in no acute distress. She appears well-developed and well-nourished and well groomed.   HEENT: Normocephalic, atraumatic, pupils are equal, round and reactive to light and accommodation. Corrective eyeglasses in place, status post bilateral cataract repairs, extraocular tracking is well preserved, hearing is impaired, despite hearing aids.  Face is symmetric with normal facial animation and normal facial sensation, no involuntary movements, speech is clear, slightly slow but no dysarthria.  She has no lip, neck or jaw tremor, neck is supple, no  carotid bruits.  Airway examination reveals nonfocal findings, tongue protrudes centrally in palate elevates symmetrically.  Chest: Clear to auscultation without wheezing, rhonchi or crackles noted.  Heart: S1+S2+0, regular and normal without murmurs, rubs or gallops noted.   Abdomen: Soft, non-tender and non-distended with normal bowel sounds appreciated on auscultation.  Extremities: There is no pitting edema in the distal lower extremities bilaterally. Pedal pulses are intact.  Skin: Warm and dry without trophic changes noted.  Musculoskeletal: exam reveals no obvious joint deformities, tenderness or joint swelling or erythema.   Neurologically:  Mental status: The patient is awake, alert and oriented in all 4 spheres. Her immediate and remote memory, attention, language skills and fund of knowledge are fairly appropriate, Some difficulty with communication due to her hearing impairment. There is no evidence of aphasia, agnosia, apraxia or anomia. Speech is clear with normal prosody and enunciation. Thought process is linear. Mood is normal and affect is normal.  Cranial nerves II - XII are as described above under HEENT exam. In addition: shoulder shrug is normal with equal shoulder height noted. Motor exam: Normal bulk, strength and tone is noted. There is no drift, tremor or rebound. Reflexes are 2+ throughout. Babinski: Toes are flexor bilaterally. Fine motor skills and coordination: intact with normal finger taps, normal hand movements, normal rapid alternating patting, normal foot taps and normal foot agility. Overall, slightly slow and deliberate but no decrement in amplitude.  She has no resting tremor, no postural or action tremor, no intention tremor.  Cerebellar testing: No dysmetria or intention tremor on finger to nose testing. Heel to shin is unremarkable bilaterally. There is no truncal or gait ataxia.  No asterixis or myoclonus. Sensory exam: intact to light touch, vibration,  temperature sense in the upper and lower extremities.  Gait, station and balance: She stands Slowly and initially stands somewhat wide-based, she has no corrective steps with Romberg testing but has slight swaying. She did not bring her walker today.  She walks slowly and cautiously and slightly wide-based, no shuffling, preserved arm swing noted, no freezing, no one-sided limp or dragging.  Assessment and Plan:  In summary, Shannon Sawyer is a very pleasant 73 y.o.-year old female with an underlying medical history of coronary artery disease with history of MI, hypertension, degenerative disc disease, nonalcoholic liver cirrhosis, carotid artery disease, anxiety, anemia, allergies, tricuspid insufficiency, and overweight state, who Presents for evaluation of her gait disorder. On examination, she has a 9 specific gait disorder, more in keeping with insecurity and lack of confidence while walking.  She has no one-sided weakness, no signs of parkinsonism and her history and exam are not suggestive of any recent TIA or stroke.  She has had some worsening liver function and is in follow-up with her GI doctor.  She is advised to stay well-hydrated and change positions slowly.  She has had some orthostatic symptoms as well, no evidence of vertigo today and history is also not suggestive of vertiginous symptoms.She is advised to proceed with physical therapy as planned, she is advised to use her walker at all times.  She is encouraged to follow-up with her specialists including her GI specialist and also her cardiologist who follows her for her carotid artery disease, it looks like she had a carotid Doppler ultrasound within the last 6 months. If she does not have an appointment with her cardiologist, she is encouraged to make 1.  Furthermore, she is encouraged to get her hearing aids checked as she does have trouble hearing despite her hearing aids and it may help to get them checked out, this  may help her balance  as well.She is reassured that her head CT and brain MRI were reassuring and her exam otherwise is also reassuring.I suggested as needed follow-up with me.  I answered all their questions today and the patient and her husband were in agreement, she is encouraged to limit her iced tea intake to 2 servings per day and stay well-hydrated with water, 6 to 8 cups/day, 8 ounce each.  Thank you very much for allowing me to participate in the care of this nice patient. If I can be of any further assistance to you please do not hesitate to call me at [phone].  Sincerely,   Star Age, MD, PhD

## 2019-02-13 DIAGNOSIS — K219 Gastro-esophageal reflux disease without esophagitis: Secondary | ICD-10-CM | POA: Diagnosis not present

## 2019-02-13 DIAGNOSIS — R413 Other amnesia: Secondary | ICD-10-CM | POA: Diagnosis not present

## 2019-02-13 DIAGNOSIS — D649 Anemia, unspecified: Secondary | ICD-10-CM | POA: Diagnosis not present

## 2019-02-13 DIAGNOSIS — I10 Essential (primary) hypertension: Secondary | ICD-10-CM | POA: Diagnosis not present

## 2019-02-13 DIAGNOSIS — M479 Spondylosis, unspecified: Secondary | ICD-10-CM | POA: Diagnosis not present

## 2019-02-13 DIAGNOSIS — I071 Rheumatic tricuspid insufficiency: Secondary | ICD-10-CM | POA: Diagnosis not present

## 2019-02-13 DIAGNOSIS — D696 Thrombocytopenia, unspecified: Secondary | ICD-10-CM | POA: Diagnosis not present

## 2019-02-13 DIAGNOSIS — K7460 Unspecified cirrhosis of liver: Secondary | ICD-10-CM | POA: Diagnosis not present

## 2019-02-13 DIAGNOSIS — K7290 Hepatic failure, unspecified without coma: Secondary | ICD-10-CM | POA: Diagnosis not present

## 2019-02-13 DIAGNOSIS — M5010 Cervical disc disorder with radiculopathy, unspecified cervical region: Secondary | ICD-10-CM | POA: Diagnosis not present

## 2019-02-13 DIAGNOSIS — E785 Hyperlipidemia, unspecified: Secondary | ICD-10-CM | POA: Diagnosis not present

## 2019-02-13 DIAGNOSIS — I252 Old myocardial infarction: Secondary | ICD-10-CM | POA: Diagnosis not present

## 2019-02-16 ENCOUNTER — Institutional Professional Consult (permissible substitution): Admit: 2019-02-16 | Discharge: 2019-02-17 | Payer: MEDICARE | Attending: Gastroenterology | Primary: Gastroenterology

## 2019-02-16 DIAGNOSIS — K219 Gastro-esophageal reflux disease without esophagitis: Secondary | ICD-10-CM | POA: Diagnosis not present

## 2019-02-16 DIAGNOSIS — D696 Thrombocytopenia, unspecified: Secondary | ICD-10-CM | POA: Diagnosis not present

## 2019-02-16 DIAGNOSIS — D649 Anemia, unspecified: Secondary | ICD-10-CM | POA: Diagnosis not present

## 2019-02-16 DIAGNOSIS — M479 Spondylosis, unspecified: Secondary | ICD-10-CM | POA: Diagnosis not present

## 2019-02-16 DIAGNOSIS — E785 Hyperlipidemia, unspecified: Secondary | ICD-10-CM | POA: Diagnosis not present

## 2019-02-16 DIAGNOSIS — I252 Old myocardial infarction: Secondary | ICD-10-CM | POA: Diagnosis not present

## 2019-02-16 DIAGNOSIS — K7290 Hepatic failure, unspecified without coma: Secondary | ICD-10-CM | POA: Diagnosis not present

## 2019-02-16 DIAGNOSIS — I10 Essential (primary) hypertension: Secondary | ICD-10-CM | POA: Diagnosis not present

## 2019-02-16 DIAGNOSIS — R7303 Prediabetes: Secondary | ICD-10-CM | POA: Diagnosis not present

## 2019-02-16 DIAGNOSIS — R413 Other amnesia: Secondary | ICD-10-CM | POA: Diagnosis not present

## 2019-02-16 DIAGNOSIS — M5010 Cervical disc disorder with radiculopathy, unspecified cervical region: Secondary | ICD-10-CM | POA: Diagnosis not present

## 2019-02-16 DIAGNOSIS — I071 Rheumatic tricuspid insufficiency: Secondary | ICD-10-CM | POA: Diagnosis not present

## 2019-02-16 DIAGNOSIS — K7460 Unspecified cirrhosis of liver: Secondary | ICD-10-CM | POA: Diagnosis not present

## 2019-02-19 DIAGNOSIS — R7303 Prediabetes: Secondary | ICD-10-CM | POA: Diagnosis not present

## 2019-02-19 DIAGNOSIS — K7460 Unspecified cirrhosis of liver: Secondary | ICD-10-CM | POA: Diagnosis not present

## 2019-02-20 DIAGNOSIS — K7290 Hepatic failure, unspecified without coma: Secondary | ICD-10-CM | POA: Diagnosis not present

## 2019-02-20 DIAGNOSIS — K219 Gastro-esophageal reflux disease without esophagitis: Secondary | ICD-10-CM | POA: Diagnosis not present

## 2019-02-20 DIAGNOSIS — E785 Hyperlipidemia, unspecified: Secondary | ICD-10-CM | POA: Diagnosis not present

## 2019-02-20 DIAGNOSIS — K7460 Unspecified cirrhosis of liver: Secondary | ICD-10-CM | POA: Diagnosis not present

## 2019-02-20 DIAGNOSIS — M5010 Cervical disc disorder with radiculopathy, unspecified cervical region: Secondary | ICD-10-CM | POA: Diagnosis not present

## 2019-02-20 DIAGNOSIS — D649 Anemia, unspecified: Secondary | ICD-10-CM | POA: Diagnosis not present

## 2019-02-20 DIAGNOSIS — I10 Essential (primary) hypertension: Secondary | ICD-10-CM | POA: Diagnosis not present

## 2019-02-20 DIAGNOSIS — I252 Old myocardial infarction: Secondary | ICD-10-CM | POA: Diagnosis not present

## 2019-02-20 DIAGNOSIS — D696 Thrombocytopenia, unspecified: Secondary | ICD-10-CM | POA: Diagnosis not present

## 2019-02-20 DIAGNOSIS — I071 Rheumatic tricuspid insufficiency: Secondary | ICD-10-CM | POA: Diagnosis not present

## 2019-02-20 DIAGNOSIS — M479 Spondylosis, unspecified: Secondary | ICD-10-CM | POA: Diagnosis not present

## 2019-02-20 DIAGNOSIS — R413 Other amnesia: Secondary | ICD-10-CM | POA: Diagnosis not present

## 2019-02-20 DIAGNOSIS — Z79899 Other long term (current) drug therapy: Principal | ICD-10-CM

## 2019-02-20 DIAGNOSIS — E871 Hypo-osmolality and hyponatremia: Principal | ICD-10-CM

## 2019-02-20 MED ORDER — POTASSIUM CHLORIDE ER 10 MEQ TABLET,EXTENDED RELEASE(PART/CRYST)
ORAL_TABLET | Freq: Every day | ORAL | 4 refills | 30.00000 days | Status: CP
Start: 2019-02-20 — End: 2020-02-20

## 2019-02-20 MED ORDER — FUROSEMIDE 20 MG TABLET
ORAL_TABLET | Freq: Every day | ORAL | 11 refills | 30 days | Status: CP
Start: 2019-02-20 — End: 2020-02-20

## 2019-02-25 DIAGNOSIS — M5010 Cervical disc disorder with radiculopathy, unspecified cervical region: Secondary | ICD-10-CM | POA: Diagnosis not present

## 2019-02-25 DIAGNOSIS — M479 Spondylosis, unspecified: Secondary | ICD-10-CM | POA: Diagnosis not present

## 2019-02-25 DIAGNOSIS — I071 Rheumatic tricuspid insufficiency: Secondary | ICD-10-CM | POA: Diagnosis not present

## 2019-02-25 DIAGNOSIS — K219 Gastro-esophageal reflux disease without esophagitis: Secondary | ICD-10-CM | POA: Diagnosis not present

## 2019-02-25 DIAGNOSIS — E785 Hyperlipidemia, unspecified: Secondary | ICD-10-CM | POA: Diagnosis not present

## 2019-02-25 DIAGNOSIS — I252 Old myocardial infarction: Secondary | ICD-10-CM | POA: Diagnosis not present

## 2019-02-25 DIAGNOSIS — K7460 Unspecified cirrhosis of liver: Secondary | ICD-10-CM | POA: Diagnosis not present

## 2019-02-25 DIAGNOSIS — K7290 Hepatic failure, unspecified without coma: Secondary | ICD-10-CM | POA: Diagnosis not present

## 2019-02-25 DIAGNOSIS — I10 Essential (primary) hypertension: Secondary | ICD-10-CM | POA: Diagnosis not present

## 2019-02-25 DIAGNOSIS — D649 Anemia, unspecified: Secondary | ICD-10-CM | POA: Diagnosis not present

## 2019-02-25 DIAGNOSIS — D696 Thrombocytopenia, unspecified: Secondary | ICD-10-CM | POA: Diagnosis not present

## 2019-02-25 DIAGNOSIS — R413 Other amnesia: Secondary | ICD-10-CM | POA: Diagnosis not present

## 2019-03-02 DIAGNOSIS — M5010 Cervical disc disorder with radiculopathy, unspecified cervical region: Secondary | ICD-10-CM | POA: Diagnosis not present

## 2019-03-02 DIAGNOSIS — M479 Spondylosis, unspecified: Secondary | ICD-10-CM | POA: Diagnosis not present

## 2019-03-02 DIAGNOSIS — K7290 Hepatic failure, unspecified without coma: Secondary | ICD-10-CM | POA: Diagnosis not present

## 2019-03-02 DIAGNOSIS — I10 Essential (primary) hypertension: Secondary | ICD-10-CM | POA: Diagnosis not present

## 2019-03-02 DIAGNOSIS — K7460 Unspecified cirrhosis of liver: Secondary | ICD-10-CM | POA: Diagnosis not present

## 2019-03-02 DIAGNOSIS — D696 Thrombocytopenia, unspecified: Secondary | ICD-10-CM | POA: Diagnosis not present

## 2019-03-02 DIAGNOSIS — I071 Rheumatic tricuspid insufficiency: Secondary | ICD-10-CM | POA: Diagnosis not present

## 2019-03-02 DIAGNOSIS — I252 Old myocardial infarction: Secondary | ICD-10-CM | POA: Diagnosis not present

## 2019-03-02 DIAGNOSIS — K219 Gastro-esophageal reflux disease without esophagitis: Secondary | ICD-10-CM | POA: Diagnosis not present

## 2019-03-02 DIAGNOSIS — E785 Hyperlipidemia, unspecified: Secondary | ICD-10-CM | POA: Diagnosis not present

## 2019-03-02 DIAGNOSIS — R413 Other amnesia: Secondary | ICD-10-CM | POA: Diagnosis not present

## 2019-03-02 DIAGNOSIS — D649 Anemia, unspecified: Secondary | ICD-10-CM | POA: Diagnosis not present

## 2019-03-10 DIAGNOSIS — K7460 Unspecified cirrhosis of liver: Secondary | ICD-10-CM | POA: Diagnosis not present

## 2019-03-10 DIAGNOSIS — Z79899 Other long term (current) drug therapy: Secondary | ICD-10-CM | POA: Diagnosis not present

## 2019-03-10 DIAGNOSIS — E871 Hypo-osmolality and hyponatremia: Secondary | ICD-10-CM | POA: Diagnosis not present

## 2019-03-17 ENCOUNTER — Ambulatory Visit: Admit: 2019-03-17 | Discharge: 2019-03-18 | Payer: MEDICARE | Attending: Internal Medicine | Primary: Internal Medicine

## 2019-03-17 DIAGNOSIS — R188 Other ascites: Secondary | ICD-10-CM | POA: Diagnosis not present

## 2019-03-18 DIAGNOSIS — K7460 Unspecified cirrhosis of liver: Principal | ICD-10-CM

## 2019-03-18 MED ORDER — SPIRONOLACTONE 25 MG TABLET
ORAL_TABLET | Freq: Every day | ORAL | 3 refills | 45 days | Status: CP
Start: 2019-03-18 — End: 2020-03-17

## 2019-03-24 DIAGNOSIS — K7460 Unspecified cirrhosis of liver: Secondary | ICD-10-CM | POA: Diagnosis not present

## 2019-03-26 DIAGNOSIS — K7460 Unspecified cirrhosis of liver: Principal | ICD-10-CM

## 2019-03-31 DIAGNOSIS — K7290 Hepatic failure, unspecified without coma: Principal | ICD-10-CM

## 2019-03-31 MED ORDER — RIFAXIMIN 550 MG TABLET
ORAL_TABLET | Freq: Two times a day (BID) | ORAL | 0 refills | 30 days | Status: CP
Start: 2019-03-31 — End: ?

## 2019-04-06 DIAGNOSIS — K7460 Unspecified cirrhosis of liver: Principal | ICD-10-CM

## 2019-04-15 ENCOUNTER — Encounter: Admit: 2019-04-15 | Discharge: 2019-04-16 | Payer: MEDICARE

## 2019-04-20 DIAGNOSIS — K7460 Unspecified cirrhosis of liver: Principal | ICD-10-CM

## 2019-05-04 DIAGNOSIS — K7460 Unspecified cirrhosis of liver: Principal | ICD-10-CM

## 2019-05-13 ENCOUNTER — Encounter: Admit: 2019-05-13 | Discharge: 2019-05-14 | Payer: MEDICARE

## 2019-05-13 DIAGNOSIS — K7290 Hepatic failure, unspecified without coma: Principal | ICD-10-CM

## 2019-05-13 DIAGNOSIS — K7460 Unspecified cirrhosis of liver: Principal | ICD-10-CM

## 2019-05-13 MED ORDER — LACTULOSE 10 GRAM/15 ML ORAL SOLUTION
ORAL | 11 refills | 0 days | Status: CP
Start: 2019-05-13 — End: ?

## 2019-05-15 MED ORDER — KLOR-CON M10 MEQ TABLET,EXTENDED RELEASE
ORAL_TABLET | 1 refills | 0 days | Status: CP
Start: 2019-05-15 — End: ?

## 2019-05-18 DIAGNOSIS — K7460 Unspecified cirrhosis of liver: Principal | ICD-10-CM

## 2019-05-25 ENCOUNTER — Encounter: Admit: 2019-05-25 | Discharge: 2019-05-26 | Payer: MEDICARE | Attending: Gastroenterology | Primary: Gastroenterology

## 2019-05-25 DIAGNOSIS — K7460 Unspecified cirrhosis of liver: Secondary | ICD-10-CM | POA: Diagnosis not present

## 2019-05-25 MED ORDER — ONDANSETRON HCL 4 MG TABLET
ORAL_TABLET | Freq: Three times a day (TID) | ORAL | 5 refills | 10.00000 days | Status: CP | PRN
Start: 2019-05-25 — End: 2019-06-01

## 2019-05-25 MED ORDER — MIRTAZAPINE 7.5 MG TABLET
ORAL_TABLET | Freq: Every evening | ORAL | 3 refills | 30.00000 days | Status: CP
Start: 2019-05-25 — End: 2019-06-24

## 2019-05-29 ENCOUNTER — Inpatient Hospital Stay: Payer: Medicare Other

## 2019-05-29 ENCOUNTER — Emergency Department: Payer: Medicare Other

## 2019-05-29 ENCOUNTER — Other Ambulatory Visit: Payer: Self-pay

## 2019-05-29 ENCOUNTER — Encounter: Payer: Self-pay | Admitting: Emergency Medicine

## 2019-05-29 ENCOUNTER — Inpatient Hospital Stay
Admission: EM | Admit: 2019-05-29 | Discharge: 2019-06-01 | DRG: 682 | Disposition: A | Discharge status: Skilled Nursing Facility | Payer: Medicare Other | Attending: Internal Medicine | Admitting: Internal Medicine

## 2019-05-29 DIAGNOSIS — K859 Acute pancreatitis without necrosis or infection, unspecified: Secondary | ICD-10-CM | POA: Diagnosis not present

## 2019-05-29 DIAGNOSIS — Z87891 Personal history of nicotine dependence: Secondary | ICD-10-CM | POA: Diagnosis not present

## 2019-05-29 DIAGNOSIS — R262 Difficulty in walking, not elsewhere classified: Secondary | ICD-10-CM | POA: Diagnosis present

## 2019-05-29 DIAGNOSIS — D696 Thrombocytopenia, unspecified: Secondary | ICD-10-CM | POA: Diagnosis not present

## 2019-05-29 DIAGNOSIS — Z91041 Radiographic dye allergy status: Secondary | ICD-10-CM

## 2019-05-29 DIAGNOSIS — Z20822 Contact with and (suspected) exposure to covid-19: Secondary | ICD-10-CM | POA: Diagnosis present

## 2019-05-29 DIAGNOSIS — R140 Abdominal distension (gaseous): Secondary | ICD-10-CM

## 2019-05-29 DIAGNOSIS — I2510 Atherosclerotic heart disease of native coronary artery without angina pectoris: Secondary | ICD-10-CM | POA: Diagnosis not present

## 2019-05-29 DIAGNOSIS — I129 Hypertensive chronic kidney disease with stage 1 through stage 4 chronic kidney disease, or unspecified chronic kidney disease: Secondary | ICD-10-CM | POA: Diagnosis present

## 2019-05-29 DIAGNOSIS — W19XXXA Unspecified fall, initial encounter: Secondary | ICD-10-CM | POA: Diagnosis not present

## 2019-05-29 DIAGNOSIS — Z825 Family history of asthma and other chronic lower respiratory diseases: Secondary | ICD-10-CM

## 2019-05-29 DIAGNOSIS — I071 Rheumatic tricuspid insufficiency: Secondary | ICD-10-CM | POA: Diagnosis not present

## 2019-05-29 DIAGNOSIS — E871 Hypo-osmolality and hyponatremia: Secondary | ICD-10-CM | POA: Diagnosis present

## 2019-05-29 DIAGNOSIS — I6529 Occlusion and stenosis of unspecified carotid artery: Secondary | ICD-10-CM | POA: Diagnosis present

## 2019-05-29 DIAGNOSIS — K7581 Nonalcoholic steatohepatitis (NASH): Secondary | ICD-10-CM | POA: Diagnosis present

## 2019-05-29 DIAGNOSIS — K7460 Unspecified cirrhosis of liver: Secondary | ICD-10-CM | POA: Diagnosis not present

## 2019-05-29 DIAGNOSIS — N1831 Chronic kidney disease, stage 3a: Secondary | ICD-10-CM

## 2019-05-29 DIAGNOSIS — K7469 Other cirrhosis of liver: Secondary | ICD-10-CM | POA: Diagnosis not present

## 2019-05-29 DIAGNOSIS — R2681 Unsteadiness on feet: Secondary | ICD-10-CM | POA: Diagnosis not present

## 2019-05-29 DIAGNOSIS — I252 Old myocardial infarction: Secondary | ICD-10-CM

## 2019-05-29 DIAGNOSIS — N189 Chronic kidney disease, unspecified: Secondary | ICD-10-CM | POA: Diagnosis not present

## 2019-05-29 DIAGNOSIS — Z91013 Allergy to seafood: Secondary | ICD-10-CM | POA: Diagnosis not present

## 2019-05-29 DIAGNOSIS — Z7982 Long term (current) use of aspirin: Secondary | ICD-10-CM

## 2019-05-29 DIAGNOSIS — Z888 Allergy status to other drugs, medicaments and biological substances status: Secondary | ICD-10-CM | POA: Diagnosis not present

## 2019-05-29 DIAGNOSIS — F419 Anxiety disorder, unspecified: Secondary | ICD-10-CM | POA: Diagnosis present

## 2019-05-29 DIAGNOSIS — Z800 Family history of malignant neoplasm of digestive organs: Secondary | ICD-10-CM

## 2019-05-29 DIAGNOSIS — F329 Major depressive disorder, single episode, unspecified: Secondary | ICD-10-CM | POA: Diagnosis present

## 2019-05-29 DIAGNOSIS — R531 Weakness: Secondary | ICD-10-CM

## 2019-05-29 DIAGNOSIS — Z743 Need for continuous supervision: Secondary | ICD-10-CM | POA: Diagnosis not present

## 2019-05-29 DIAGNOSIS — K219 Gastro-esophageal reflux disease without esophagitis: Secondary | ICD-10-CM | POA: Diagnosis present

## 2019-05-29 DIAGNOSIS — R0602 Shortness of breath: Secondary | ICD-10-CM | POA: Diagnosis present

## 2019-05-29 DIAGNOSIS — E860 Dehydration: Secondary | ICD-10-CM | POA: Diagnosis present

## 2019-05-29 DIAGNOSIS — I10 Essential (primary) hypertension: Secondary | ICD-10-CM

## 2019-05-29 DIAGNOSIS — E785 Hyperlipidemia, unspecified: Secondary | ICD-10-CM | POA: Diagnosis present

## 2019-05-29 DIAGNOSIS — I2582 Chronic total occlusion of coronary artery: Secondary | ICD-10-CM | POA: Diagnosis not present

## 2019-05-29 DIAGNOSIS — D509 Iron deficiency anemia, unspecified: Secondary | ICD-10-CM | POA: Diagnosis present

## 2019-05-29 DIAGNOSIS — Z7401 Bed confinement status: Secondary | ICD-10-CM | POA: Diagnosis not present

## 2019-05-29 DIAGNOSIS — I959 Hypotension, unspecified: Secondary | ICD-10-CM | POA: Diagnosis not present

## 2019-05-29 DIAGNOSIS — Z79899 Other long term (current) drug therapy: Secondary | ICD-10-CM

## 2019-05-29 DIAGNOSIS — D631 Anemia in chronic kidney disease: Secondary | ICD-10-CM | POA: Diagnosis present

## 2019-05-29 DIAGNOSIS — R188 Other ascites: Secondary | ICD-10-CM | POA: Diagnosis not present

## 2019-05-29 DIAGNOSIS — N179 Acute kidney failure, unspecified: Secondary | ICD-10-CM | POA: Diagnosis not present

## 2019-05-29 DIAGNOSIS — M2550 Pain in unspecified joint: Secondary | ICD-10-CM | POA: Diagnosis not present

## 2019-05-29 DIAGNOSIS — Z807 Family history of other malignant neoplasms of lymphoid, hematopoietic and related tissues: Secondary | ICD-10-CM

## 2019-05-29 DIAGNOSIS — R0902 Hypoxemia: Secondary | ICD-10-CM | POA: Diagnosis not present

## 2019-05-29 DIAGNOSIS — M6281 Muscle weakness (generalized): Secondary | ICD-10-CM | POA: Diagnosis not present

## 2019-05-29 DIAGNOSIS — R05 Cough: Secondary | ICD-10-CM | POA: Diagnosis present

## 2019-05-29 LAB — URINALYSIS, COMPLETE (UACMP) WITH MICROSCOPIC
Bacteria, UA: NONE SEEN
Bilirubin Urine: NEGATIVE
Glucose, UA: NEGATIVE mg/dL
Hgb urine dipstick: NEGATIVE
Ketones, ur: NEGATIVE mg/dL
Leukocytes,Ua: NEGATIVE
Nitrite: NEGATIVE
Protein, ur: NEGATIVE mg/dL
Specific Gravity, Urine: 1.011 (ref 1.005–1.030)
pH: 5 (ref 5.0–8.0)

## 2019-05-29 LAB — CBC WITH DIFFERENTIAL/PLATELET
Abs Immature Granulocytes: 0.14 10*3/uL — ABNORMAL HIGH (ref 0.00–0.07)
Basophils Absolute: 0 10*3/uL (ref 0.0–0.1)
Basophils Relative: 0 %
Eosinophils Absolute: 0.5 10*3/uL (ref 0.0–0.5)
Eosinophils Relative: 3 %
HCT: 31.5 % — ABNORMAL LOW (ref 36.0–46.0)
Hemoglobin: 11.4 g/dL — ABNORMAL LOW (ref 12.0–15.0)
Immature Granulocytes: 1 %
Lymphocytes Relative: 18 %
Lymphs Abs: 2.8 10*3/uL (ref 0.7–4.0)
MCH: 33.9 pg (ref 26.0–34.0)
MCHC: 36.2 g/dL — ABNORMAL HIGH (ref 30.0–36.0)
MCV: 93.8 fL (ref 80.0–100.0)
Monocytes Absolute: 1.9 10*3/uL — ABNORMAL HIGH (ref 0.1–1.0)
Monocytes Relative: 13 %
Neutro Abs: 9.9 10*3/uL — ABNORMAL HIGH (ref 1.7–7.7)
Neutrophils Relative %: 65 %
Platelets: 121 10*3/uL — ABNORMAL LOW (ref 150–400)
RBC: 3.36 MIL/uL — ABNORMAL LOW (ref 3.87–5.11)
RDW: 16.1 % — ABNORMAL HIGH (ref 11.5–15.5)
WBC: 15.3 10*3/uL — ABNORMAL HIGH (ref 4.0–10.5)
nRBC: 0 % (ref 0.0–0.2)

## 2019-05-29 LAB — MAGNESIUM: Magnesium: 1.7 mg/dL (ref 1.7–2.4)

## 2019-05-29 LAB — COMPREHENSIVE METABOLIC PANEL
ALT: 31 U/L (ref 0–44)
AST: 61 U/L — ABNORMAL HIGH (ref 15–41)
Albumin: 2.7 g/dL — ABNORMAL LOW (ref 3.5–5.0)
Alkaline Phosphatase: 158 U/L — ABNORMAL HIGH (ref 38–126)
Anion gap: 12 (ref 5–15)
BUN: 40 mg/dL — ABNORMAL HIGH (ref 8–23)
CO2: 19 mmol/L — ABNORMAL LOW (ref 22–32)
Calcium: 9.5 mg/dL (ref 8.9–10.3)
Chloride: 99 mmol/L (ref 98–111)
Creatinine, Ser: 2.27 mg/dL — ABNORMAL HIGH (ref 0.44–1.00)
GFR calc Af Amer: 24 mL/min — ABNORMAL LOW (ref >60)
GFR calc non Af Amer: 21 mL/min — ABNORMAL LOW (ref >60)
Glucose, Bld: 103 mg/dL — ABNORMAL HIGH (ref 70–99)
Potassium: 3.9 mmol/L (ref 3.5–5.1)
Sodium: 130 mmol/L — ABNORMAL LOW (ref 135–145)
Total Bilirubin: 2.9 mg/dL — ABNORMAL HIGH (ref 0.3–1.2)
Total Protein: 6.4 g/dL — ABNORMAL LOW (ref 6.5–8.1)

## 2019-05-29 LAB — POC SARS CORONAVIRUS 2 AG: SARS Coronavirus 2 Ag: NEGATIVE

## 2019-05-29 LAB — PROTIME-INR
INR: 1.3 — ABNORMAL HIGH (ref 0.8–1.2)
Prothrombin Time: 16.3 seconds — ABNORMAL HIGH (ref 11.4–15.2)

## 2019-05-29 LAB — AMMONIA: Ammonia: 26 umol/L (ref 9–35)

## 2019-05-29 LAB — LIPASE, BLOOD: Lipase: 77 U/L — ABNORMAL HIGH (ref 11–51)

## 2019-05-29 LAB — LACTATE DEHYDROGENASE: LDH: 309 U/L — ABNORMAL HIGH (ref 98–192)

## 2019-05-29 LAB — RESPIRATORY PANEL BY RT PCR (FLU A&B, COVID)
Influenza A by PCR: NEGATIVE
Influenza B by PCR: NEGATIVE
SARS Coronavirus 2 by RT PCR: NEGATIVE

## 2019-05-29 MED ORDER — NITROGLYCERIN 0.4 MG SL SUBL: 0.4000 mg | SUBLINGUAL_TABLET | SUBLINGUAL | Status: DC | PRN

## 2019-05-29 MED ORDER — LACTULOSE 10 GM/15ML PO SOLN
20.0000 g | Freq: Every day | ORAL | Status: DC
  Administered 2019-05-29 – 2019-06-01 (×4): 20 g via ORAL
  Filled 2019-05-29 (×4): qty 30

## 2019-05-29 MED ORDER — SODIUM CHLORIDE 0.9 % IV SOLN
2.0000 g | INTRAVENOUS | Status: DC
  Administered 2019-05-29 – 2019-05-30 (×2): 2 g via INTRAVENOUS
  Filled 2019-05-29 (×2): qty 20
  Filled 2019-05-29: qty 2

## 2019-05-29 MED ORDER — ASPIRIN EC 81 MG PO TBEC
81.0000 mg | DELAYED_RELEASE_TABLET | Freq: Every day | ORAL | Status: DC
  Administered 2019-05-29 – 2019-06-01 (×4): 81 mg via ORAL
  Filled 2019-05-29 (×4): qty 1

## 2019-05-29 MED ORDER — ONDANSETRON HCL 4 MG/2ML IJ SOLN: 4.0000 mg | Freq: Three times a day (TID) | INTRAMUSCULAR | Status: DC | PRN

## 2019-05-29 MED ORDER — ALBUMIN HUMAN 25 % IV SOLN
25.0000 g | Freq: Once | INTRAVENOUS | Status: AC
  Administered 2019-05-29: 25 g via INTRAVENOUS
  Filled 2019-05-29: qty 100

## 2019-05-29 MED ORDER — SODIUM CHLORIDE 0.9 % IV SOLN: INTRAVENOUS | Status: DC

## 2019-05-29 MED ORDER — PRAVASTATIN SODIUM 20 MG PO TABS
20.0000 mg | ORAL_TABLET | Freq: Every day | ORAL | Status: DC
  Administered 2019-05-30 – 2019-05-31 (×2): 20 mg via ORAL
  Filled 2019-05-29 (×2): qty 1

## 2019-05-29 MED ORDER — PANTOPRAZOLE SODIUM 40 MG PO TBEC
40.0000 mg | DELAYED_RELEASE_TABLET | Freq: Every day | ORAL | Status: DC
  Administered 2019-05-29 – 2019-06-01 (×4): 40 mg via ORAL
  Filled 2019-05-29 (×4): qty 1

## 2019-05-29 MED ORDER — SODIUM CHLORIDE 0.9 % IV SOLN: 1.0000 g | INTRAVENOUS | Status: DC

## 2019-05-29 MED ORDER — LACTATED RINGERS IV BOLUS
500.0000 mL | Freq: Once | INTRAVENOUS | Status: AC
  Administered 2019-05-29: 500 mL via INTRAVENOUS

## 2019-05-29 NOTE — ED Provider Notes (Signed)
[facility] Emergency Department Provider Note  ____________________________________________  Time seen: Approximately 5:59 AM  I have reviewed the triage vital signs and the nursing notes.   HISTORY  Chief Complaint No chief complaint on file.   HPI Shannon Sawyer is a 74 y.o. female with a history of Karlene Lineman cirrhosis, chronic cough, CAD, hypertension, anemia, thrombocytopenia who presents from home for generalized weakness.  Patient reports 2 weeks of progressively worsening generalized weakness.  This morning patient was unable to stand up and ambulate which prompted her husband to call 911.  She has had decreased appetite, decreased oral intake.  She does have a chronic cough and is complaining of shortness of breath which she has had for a long time.  No fever or chills, no chest pain, no abdominal pain.  She has been having several daily bowel movements and continues to take all her medications.  She denies constipation, nausea or vomiting.  She has received both Covid shots.  She denies any known exposures to Covid.   Patient went to see her primary care doctor 4 days ago for the symptoms and had labs showing mildly worsening creatinine and hyponatremia. She was started on ensure.  Continues to have nausea and decreased appetite.  Past Medical History:  Diagnosis Date  . Allergy   . Anemia    LOW PLATELET COUNTS  ([facility])  . Anxiety   . Carotid artery disease (HCC)    BILAT.        Marland Kitchen Chronic cough   . Cirrhosis, nonalcoholic (HCC)    PORTAL HTN, FATTY TISSUE   . Coronary artery disease   . DDD (degenerative disc disease), cervical    SPONDYLOLISTHESIS  . Depression   . GERD (gastroesophageal reflux disease)   . Headache    HX OCCULAR MIGRAINES  . Hypertension    ESSENTIAL  . Myocardial infarction ([place])   . Shortness of breath dyspnea    WITH EXERTION   . Tricuspid insufficiency    MODERATE     Patient Active Problem List    Diagnosis Date Noted  . Seasonal allergic rhinitis due to pollen 05/07/2018  . Thrombocytopenia ([place]) 02/03/2018  . H/O acute myocardial infarction 07/11/2017  . GERD (gastroesophageal reflux disease) 07/11/2017  . Bradycardia 11/06/2016  . Severe anxiety with panic 11/15/2015  . Memory change 06/30/2015  . H/O acute pancreatitis 11/18/2014  . Cervical radiculopathy 06/03/2014  . TI (tricuspid incompetence) 02/12/2014  . Carotid artery narrowing 01/12/2014  . Cirrhosis of liver not due to alcohol ([place]) 01/12/2014  . Anemia, iron deficiency 01/12/2014  . Breathlessness on exertion 01/12/2014  . Bilateral carotid artery stenosis 01/12/2014  . Cervical radiculitis 11/04/2013  . DDD (degenerative disc disease), cervical 11/04/2013  . SPL (spondylolisthesis) 11/04/2013  . Arteriosclerosis of coronary artery 05/20/2013  . Essential (primary) hypertension 05/20/2013  . Combined fat and carbohydrate induced hyperlipemia 05/20/2013  . NSTEMI (non-ST elevated myocardial infarction) ([place]) 12/04/2011  . Hyperglycemia 12/04/2011    Past Surgical History:  Procedure Laterality Date  . ABDOMINAL HYSTERECTOMY    . ANTERIOR CERVICAL DECOMP/DISCECTOMY FUSION N/A 06/03/2014   Procedure: CERVICAL FIVE-CERVICAL SIX, CERVICAL SIX-CERVICAL SEVEN ANTERIOR CERVICAL DECOMPRESSION/DISCECTOMY ;  Surgeon: Karie Chimera, MD;  Location: [place] [facility];  Service: Neurosurgery;  Laterality: N/A;  . BREAST BIOPSY Left 09/02/2015   benign  . BREAST CYST ASPIRATION Left    neg  . CARDIAC CATHETERIZATION     2 STENTS 2013 IN [place]  .  cardiac stents    . ESOPHAGOGASTRODUODENOSCOPY (EGD) WITH PROPOFOL N/A 02/21/2015   Procedure: ESOPHAGOGASTRODUODENOSCOPY (EGD) WITH PROPOFOL;  Surgeon: Josefine Class, MD;  [facility] END[facility];  Service: Endoscopy;  Laterality: N/A;  . EYE SURGERY      Prior to Admission medications   Medication Sig Start Date End Date Taking? Authorizing Provider  acetaminophen  (TYLENOL) 500 MG tablet Take 500 mg by mouth every 6 (six) hours as needed.     [provider]  aspirin EC 81 MG tablet Take 81 mg by mouth daily.  12/08/11   [provider]  cetirizine (ZYRTEC) 10 MG tablet Take 10 mg by mouth daily.    [provider]  lactulose (CHRONULAC) 10 GM/15ML solution Take 20 g by mouth 2 (two) times daily.    [provider]  LORazepam (ATIVAN) 0.5 MG tablet Take 0.63m PO once 30 minutes prior to procedure. May repeat dose x1 if needed before or during procedure. Patient not taking: Reported on 02/12/2019 01/21/19   BVirginia Crews MD  losartan (COZAAR) 25 MG tablet Take 25 mg by mouth daily.    [provider]  lovastatin (MEVACOR) 20 MG tablet TAKE 1 TABLET BY MOUTH EVERY DAY WITH DINNER 09/15/18   [provider]  nitroGLYCERIN (NITROSTAT) 0.4 MG SL tablet Place 0.4 mg under the tongue every 5 (five) minutes as needed for chest pain.     [provider]  omeprazole (PRILOSEC) 20 MG capsule Take 20 mg by mouth daily.     [provider]  rifaximin (XIFAXAN) 200 MG tablet Take 400 mg by mouth daily.    [provider]  triamcinolone cream (KENALOG) 0.1 % APPLY ON THE SKIN TWICE A DAY AS NEEDED ITCHING. AVOID FACE, GROIN, UNDERARMS 08/13/17   [provider]  Zinc Sulfate (ZINC 15 PO) Take by mouth daily. Unsure of dose    [provider]    Allergies Contrast media  [iodinated diagnostic agents], Azelastine, Flonase [fluticasone propionate], Prednisone, Shellfish allergy, and Tramadol  Family History  Problem Relation Age of Onset  . Liver cancer Mother   . Lymphoma Father   . COPD Father   . COPD Brother   . Liver cancer Son   . Breast cancer Neg Hx   . Colon cancer Neg Hx     Social History Social History   Tobacco Use  . Smoking status: Former Smoker    Types: Cigarettes  . Smokeless tobacco: Never Used  . Tobacco comment: formerly tried smoking  cigarettes, no regular use  Substance Use Topics  . Alcohol use: No  . Drug use: No    Review of Systems  Constitutional: Negative for fever. + generalized weakness Eyes: Negative for visual changes. ENT: Negative for sore throat. Neck: No neck pain  Cardiovascular: Negative for chest pain. Respiratory: +r shortness of breath, cough Gastrointestinal: Negative for abdominal pain, vomiting or diarrhea. Genitourinary: Negative for dysuria. Musculoskeletal: Negative for back pain. Skin: Negative for rash. Neurological: Negative for headaches, weakness or numbness. Psych: No SI or HI  ____________________________________________   PHYSICAL EXAM:  VITAL SIGNS: ED Triage Vitals  Enc Vitals Group     BP --      Pulse Rate 05/29/19 0554 95     Resp 05/29/19 0554 19     Temp 05/29/19 0554 97.8 F (36.6 C)     Temp Source 05/29/19 0554 Oral     SpO2 05/29/19 0554 100 %     Weight  05/29/19 0555 160 lb (72.6 kg)     Height 05/29/19 0555 5' 2"  (1.575 m)     Head Circumference --      Peak Flow --      Pain Score 05/29/19 0555 0     Pain Loc --      Pain Edu? --      Excl. in [place]? --     Constitutional: Alert and oriented, no distress.  HEENT:      Head: Normocephalic and atraumatic.         Eyes: Conjunctivae are normal. Sclera is icteric.       Mouth/Throat: Mucous membranes are moist.       Neck: Supple with no signs of meningismus. Cardiovascular: Regular rate and rhythm. No murmurs, gallops, or rubs. 2+ symmetrical distal pulses are present in all extremities.  Respiratory: Normal respiratory effort. Lungs are clear to auscultation bilaterally. No wheezes, crackles, or rhonchi.  Gastrointestinal: Soft, non tender, mildly distended with positive bowel sounds. No rebound or guarding. Musculoskeletal:  No edema, cyanosis, or erythema of extremities. Neurologic: Normal speech and language. Face is symmetric. Moving all extremities. No gross focal neurologic deficits are  appreciated. Skin: Skin is warm, dry and intact. No rash noted. Psychiatric: Mood and affect are normal. Speech and behavior are normal.  ____________________________________________   LABS (all labs ordered are listed, but only abnormal results are displayed)  Labs Reviewed  RESPIRATORY PANEL BY RT PCR (FLU A&B, COVID)  CBC WITH DIFFERENTIAL/PLATELET  COMPREHENSIVE METABOLIC PANEL  AMMONIA  URINALYSIS, COMPLETE (UACMP) WITH MICROSCOPIC  MAGNESIUM  PROTIME-INR  POC SARS CORONAVIRUS 2 AG  POC SARS CORONAVIRUS 2 AG -  ED   ____________________________________________  EKG  ED ECG REPORT I, Rudene Re, the attending physician, personally viewed and interpreted this ECG.  Normal sinus rhythm, rate of 94, borderline prolonged QTC, normal axis, low voltage QRS, no ST elevations or depressions.  Prolonged QTC is new when compared to prior. ____________________________________________  RADIOLOGY  I have personally reviewed the images performed during this visit and I agree with the Radiologist's read.   Interpretation by Radiologist:  DG Chest Portable 1 View  Result Date: 05/29/2019 CLINICAL DATA:  Shortness of breath EXAM: PORTABLE CHEST 1 VIEW COMPARISON:  September 13, 2014 FINDINGS: The heart size and mediastinal contours are within normal limits. Both lungs are clear. Cervical fixation hardware seen. IMPRESSION: No active disease. Electronically Signed   By: Prudencio Pair M.D.   On: 05/29/2019 06:28      ____________________________________________   PROCEDURES  Procedure(s) performed: None Procedures Critical Care performed:  None ____________________________________________   INITIAL IMPRESSION / ASSESSMENT AND PLAN / ED COURSE   74 y.o. female with a history of Karlene Lineman cirrhosis, chronic cough, CAD, hypertension, anemia, thrombocytopenia who presents from home for generalized weakness, cough, SOB x 2 weeks.  Recent labs done 4 days ago showing acute on chronic  kidney injury and hyponatremia.  Patient started on Ensure.  This morning unable to stand up due to generalized weakness.  She has a mild cough but no respiratory distress, lungs are clear to auscultation and she is satting 100% on room air, she looks euvolemic, mildly distended abdomen with no tenderness.  Differential diagnosis including hepatic encephalopathy versus sepsis versus significant electrolyte abnormalities versus severe dehydration versus UTI versus pneumonia versus Covid versus flu.  We will check labs, chest x-ray, EKG.  Will place patient on telemetry to rule out any signs of dysrhythmias or hypoxia.   _________________________  6:55 AM on 05/29/2019 -----------------------------------------  Rapid COVID negative. PCR test pending. CXR negative. Labs pending. Care transferred to Dr. Charna Archer  I have reviewed patient's previous medical records and PMH.      _____________________________________________ Please note:  Patient was evaluated in Emergency Department today for the symptoms described in the history of present illness. Patient was evaluated in the context of the global COVID-19 pandemic, which necessitated consideration that the patient might be at risk for infection with the SARS-CoV-2 virus that causes COVID-19. Institutional protocols and algorithms that pertain to the evaluation of patients at risk for COVID-19 are in a state of rapid change based on information released by regulatory bodies including the CDC and federal and state organizations. These policies and algorithms were followed during the patient's care in the ED.  Some ED evaluations and interventions may be delayed as a result of limited staffing during the pandemic.   ____________________________________________   FINAL CLINICAL IMPRESSION(S) / ED DIAGNOSES   Final diagnoses:  Generalized weakness      NEW MEDICATIONS STARTED DURING THIS VISIT:  ED Discharge Orders    None       Note:   This document was prepared using Dragon voice recognition software and may include unintentional dictation errors.    Alfred Levins, [place], MD 05/29/19 2322

## 2019-05-29 NOTE — ED Notes (Signed)
Attempted for cultures at R ac without success.

## 2019-05-29 NOTE — ED Notes (Signed)
Phlebotomy at bedside.

## 2019-05-29 NOTE — ED Notes (Signed)
Phlebotomy collected full 2nd set of cultures at R ac area with butterfly.

## 2019-05-29 NOTE — ED Notes (Signed)
Brett NT provided pericare and changed bed pads.

## 2019-05-29 NOTE — ED Notes (Signed)
Family remains at bedside with pt. Updated on plan.

## 2019-05-29 NOTE — ED Notes (Addendum)
Pt alert and laying calmly in bed. Pt denies HA, dizziness, nausea, or pain. Husband at bedside. Bed locked low. Rails up. Call bell within reach. LR bolus just finished. Pt's husband given food tray as he states he hasn't eaten all morning. Pt remains NPO.

## 2019-05-29 NOTE — Progress Notes (Signed)
Secure chat sent to [place], [place] regarding fact that IV team will be unable to be at bedside for 1-2 hours due to placing PICC on different patient for discharge home.  Please consider other options for rocephin administration including giving IM or after albumin is completed.

## 2019-05-29 NOTE — ED Provider Notes (Signed)
-----------------------------------------   7:09 AM on 05/29/2019 -----------------------------------------  Pulse 95, temperature 97.8 F (36.6 C), temperature source Oral, resp. rate 19, height 5' 2"  (1.575 m), weight 72.6 kg, SpO2 100 %.  Assuming care from Dr. Alfred Levins.  In short, Shannon Sawyer is a 74 y.o. female with a chief complaint of No chief complaint on file. Marland Kitchen  Refer to the original H&P for additional details.  The current plan of care is to follow-up labs for increasing weakness in patient with NASH cirrhosis.  ----------------------------------------- 10:34 AM on 05/29/2019 -----------------------------------------  Work-up is significant for AKI, electrolytes unremarkable.  We will hydrate with small bolus of IV fluids.  Infectious work-up thus far unremarkable, chest x-ray negative for acute process.  UA is pending and patient may also benefit from paracentesis to rule out SBP, although she has no abdominal tenderness or fever at this time.  Case discussed with hospitalist, who accepts patient for admission.    Blake Divine, MD 05/29/19 1034

## 2019-05-29 NOTE — ED Notes (Signed)
Called lab. State 2nd phlebotomist on the way to help collect blood cultures. [place] called this RN to notify not enough fluid to complete paracentesis so they did not complete it. Provider notified.

## 2019-05-29 NOTE — ED Notes (Signed)
Pt's husband signed consent for pt to receive albumin as pt's hand coordination/weakness is preventing her from signing. Pt verbal agreement witnessed by this RN as well. Pharm confirms albumin to be given via primary line. Phlebotomy staff at bedside.

## 2019-05-29 NOTE — ED Notes (Signed)
Still unable to collect cultures. Pt hard stick. Lab called in request of assistance. [place] may be necessary.

## 2019-05-29 NOTE — ED Triage Notes (Signed)
Patient coming in for increased weakness, saw PCP on Monday and had elevated Creatatin and decreases blood proteins. She was told to drink ensurses but she has a decreases appetite. Patient unable to walk or stand for EMS. Patient is baseline is to use a walker or cane. Patient has a hx of cirrohosis.  142/90 for EMS and HR of 78 100% on RA.

## 2019-05-29 NOTE — Plan of Care (Signed)
Continue with plan of care.  

## 2019-05-29 NOTE — ED Notes (Signed)
Pt denies s/s of reaction to albumin. In NAD currently.

## 2019-05-29 NOTE — ED Notes (Addendum)
Rocephin to be given once 2nd phlebotomist done collecting cultures. Lab states 2nd phlebotomist will be available at 2pm. Pt leaving with [place] staff now. Family remains at bedside in ED. Provider Blaine Hamper notified of hold up on cultures and starting rocephin. No new orders.

## 2019-05-29 NOTE — ED Notes (Signed)
Pt assisted onto bedpan by this RN.

## 2019-05-29 NOTE — H&P (Signed)
History and Physical    Shannon Sawyer GXQ:[id number] DOB: 01-01-1946 DOA: 05/29/2019  Referring MD/NP/PA:   PCP: Virginia Crews, MD   Patient coming from:  The patient is coming from home.  At baseline, pt is independent for most of ADL.        Chief Complaint: Generalized weakness  HPI: Shannon Sawyer is a 74 y.o. female with medical history significant of hypertension, hyperlipidemia, GERD, depression with anxiety, tricuspid valve insufficiency, CAD, anemia, carotid artery stenosis, liver cirrhosis-NASH, CKD stage III, thrombocytopenia, pancreatitis, who presents with generalized weakness.  Patient states that she has been having generalized weakness in the past several days, which has worsened today.  She normally uses walker to walk around, but has difficulty walking now.  No unilateral numbness or tingling to extremities.  No facial droop or slurred speech.  Patient states he has vomited once earlier, currently no nausea vomiting, abdominal pain.  She has abdominal distention. She has loose stool bowel movement since she is taking lactulose.  Patient has a mild cough, but no chest pain or shortness breath.  No symptoms of UTI.  No fever or chills.    ED Course: pt was found to have WBC 15.3, lipase 77, negative Covid PCR, ammonia level 26, INR 1.3, worsening renal function, liver function (ALP 158, AST 61, ALT 71, total bilirubin 2.9), temperature normal, oxygen saturation 100% on room air, RR 19, heart rate 95, soft blood pressure, chest x-ray negative.  Patient is admitted to [place] bed as inpatient.  Review of Systems:   General: no fevers, chills, no body weight gain, has poor appetite, has fatigue HEENT: no blurry vision, hearing changes or sore throat Respiratory: no dyspnea, has coughing, no wheezing CV: no chest pain, no palpitations GI: has nausea, vomiting, no abdominal pain, constipation.  Has abdominal distention GU: no dysuria, burning on urination, increased urinary  frequency, hematuria  Ext: no leg edema Neuro: no unilateral weakness, numbness, or tingling, no vision change or hearing loss Skin: no rash, no skin tear. MSK: No muscle spasm, no deformity, no limitation of range of movement in spin Heme: No easy bruising.  Travel history: No recent long distant travel.  Allergy:  Allergies  Allergen Reactions  . Contrast Media  [Iodinated Diagnostic Agents] Rash    Blister rash   . Azelastine Swelling  . Flonase [Fluticasone Propionate] Swelling  . Prednisone Swelling  . Shellfish Allergy Swelling and Other (See Comments)    Per spouse, determined on allergy test.  . Tramadol Nausea Only    Past Medical History:  Diagnosis Date  . Allergy   . Anemia    LOW PLATELET COUNTS  ([facility])  . Anxiety   . Carotid artery disease (HCC)    BILAT.        Marland Kitchen Chronic cough   . Cirrhosis, nonalcoholic (HCC)    PORTAL HTN, FATTY TISSUE   . Coronary artery disease   . DDD (degenerative disc disease), cervical    SPONDYLOLISTHESIS  . Depression   . GERD (gastroesophageal reflux disease)   . Headache    HX OCCULAR MIGRAINES  . Hypertension    ESSENTIAL  . Myocardial infarction ([place])   . Shortness of breath dyspnea    WITH EXERTION   . Tricuspid insufficiency    MODERATE     Past Surgical History:  Procedure Laterality Date  . ABDOMINAL HYSTERECTOMY    . ANTERIOR CERVICAL DECOMP/DISCECTOMY FUSION N/A 06/03/2014   Procedure: CERVICAL  FIVE-CERVICAL SIX, CERVICAL SIX-CERVICAL SEVEN ANTERIOR CERVICAL DECOMPRESSION/DISCECTOMY ;  Surgeon: Karie Chimera, MD;  Location: [facility];  Service: Neurosurgery;  Laterality: N/A;  . BREAST BIOPSY Left 09/02/2015   benign  . BREAST CYST ASPIRATION Left    neg  . CARDIAC CATHETERIZATION     2 STENTS 2013 IN [place]  . cardiac stents    . ESOPHAGOGASTRODUODENOSCOPY (EGD) WITH PROPOFOL N/A 02/21/2015   Procedure: ESOPHAGOGASTRODUODENOSCOPY (EGD) WITH PROPOFOL;  Surgeon: Josefine Class, MD;  Location: [facility];  Service: Endoscopy;  Laterality: N/A;  . EYE SURGERY      Social History:  reports that she has quit smoking. Her smoking use included cigarettes. She has never used smokeless tobacco. She reports that she does not drink alcohol or use drugs.  Family History:  Family History  Problem Relation Age of Onset  . Liver cancer Mother   . Lymphoma Father   . COPD Father   . COPD Brother   . Liver cancer Son   . Breast cancer Neg Hx   . Colon cancer Neg Hx      Prior to Admission medications   Medication Sig Start Date End Date Taking? Authorizing Provider  acetaminophen (TYLENOL) 500 MG tablet Take 500 mg by mouth every 6 (six) hours as needed.     [provider]  aspirin EC 81 MG tablet Take 81 mg by mouth daily.  12/08/11   [provider]  cetirizine (ZYRTEC) 10 MG tablet Take 10 mg by mouth daily.    [provider]  lactulose (CHRONULAC) 10 GM/15ML solution Take 20 g by mouth 2 (two) times daily.    [provider]  LORazepam (ATIVAN) 0.5 MG tablet Take 0.7m PO once 30 minutes prior to procedure. May repeat dose x1 if needed before or during procedure. Patient not taking: Reported on 02/12/2019 01/21/19   BVirginia Crews MD  losartan (COZAAR) 25 MG tablet Take 25 mg by mouth daily.    [provider]  lovastatin (MEVACOR) 20 MG tablet TAKE 1 TABLET BY MOUTH EVERY DAY WITH DINNER 09/15/18   [provider]  nitroGLYCERIN (NITROSTAT) 0.4 MG SL tablet Place 0.4 mg under the tongue every 5 (five) minutes as needed for chest pain.     [provider]  omeprazole (PRILOSEC) 20 MG capsule Take 20 mg by mouth daily.     [provider]  rifaximin (XIFAXAN) 200 MG tablet Take 400 mg by mouth daily.    [provider]  triamcinolone cream (KENALOG) 0.1 % APPLY ON THE SKIN TWICE A DAY AS NEEDED ITCHING. AVOID FACE, GROIN, UNDERARMS 08/13/17   [provider]  Zinc  Sulfate (ZINC 15 PO) Take by mouth daily. Unsure of dose    [provider]    Physical Exam: Vitals:   05/29/19 1628 05/29/19 1630 05/29/19 1645 05/29/19 1700  BP:      Pulse:  97 (!) 104 (!) 108  Resp:  19 16 18   Temp: 97.6 F (36.4 C)     TempSrc: Oral     SpO2:  99% 100% 100%  Weight:      Height:       General: Not in acute distress HEENT:       Eyes: PERRL, EOMI, no scleral icterus.       ENT: No discharge from the ears and nose, no pharynx injection, no tonsillar enlargement.        Neck: No JVD, no bruit, no  mass felt. Heme: No neck lymph node enlargement. Cardiac: S1/S2, RRR, No gallops or rubs. Respiratory:  No rales, wheezing, rhonchi or rubs. GI: Soft, distended, nontender, no rebound pain, no organomegaly, BS present. GU: No hematuria Ext: No pitting leg edema bilaterally. 2+DP/PT pulse bilaterally. Musculoskeletal: No joint deformities, No joint redness or warmth, no limitation of ROM in spin. Skin: No rashes.  Neuro: Alert, oriented X3, cranial nerves II-XII grossly intact, moves all extremities normally. Psych: Patient is not psychotic, no suicidal or hemocidal ideation.  Labs on Admission: I have personally reviewed following labs and imaging studies  CBC: Recent Labs  Lab 05/29/19 0805  WBC 15.3*  NEUTROABS 9.9*  HGB 11.4*  HCT 31.5*  MCV 93.8  PLT 734*   Basic Metabolic Panel: Recent Labs  Lab 05/29/19 0805  NA 130*  K 3.9  CL 99  CO2 19*  GLUCOSE 103*  BUN 40*  CREATININE 2.27*  CALCIUM 9.5  MG 1.7   GFR: Estimated Creatinine Clearance: 20.3 mL/min (A) (by C-G formula based on SCr of 2.27 mg/dL (H)). Liver Function Tests: Recent Labs  Lab 05/29/19 0805  AST 61*  ALT 31  ALKPHOS 158*  BILITOT 2.9*  PROT 6.4*  ALBUMIN 2.7*   Recent Labs  Lab 05/29/19 0805  LIPASE 77*   Recent Labs  Lab 05/29/19 0603  AMMONIA 26   Coagulation Profile: Recent Labs  Lab 05/29/19 0805  INR 1.3*   Cardiac Enzymes: No  results for input(s): CKTOTAL, CKMB, CKMBINDEX, TROPONINI in the last 168 hours. BNP (last 3 results) No results for input(s): PROBNP in the last 8760 hours. HbA1C: No results for input(s): HGBA1C in the last 72 hours. CBG: No results for input(s): GLUCAP in the last 168 hours. Lipid Profile: No results for input(s): CHOL, HDL, LDLCALC, TRIG, CHOLHDL, LDLDIRECT in the last 72 hours. Thyroid Function Tests: No results for input(s): TSH, T4TOTAL, FREET4, T3FREE, THYROIDAB in the last 72 hours. Anemia Panel: No results for input(s): VITAMINB12, FOLATE, FERRITIN, TIBC, IRON, RETICCTPCT in the last 72 hours. Urine analysis:    Component Value Date/Time   COLORURINE YELLOW (A) 05/29/2019 0603   APPEARANCEUR HAZY (A) 05/29/2019 0603   LABSPEC 1.011 05/29/2019 0603   PHURINE 5.0 05/29/2019 0603   GLUCOSEU NEGATIVE 05/29/2019 0603   HGBUR NEGATIVE 05/29/2019 0603   BILIRUBINUR NEGATIVE 05/29/2019 0603   KETONESUR NEGATIVE 05/29/2019 0603   PROTEINUR NEGATIVE 05/29/2019 0603   NITRITE NEGATIVE 05/29/2019 0603   LEUKOCYTESUR NEGATIVE 05/29/2019 0603   Sepsis Labs: @LABRCNTIP (procalcitonin:4,lacticidven:4) ) Recent Results (from the past 240 hour(s))  Respiratory Panel by RT PCR (Flu A&B, Covid) - Nasopharyngeal Swab     Status: None   Collection Time: 05/29/19  7:39 AM   Specimen: Nasopharyngeal Swab  Result Value Ref Range Status   SARS Coronavirus 2 by RT PCR NEGATIVE NEGATIVE Final    Comment: (NOTE) SARS-CoV-2 target nucleic acids are NOT DETECTED. The SARS-CoV-2 RNA is generally detectable in upper respiratoy specimens during the acute phase of infection. The lowest concentration of SARS-CoV-2 viral copies this assay can detect is 131 copies/mL. A negative result does not preclude SARS-Cov-2 infection and should not be used as the sole basis for treatment or other patient management decisions. A negative result may occur with  improper specimen collection/handling, submission  of specimen other than nasopharyngeal swab, presence of viral mutation(s) within the areas targeted by this assay, and inadequate number of viral copies (<131 copies/mL). A negative result must be combined with clinical observations,  patient history, and epidemiological information. The expected result is Negative. Fact Sheet for Patients:  PinkCheek.be Fact Sheet for Healthcare Providers:  GravelBags.it This test is not yet ap proved or cleared by the [place] FDA and  has been authorized for detection and/or diagnosis of SARS-CoV-2 by FDA under an Emergency Use Authorization (EUA). This EUA will remain  in effect (meaning this test can be used) for the duration of the COVID-19 declaration under Section 564(b)(1) of the Act, 21 U.S.C. section 360bbb-3(b)(1), unless the authorization is terminated or revoked sooner.    Influenza A by PCR NEGATIVE NEGATIVE Final   Influenza B by PCR NEGATIVE NEGATIVE Final    Comment: (NOTE) The Xpert Xpress SARS-CoV-2/FLU/RSV assay is intended as an aid in  the diagnosis of influenza from Nasopharyngeal swab specimens and  should not be used as a sole basis for treatment. Nasal washings and  aspirates are unacceptable for Xpert Xpress SARS-CoV-2/FLU/RSV  testing. Fact Sheet for Patients: PinkCheek.be Fact Sheet for Healthcare Providers: GravelBags.it This test is not yet approved or cleared by the [place] FDA and  has been authorized for detection and/or diagnosis of SARS-CoV-2 by  FDA under an Emergency Use Authorization (EUA). This EUA will remain  in effect (meaning this test can be used) for the duration of the  Covid-19 declaration under Section 564(b)(1) of the Act, 21  U.S.C. section 360bbb-3(b)(1), unless the authorization is  terminated or revoked. Performed at [facility], [address]  [place], [place] [address]      Radiological Exams on Admission: US Abdomen Limited  Result Date: 05/29/2019 CLINICAL DATA:  Ascites. EXAM: ULTRASOUND ABDOMEN LIMITED COMPARISON:  CT 09/14/2014. FINDINGS: Mild ascites noted. No large pocket of fluid was noted for safe paracentesis. Paracentesis not performed. Follow-up exam can be obtained as needed. IMPRESSION: Mild ascites. No large pocket of fluid was noted for safe paracentesis. Paracentesis not performed. Electronically Signed   By: Marcello Moores  Register   On: 05/29/2019 14:05   DG Chest Portable 1 View  Result Date: 05/29/2019 CLINICAL DATA:  Shortness of breath EXAM: PORTABLE CHEST 1 VIEW COMPARISON:  September 13, 2014 FINDINGS: The heart size and mediastinal contours are within normal limits. Both lungs are clear. Cervical fixation hardware seen. IMPRESSION: No active disease. Electronically Signed   By: Prudencio Pair M.D.   On: 05/29/2019 06:28     EKG: Independently reviewed.  Sinus rhythm, QTC 496, low voltage, nonspecific T wave change  Assessment/Plan Principal Problem:   Acute renal failure superimposed on stage 3a chronic kidney disease (HCC) Active Problems:   Cirrhosis of liver not due to alcohol (HCC)   Essential (primary) hypertension   Anemia, iron deficiency   GERD (gastroesophageal reflux disease)   Thrombocytopenia (HCC)   HLD (hyperlipidemia)   CAD (coronary artery disease)   Generalized weakness   Hyponatremia   Acute renal failure superimposed on stage 3a chronic kidney disease ([place]): Baseline Cre is 1.1, pt's Cre is 2.27 and BUN 40 on admission. 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  Ammonia level 26.  No hepatic encephalopathy. pt has abdominal distention.  She has leukocytosis with WBC 15.3.  Suspecting SBP.  Initially consulted IR, Dr. Earleen Newport and ordered paracentesis, but abdominal ultrasound showed mild ascites without large pocket for paracentesis. -Continue home lactulose -Started Rocephin empirically -Give 25 g of albumin by IV -Blood culture  HTN: Blood pressures are soft, 93/42 -hold home medications: Nadolol -Hold Lasix, Cozaar, spironolactone due to worsening renal function  Anemia, iron deficiency: Hgb 11.4 -f/u by CBC  GERD (gastroesophageal reflux disease) -Protonix  Thrombocytopenia ([place]): Platelet 121.  Most likely due to liver cirrhosis. -Follow-up with CBC  HLD (hyperlipidemia) -Pravastatin  CAD (coronary artery disease): no CP -continue ASA, statin, and prn NTG  Generalized weakness: Likely multifactorial etiology -pt/ot  Hyponatremia: Na 130. - Will check urine sodium, urine osmolality, serum osmolality. - IVF: as above - f/u by BMP - avoid over correction too fast due to risk of central pontine myelinolysis - hold diuretics and cozarr  Inpatient status:  # Patient requires inpatient status due to high intensity of service, high risk for further deterioration and high frequency of surveillance required.  I certify that at the point of admission it is my clinical judgment that the patient will require inpatient hospital care spanning beyond 2 midnights from the point of admission.  . This patient has multiple chronic comorbidities including hypertension, hyperlipidemia, GERD, depression with anxiety, tricuspid valve insufficiency, CAD, anemia, carotid artery stenosis, liver cirrhosis-NASH, CKD stage III, thrombocytopenia, pancreatitis .  Marland Kitchen Now patient has presenting with AoCKD-III, hyponatremia, generalized weakness, suspected SBP . The worrisome physical exam findings include abdominal distention . The initial  radiographic and laboratory data are worrisome because of leukocytosis, worsening renal function, hyponatremia . Current medical needs: please see my assessment and plan . Predictability of an adverse outcome (risk): Patient has multiple comorbidities as listed above. Now presents with  AoCKD-III, hyponatremia, generalized weakness, suspected SBP. Patient's presentation is highly complicated.  Patient is at high risk of deteriorating.  Will need to be treated in hospital for at least 2 days.       DVT ppx: SCD Code Status: Full code Family Communication: Yes, patient's husband at bed side Disposition Plan:  Anticipate discharge back to previous home environment Consults called:  Message sent to Dr. Earleen Newport for consult Admission status: Med-surg bed as inpt       Date of Service 05/29/2019    [facility]   If 7PM-7AM, please contact night-coverage [email or web address] 05/29/2019, 5:32 PM

## 2019-05-29 NOTE — ED Notes (Signed)
This RN attempted blood draw twice without success. IV team consult placed.

## 2019-05-29 NOTE — ED Notes (Signed)
This patient will need labs along with an IV. Patient has a 22 in Left wrist but does not pull back and barely flushes and was put in by EMS

## 2019-05-30 DIAGNOSIS — D509 Iron deficiency anemia, unspecified: Secondary | ICD-10-CM

## 2019-05-30 LAB — CBC WITH DIFFERENTIAL/PLATELET
Abs Immature Granulocytes: 0.07 10*3/uL (ref 0.00–0.07)
Basophils Absolute: 0 10*3/uL (ref 0.0–0.1)
Basophils Relative: 0 %
Eosinophils Absolute: 0.6 10*3/uL — ABNORMAL HIGH (ref 0.0–0.5)
Eosinophils Relative: 6 %
HCT: 23.8 % — ABNORMAL LOW (ref 36.0–46.0)
Hemoglobin: 8.7 g/dL — ABNORMAL LOW (ref 12.0–15.0)
Immature Granulocytes: 1 %
Lymphocytes Relative: 23 %
Lymphs Abs: 2.2 10*3/uL (ref 0.7–4.0)
MCH: 34 pg (ref 26.0–34.0)
MCHC: 36.6 g/dL — ABNORMAL HIGH (ref 30.0–36.0)
MCV: 93 fL (ref 80.0–100.0)
Monocytes Absolute: 1.3 10*3/uL — ABNORMAL HIGH (ref 0.1–1.0)
Monocytes Relative: 13 %
Neutro Abs: 5.5 10*3/uL (ref 1.7–7.7)
Neutrophils Relative %: 57 %
Platelets: 87 10*3/uL — ABNORMAL LOW (ref 150–400)
RBC: 2.56 MIL/uL — ABNORMAL LOW (ref 3.87–5.11)
RDW: 15.9 % — ABNORMAL HIGH (ref 11.5–15.5)
WBC: 9.6 10*3/uL (ref 4.0–10.5)
nRBC: 0.2 % (ref 0.0–0.2)

## 2019-05-30 LAB — CBC
HCT: 23.8 % — ABNORMAL LOW (ref 36.0–46.0)
Hemoglobin: 8.6 g/dL — ABNORMAL LOW (ref 12.0–15.0)
MCH: 34.8 pg — ABNORMAL HIGH (ref 26.0–34.0)
MCHC: 36.1 g/dL — ABNORMAL HIGH (ref 30.0–36.0)
MCV: 96.4 fL (ref 80.0–100.0)
Platelets: 87 10*3/uL — ABNORMAL LOW (ref 150–400)
RBC: 2.47 MIL/uL — ABNORMAL LOW (ref 3.87–5.11)
RDW: 15.7 % — ABNORMAL HIGH (ref 11.5–15.5)
WBC: 9.4 10*3/uL (ref 4.0–10.5)
nRBC: 0 % (ref 0.0–0.2)

## 2019-05-30 LAB — COMPREHENSIVE METABOLIC PANEL
ALT: 24 U/L (ref 0–44)
AST: 51 U/L — ABNORMAL HIGH (ref 15–41)
Albumin: 2.5 g/dL — ABNORMAL LOW (ref 3.5–5.0)
Alkaline Phosphatase: 99 U/L (ref 38–126)
Anion gap: 9 (ref 5–15)
BUN: 36 mg/dL — ABNORMAL HIGH (ref 8–23)
CO2: 19 mmol/L — ABNORMAL LOW (ref 22–32)
Calcium: 8.8 mg/dL — ABNORMAL LOW (ref 8.9–10.3)
Chloride: 105 mmol/L (ref 98–111)
Creatinine, Ser: 1.79 mg/dL — ABNORMAL HIGH (ref 0.44–1.00)
GFR calc Af Amer: 32 mL/min — ABNORMAL LOW (ref >60)
GFR calc non Af Amer: 27 mL/min — ABNORMAL LOW (ref >60)
Glucose, Bld: 84 mg/dL (ref 70–99)
Potassium: 3.8 mmol/L (ref 3.5–5.1)
Sodium: 133 mmol/L — ABNORMAL LOW (ref 135–145)
Total Bilirubin: 2.1 mg/dL — ABNORMAL HIGH (ref 0.3–1.2)
Total Protein: 5.1 g/dL — ABNORMAL LOW (ref 6.5–8.1)

## 2019-05-30 LAB — FOLATE: Folate: 12.1 ng/mL (ref >5.9)

## 2019-05-30 LAB — IRON AND TIBC
Iron: 122 ug/dL (ref 28–170)
Saturation Ratios: 93 % — ABNORMAL HIGH (ref 10.4–31.8)
TIBC: 132 ug/dL — ABNORMAL LOW (ref 250–450)
UIBC: 10 ug/dL

## 2019-05-30 LAB — FERRITIN: Ferritin: 1043 ng/mL — ABNORMAL HIGH (ref 11–307)

## 2019-05-30 LAB — OCCULT BLOOD X 1 CARD TO LAB, STOOL: Fecal Occult Bld: NEGATIVE

## 2019-05-30 LAB — GLUCOSE, CAPILLARY: Glucose-Capillary: 79 mg/dL (ref 70–99)

## 2019-05-30 LAB — VITAMIN B12: Vitamin B-12: 898 pg/mL (ref 180–914)

## 2019-05-30 NOTE — Progress Notes (Signed)
PROGRESS NOTE    Shannon Sawyer  ZOX:[id number] DOB: [birth year] DOA: 05/29/2019 PCP: Virginia Crews, MD   Brief Narrative:  HPI: Shannon Sawyer is a 74 y.o. female with medical history significant of hypertension, hyperlipidemia, GERD, depression with anxiety, tricuspid valve insufficiency, CAD, anemia, carotid artery stenosis, liver cirrhosis-NASH, CKD stage III, thrombocytopenia, pancreatitis, who presents with generalized weakness.  Patient states that she has been having generalized weakness in the past several days, which has worsened today.  She normally uses walker to walk around, but has difficulty walking now.  No unilateral numbness or tingling to extremities.  No facial droop or slurred speech.  Patient states he has vomited once earlier, currently no nausea vomiting, abdominal pain.  She has abdominal distention. She has loose stool bowel movement since she is taking lactulose.  Patient has a mild cough, but no chest pain or shortness breath.  No symptoms of UTI.  No fever or chills.    3/27: Patient seen and examined.  Reports symptomatic improvement since yesterday.  Per physical therapy evaluation patient still markedly weak.  Recommending home with home health.  Kidney function improved over interval.  On interview patient states that she has not had bowel movement for several days and had 2 loose bowel movements this morning.   Assessment & Plan:   Principal Problem:   Acute renal failure superimposed on stage 3a chronic kidney disease (HCC) Active Problems:   Cirrhosis of liver not due to alcohol (HCC)   Essential (primary) hypertension   Anemia, iron deficiency   GERD (gastroesophageal reflux disease)   Thrombocytopenia (HCC)   HLD (hyperlipidemia)   CAD (coronary artery disease)   Generalized weakness   Hyponatremia  Acute renal failure superimposed on stage 3a chronic kidney disease (Shannon Sawyer): Baseline Cre is 1.1, pt's Cre is 2.27 and BUN 40 on admission.  Likely  due to prerenal secondary to dehydration and continuation of ARB, diuretics Kidney function improving over interval Plan: Continue NS at 75cc/hr Avoid nephrotoxins Hold home Cozaar, lasix, aldactone for now Daily BMP Tentative plan for discharge on 05/31/2019 if kidney function normalized  Cirrhosis of liver not due to alcohol:   Mental status seems to be okay.  Ammonia level 26.   No hepatic encephalopathy. pt has abdominal distention.   She has leukocytosis with WBC 15.3.  Suspecting SBP.   Initially consulted IR, Dr. Earleen Newport and ordered paracentesis, but abdominal ultrasound showed mild ascites without large pocket for paracentesis. Per patient she had not had a bowel movement for 3 days leading up till today.  This may be contributing to some of her weakness Plan: -Continue home lactulose -Continue empiric Rocephin -Follow blood cultures, NGTD  Hyponatremia: Na 130. -Urine studies indicative of chronic hyponatremia secondary to liver disease - IVF: as above - f/u by BMP - avoid over correction too fast due to risk of central pontine myelinolysis - hold diuretics and cozaar  HTN Blood pressure soft on presentation Medications were held Plan: Hold Lasix, Cozaar, Aldactone on hold secondary to AKI Monitor blood pressure and kidney function, restart medications as appropriate  Anemia, iron deficiency: Hgb 11.4 -f/u by CBC  GERD (gastroesophageal reflux disease) -Protonix  Thrombocytopenia ([place]): Platelet 121.  Most likely due to liver cirrhosis. -Follow-up with CBC  HLD (hyperlipidemia) -Pravastatin  CAD (coronary artery disease): no CP -continue ASA, statin, and prn NTG  Generalized weakness: Likely multifactorial etiology -pt/ot   DVT prophylaxis: SCDs Code Status: Full Family Communication: Husband Shannon Sawyer via phone  [phone] on 05/30/2019 Disposition Plan: Anticipate home with home health.  Current barriers to discharge include persistent  weakness and abnormal kidney function.  Anticipate discharge home with home health on 05/31/2019 if kidney function normalizes  Consultants:   None  Procedures:   None  Antimicrobials:  Rocephin (05/29/2019-  )   Subjective: Patient seen and examined Energy level improved BM x2 noted this morning No pain complaints  Objective: Vitals:   05/29/19 1834 05/29/19 2118 05/30/19 0023 05/30/19 0511  BP: (!) 127/52 (!) 115/52 (!) 118/45 (!) 126/48  Pulse: 97 95 91 91  Resp: 16 17  18   Temp: 98.3 F (36.8 C) 98 F (36.7 C) 98.4 F (36.9 C) 98.4 F (36.9 C)  TempSrc: Oral Oral Oral Oral  SpO2: 100% 100% 99% 100%  Weight:      Height:        Intake/Output Summary (Last 24 hours) at 05/30/2019 1121 Last data filed at 05/30/2019 0800 Gross per 24 hour  Intake 1056.32 ml  Output 0 ml  Net 1056.32 ml   Filed Weights   05/29/19 0555  Weight: 72.6 kg    Examination:  General exam: Appears calm and comfortable  Respiratory system: Clear to auscultation. Respiratory effort normal. Cardiovascular system: S1 & S2 heard, RRR. No JVD, murmurs, rubs, gallops or clicks. No pedal edema. Gastrointestinal system: Soft, distended.  No pain.  Positive bowel sounds Central nervous system: Alert and oriented. No focal neurological deficits. Extremities: Symmetric 5 x 5 power. Skin: No rashes, lesions or ulcers Psychiatry: Judgement and insight appear normal. Mood & affect appropriate.     Data Reviewed: I have personally reviewed following labs and imaging studies  CBC: Recent Labs  Lab 05/29/19 0805 05/30/19 0538  WBC 15.3* 9.6  9.4  NEUTROABS 9.9* 5.5  HGB 11.4* 8.7*  8.6*  HCT 31.5* 23.8*  23.8*  MCV 93.8 93.0  96.4  PLT 121* 87*  87*   Basic Metabolic Panel: Recent Labs  Lab 05/29/19 0805 05/30/19 0538  NA 130* 133*  K 3.9 3.8  CL 99 105  CO2 19* 19*  GLUCOSE 103* 84  BUN 40* 36*  CREATININE 2.27* 1.79*  CALCIUM 9.5 8.8*  MG 1.7  --    GFR: Estimated  Creatinine Clearance: 25.7 mL/min (A) (by C-G formula based on SCr of 1.79 mg/dL (H)). Liver Function Tests: Recent Labs  Lab 05/29/19 0805 05/30/19 0538  AST 61* 51*  ALT 31 24  ALKPHOS 158* 99  BILITOT 2.9* 2.1*  PROT 6.4* 5.1*  ALBUMIN 2.7* 2.5*   Recent Labs  Lab 05/29/19 0805  LIPASE 77*   Recent Labs  Lab 05/29/19 0603  AMMONIA 26   Coagulation Profile: Recent Labs  Lab 05/29/19 0805  INR 1.3*   Cardiac Enzymes: No results for input(s): CKTOTAL, CKMB, CKMBINDEX, TROPONINI in the last 168 hours. BNP (last 3 results) No results for input(s): PROBNP in the last 8760 hours. HbA1C: No results for input(s): HGBA1C in the last 72 hours. CBG: Recent Labs  Lab 05/30/19 0735  GLUCAP 79   Lipid Profile: No results for input(s): CHOL, HDL, LDLCALC, TRIG, CHOLHDL, LDLDIRECT in the last 72 hours. Thyroid Function Tests: No results for input(s): TSH, T4TOTAL, FREET4, T3FREE, THYROIDAB in the last 72 hours. Anemia Panel: No results for input(s): VITAMINB12, FOLATE, FERRITIN, TIBC, IRON, RETICCTPCT in the last 72 hours. Sepsis Labs: No results for input(s): PROCALCITON, LATICACIDVEN in the last 168 hours.  Recent Results (from the past 240 hour(s))  Respiratory Panel by RT PCR (Flu A&B, Covid) - Nasopharyngeal Swab     Status: None   Collection Time: 05/29/19  7:39 AM   Specimen: Nasopharyngeal Swab  Result Value Ref Range Status   SARS Coronavirus 2 by RT PCR NEGATIVE NEGATIVE Final    Comment: (NOTE) SARS-CoV-2 target nucleic acids are NOT DETECTED. The SARS-CoV-2 RNA is generally detectable in upper respiratoy specimens during the acute phase of infection. The lowest concentration of SARS-CoV-2 viral copies this assay can detect is 131 copies/mL. A negative result does not preclude SARS-Cov-2 infection and should not be used as the sole basis for treatment or other patient management decisions. A negative result may occur with  improper specimen  collection/handling, submission of specimen other than nasopharyngeal swab, presence of viral mutation(s) within the areas targeted by this assay, and inadequate number of viral copies (<131 copies/mL). A negative result must be combined with clinical observations, patient history, and epidemiological information. The expected result is Negative. Fact Sheet for Patients:  PinkCheek.be Fact Sheet for Healthcare Providers:  GravelBags.it This test is not yet ap proved or cleared by the [place] FDA and  has been authorized for detection and/or diagnosis of SARS-CoV-2 by FDA under an Emergency Use Authorization (EUA). This EUA will remain  in effect (meaning this test can be used) for the duration of the COVID-19 declaration under Section 564(b)(1) of the Act, 21 U.S.C. section 360bbb-3(b)(1), unless the authorization is terminated or revoked sooner.    Influenza A by PCR NEGATIVE NEGATIVE Final   Influenza B by PCR NEGATIVE NEGATIVE Final    Comment: (NOTE) The Xpert Xpress SARS-CoV-2/FLU/RSV assay is intended as an aid in  the diagnosis of influenza from Nasopharyngeal swab specimens and  should not be used as a sole basis for treatment. Nasal washings and  aspirates are unacceptable for Xpert Xpress SARS-CoV-2/FLU/RSV  testing. Fact Sheet for Patients: PinkCheek.be Fact Sheet for Healthcare Providers: GravelBags.it This test is not yet approved or cleared by the [place] FDA and  has been authorized for detection and/or diagnosis of SARS-CoV-2 by  FDA under an Emergency Use Authorization (EUA). This EUA will remain  in effect (meaning this test can be used) for the duration of the  Covid-19 declaration under Section 564(b)(1) of the Act, 21  U.S.C. section 360bbb-3(b)(1), unless the authorization is  terminated or revoked. Performed at [facility],  [address] [place], [place] [address]   CULTURE, BLOOD (ROUTINE X 2) w Reflex to ID Panel     Status: None (Preliminary result)   Collection Time: 05/29/19  1:27 PM   Specimen: BLOOD  Result Value Ref Range Status   Specimen Description BLOOD BLOOD RIGHT HAND  Final   Special Requests   Final    BOTTLES DRAWN AEROBIC ONLY Blood Culture results may not be optimal due to an inadequate volume of blood received in culture bottles   Culture   Final    NO GROWTH < 24 HOURS Performed at [facility], [address], [place], [place] [address]    Report Status PENDING  Incomplete  CULTURE, BLOOD (ROUTINE X 2) w Reflex to ID Panel     Status: None (Preliminary result)   Collection Time: 05/29/19  2:10 PM   Specimen: BLOOD RIGHT ARM  Result Value Ref Range Status   Specimen Description BLOOD RIGHT ARM  Final   Special Requests   Final    BOTTLES DRAWN AEROBIC AND ANAEROBIC Blood Culture adequate volume  Culture   Final    NO GROWTH < 24 HOURS Performed at [facility], [address] [place], [place] [address]    Report Status PENDING  Incomplete         Radiology Studies: US Abdomen Limited  Result Date: 05/29/2019 CLINICAL DATA:  Ascites. EXAM: ULTRASOUND ABDOMEN LIMITED COMPARISON:  CT 09/14/2014. FINDINGS: Mild ascites noted. No large pocket of fluid was noted for safe paracentesis. Paracentesis not performed. Follow-up exam can be obtained as needed. IMPRESSION: Mild ascites. No large pocket of fluid was noted for safe paracentesis. Paracentesis not performed. Electronically Signed   By: Marcello Moores  Register   On: 05/29/2019 14:05   DG Chest Portable 1 View  Result Date: 05/29/2019 CLINICAL DATA:  Shortness of breath EXAM: PORTABLE CHEST 1 VIEW COMPARISON:  September 13, 2014 FINDINGS: The heart size and mediastinal contours are within normal limits. Both lungs are clear. Cervical fixation hardware seen. IMPRESSION: No active disease. Electronically Signed   By:  Prudencio Pair M.D.   On: 05/29/2019 06:28        Scheduled Meds: . aspirin EC  81 mg Oral Daily  . lactulose  20 g Oral Daily  . pantoprazole  40 mg Oral Daily  . pravastatin  20 mg Oral q1800   Continuous Infusions: . sodium chloride 75 mL/hr at 05/30/19 0800  . cefTRIAXone (ROCEPHIN)  IV Stopped (05/29/19 1659)     LOS: 1 day    Time spent: 35 minutes    Sidney Ace, MD [facility] Pager [phone]  If 7PM-7AM, please contact night-coverage 05/30/2019, 11:21 AM

## 2019-05-30 NOTE — TOC Initial Note (Signed)
Transition of Care [facility]) - Initial/Assessment Note    Patient Details  Name: Shannon Sawyer MRN: [id number] Date of Birth: Jul 25, 1945  [facility]) CM/SW Contact:    Eileen Stanford, LCSW Phone Number: 05/30/2019, 3:25 PM  Clinical Narrative:   CSW spoke with pt's spouse at bedside. Pt was very drowsy and unable to keep eyes open. Pt's spouse was stating concern for pt's physical abilities. Pt's spouse states he can assist pt as long as she has some strength to help. Pt's spouse states if pt is as weak as she was when she came in he thinks she will need SNF. However, if pt has regained strength then spouse has no issue bringing pt home with home health. Pt's spouse states that pt has used [facility] in the past. In the even that pt will need SNF pt's spouse states he would like her to go to [facility]. CSW will continue to follow to determine physical abilities closer to d/c. Pt will need prior auth if going to SNF.               Expected Discharge Plan: Skilled Nursing Facility Barriers to Discharge: Continued Medical Work up   Patient Goals and CMS Choice Patient states their goals for this hospitalization and ongoing recovery are:: to get pt stronger   Choice offered to / list presented to : Adult Children  Expected Discharge Plan and Services Expected Discharge Plan: [place] In-house Referral: Clinical Social Work   Post Acute Care Choice: [place] Living arrangements for the past 2 months: [place]                                      Prior Living Arrangements/Services Living arrangements for the past 2 months: Single Family Home Lives with:: Spouse Patient language and need for interpreter reviewed:: Yes Do you feel safe going back to the place where you live?: Yes      Need for Family Participation in Patient Care: Yes (Comment) Care giver support system in place?: Yes (comment)   Criminal Activity/Legal Involvement  Pertinent to Current Situation/Hospitalization: No - Comment as needed  Activities of Daily Living Home Assistive Devices/Equipment: Eyeglasses, Cane (specify quad or straight), Walker (specify type) ADL Screening (condition at time of admission) Patient's cognitive ability adequate to safely complete daily activities?: Yes Is the patient deaf or have difficulty hearing?: No Does the patient have difficulty seeing, even when wearing glasses/contacts?: No Does the patient have difficulty concentrating, remembering, or making decisions?: No Patient able to express need for assistance with ADLs?: Yes Does the patient have difficulty dressing or bathing?: Yes Independently performs ADLs?: No Communication: Independent Dressing (OT): Needs assistance Is this a change from baseline?: Change from baseline, expected to last <3days Grooming: Needs assistance Is this a change from baseline?: Change from baseline, expected to last <3 days Feeding: Independent Bathing: Needs assistance Is this a change from baseline?: Change from baseline, expected to last <3 days Toileting: Needs assistance Is this a change from baseline?: Change from baseline, expected to last <3 days In/Out Bed: Dependent Is this a change from baseline?: Change from baseline, expected to last <3 days Walks in Home: Independent with device (comment) Does the patient have difficulty walking or climbing stairs?: Yes Weakness of Legs: Both Weakness of Arms/Hands: Both  Permission Sought/Granted Permission sought to share information with : Family Supports  Share Information with NAME: Sonia Side  Permission granted to share info w AGENCY: [facility]  Permission granted to share info w Relationship: Spouse     Emotional Assessment Appearance:: Appears stated age Attitude/Demeanor/Rapport: Unable to Assess Affect (typically observed): Other (comment)(patient was very drousey unable to keep eyes open) Orientation: : (unable to  determine) Alcohol / Substance Use: Not Applicable Psych Involvement: No (comment)  Admission diagnosis:  Abdominal distension [R14.0] Generalized weakness [R53.1] AKI (acute kidney injury) ([place]) [N17.9] Cirrhosis of liver with ascites, unspecified hepatic cirrhosis type ([place]) [K74.60, R18.8] Acute renal failure superimposed on stage 3a chronic kidney disease ([place]) [N17.9, N18.31] Patient Active Problem List   Diagnosis Date Noted  . HLD (hyperlipidemia) 05/29/2019  . Anxiety 05/29/2019  . CAD (coronary artery disease) 05/29/2019  . Acute renal failure superimposed on stage 3a chronic kidney disease ([place]) 05/29/2019  . Generalized weakness 05/29/2019  . Hyponatremia 05/29/2019  . Seasonal allergic rhinitis due to pollen 05/07/2018  . Thrombocytopenia ([place]) 02/03/2018  . H/O acute myocardial infarction 07/11/2017  . GERD (gastroesophageal reflux disease) 07/11/2017  . Bradycardia 11/06/2016  . Severe anxiety with panic 11/15/2015  . Memory change 06/30/2015  . H/O acute pancreatitis 11/18/2014  . Cervical radiculopathy 06/03/2014  . TI (tricuspid incompetence) 02/12/2014  . Carotid artery narrowing 01/12/2014  . 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 Anemia, iron deficiency 01/12/2014  . Breathlessness on exertion 01/12/2014  . Bilateral carotid artery stenosis 01/12/2014  . Cervical radiculitis 11/04/2013  . DDD (degenerative disc disease), cervical 11/04/2013  . SPL (spondylolisthesis) 11/04/2013  . Arteriosclerosis of coronary artery 05/20/2013  . Essential (primary) hypertension 05/20/2013  . Combined fat and carbohydrate induced hyperlipemia 05/20/2013  . NSTEMI (non-ST elevated myocardial infarction) ([place]) 12/04/2011  . Hyperglycemia 12/04/2011   PCP:  Virginia Crews, MD Pharmacy:   [id number] [place], [place]Phone: [phone]: [phone]    Social  Determinants of Health (SDOH) Interventions    Readmission Risk Interventions No flowsheet data found.

## 2019-05-30 NOTE — Evaluation (Signed)
Occupational Therapy Evaluation Patient Details Name: Shannon Sawyer MRN: [id number] DOB: Dec 20, 1945 Today's Date: 05/30/2019    History of Present Illness Shannon Sawyer is a 74 y.o. female with a history of Karlene Lineman cirrhosis, chronic cough, CAD, hypertension, anemia, thrombocytopenia who presents from home for generalized weakness.  Patient reports 2 weeks of progressively worsening generalized weakness.  This morning patient was unable to stand up and ambulate which prompted her husband to call 911.  She has had decreased appetite, decreased oral intake.  She does have a chronic cough and is complaining of shortness of breath which she has had for a long time   Clinical Impression   Ms Kloos was seen for OT evaluation this date. Prior to hospital admission, pt was requiring increasing assist from husband for functional mobility in home and I/ADLs and endorses 4 falls in the past year. Pt lives with her husband in a single level home with 10 STE, B rails that they are unable to reach simultaneously. Pt presents to acute OT demonstrating impaired ADL performance, functional cognition, and functional mobility 2/2 decreased command following, functional endurance/ROM/strength deficits, and fine motor impairments. Pt currently requires TOTAL A for perihygiene in standing at [facility], MIN-MOD A for OOB mobility, and MOD VCs for sequencing bed mobility.  Pt would benefit from skilled OT to address noted impairments and functional limitations (see below for any additional details) in order to maximize safety and independence while minimizing falls risk and caregiver burden. Upon hospital discharge, recommend STR to maximize pt safety and return to PLOF.     Follow Up Recommendations  SNF    Equipment Recommendations       Recommendations for Other Services       Precautions / Restrictions Precautions Precautions: Fall Restrictions Weight Bearing Restrictions: No      Mobility Bed Mobility Overal  bed mobility: Needs Assistance Bed Mobility: Supine to Sit;Sit to Supine     Supine to sit: HOB elevated;Modified independent (Device/Increase time)(Increased time + B rails + MOD VCs for sequencing. ) Sit to supine: HOB elevated(Increased time + B rails + MOD VCs for sequencing. )   General bed mobility comments: Increased time + B rails + MOD VCs for sequencing to bridge higher in bed  Transfers Overall transfer level: Needs assistance   Transfers: Sit to/from Stand;Stand Pivot Transfers Sit to Stand: Mod assist;From elevated surface Stand pivot transfers: Mod assist;From elevated surface       General transfer comment: Pt was surprised in her ability to achieve standing and used small steps to pivot from bed<>BSC    Balance Overall balance assessment: Needs assistance Sitting-balance support: Single extremity supported;Feet supported Sitting balance-Leahy Scale: Good     Standing balance support: Bilateral upper extremity supported Standing balance-Leahy Scale: Fair                             ADL either performed or assessed with clinical judgement   ADL Overall ADL's : Needs assistance/impaired                                       General ADL Comments: SETUP self-feeding seated in bed - assist to open packages (juice cup, milk carton). TOTAL A perihygiene standing at [facility].      Vision Baseline Vision/History: Wears glasses Wears Glasses: At all times Patient Visual Report: Blurring  of vision(Intermittent )       Perception     Praxis      Pertinent Vitals/Pain Pain Assessment: No/denies pain     Hand Dominance     Extremity/Trunk Assessment Upper Extremity Assessment Upper Extremity Assessment: Generalized weakness(limited fine motor - unable to open meal packaging)   Lower Extremity Assessment Lower Extremity Assessment: Generalized weakness   Cervical / Trunk Assessment Cervical / Trunk Assessment: (Decreaed neck  rotation AROM)   Communication Communication Communication: No difficulties   Cognition Arousal/Alertness: Awake/alert Behavior During Therapy: WFL for tasks assessed/performed Overall Cognitive Status: Within Functional Limits for tasks assessed                                 General Comments: Difficulty following multi-step commands    General Comments  Reclined in bed at start of session: HR 81, SpO2 100% on RA, BP 123/59, MAP 77    Exercises Exercises: Other exercises Other Exercises Other Exercises: Pt educated re: OT role, falls prevention, discharge recommendations, DME recommendations, home/routine modifications, PLB, therapuetic use of self Other Exercises: Toileting at [facility] including perihygiene, setup for meal/self-feeding, bed moblity, sup<>sit, sit<>stand x2, SPT, sitting/standing balance/tolerance   Shoulder Instructions      Home Living Family/patient expects to be discharged to:: Private residence Living Arrangements: Spouse/significant other Available Help at Discharge: Family(Husband) Type of Home: House Home Access: Stairs to enter [employer] of Steps: 10 Entrance Stairs-Rails: Right(B rails, cannot reach both) Home Layout: One level     Bathroom Shower/Tub: Occupational psychologist: Standard Bathroom Accessibility: No   Home Equipment: Environmental consultant - 4 wheels;Cane - single point          Prior Functioning/Environment Level of Independence: Independent with assistive device(s)        Comments: PTA pt used 4WW or SPC for in home ambulation. Husband does IADLs and has increasingly assisted c transfers and ADLs. Pt spends majority of day in recliner and states that when coming up steps to house she "hangs on for dear life."        OT Problem List: Decreased strength;Decreased range of motion;Decreased activity tolerance;Decreased coordination;Decreased safety awareness;Decreased knowledge of use of DME or AE      OT  Treatment/Interventions: Self-care/ADL training;Therapeutic exercise;Neuromuscular education;Energy conservation;DME and/or AE instruction;Therapeutic activities;Cognitive remediation/compensation;Patient/family education;Balance training    OT Goals(Current goals can be found in the care plan section) Acute Rehab OT Goals Patient Stated Goal: To get stronger OT Goal Formulation: With patient Time For Goal Achievement: 06/13/19 Potential to Achieve Goals: Good ADL Goals Pt Will Perform Eating: Independently;sitting Pt Will Transfer to Toilet: with min guard assist;bedside commode(c LRAD PRN) Pt Will Perform Toileting - Clothing Manipulation and hygiene: with min assist;sit to/from stand(c LRAD & AE PRN)  OT Frequency: Min 2X/week   Barriers to D/C: Inaccessible home environment;Decreased caregiver support          Co-evaluation              AM-PAC OT "6 Clicks" Daily Activity     Outcome Measure Help from another person eating meals?: A Little Help from another person taking care of personal grooming?: A Little Help from another person toileting, which includes using toliet, bedpan, or urinal?: A Lot Help from another person bathing (including washing, rinsing, drying)?: A Lot Help from another person to put on and taking off regular upper body clothing?: A Little Help from another  person to put on and taking off regular lower body clothing?: A Lot 6 Click Score: 15   End of Session Equipment Utilized During Treatment: Gait belt Nurse Communication: Mobility status  Activity Tolerance: Patient tolerated treatment well Patient left: in bed;with call bell/phone within reach;with bed alarm set  OT Visit Diagnosis: Unsteadiness on feet (R26.81);History of falling (Z91.81)                Time: 3584-4652 OT Time Calculation (min): 43 min Charges:  OT General Charges $OT Visit: 1 Visit OT Evaluation $OT Eval Moderate Complexity: 1 Mod OT Treatments $Self Care/Home  Management : 38-52 mins  Dessie Coma, M.S. OTR/L  05/30/19, 10:04 AM

## 2019-05-30 NOTE — NC FL2 (Addendum)
[place] MEDICAID FL2 LEVEL OF CARE SCREENING TOOL     IDENTIFICATION  Patient Name: Shannon Sawyer Birthdate: October 20, 1945 Sex: female Admission Date (Current Location): 05/29/2019  [place] Number:  Chiropodist and Address:  [facility], [address] [place], [place] [address]      Provider Number: [id number]  Attending Physician Name and Address:  Tresa Moore, MD  Relative Name and Phone Number:       Current Level of Care: Hospital Recommended Level of Care: Skilled Nursing Facility Prior Approval Number:   Date Approved/Denied:   PASRR Number:  [id number]  Discharge Plan: SNF    Current Diagnoses: Patient Active Problem List   Diagnosis Date Noted  . HLD (hyperlipidemia) 05/29/2019  . Anxiety 05/29/2019  . CAD (coronary artery disease) 05/29/2019  . Acute renal failure superimposed on stage 3a chronic kidney disease (HCC) 05/29/2019  . Generalized weakness 05/29/2019  . Hyponatremia 05/29/2019  . Seasonal allergic rhinitis due to pollen 05/07/2018  . Thrombocytopenia (HCC) 02/03/2018  . H/O acute myocardial infarction 07/11/2017  . GERD (gastroesophageal reflux disease) 07/11/2017  . Bradycardia 11/06/2016  . Severe anxiety with panic 11/15/2015  . Memory change 06/30/2015  . H/O acute pancreatitis 11/18/2014  . Cervical radiculopathy 06/03/2014  . TI (tricuspid incompetence) 02/12/2014  . Carotid artery narrowing 01/12/2014  . Cirrhosis of liver not due to alcohol (HCC) 01/12/2014  . Anemia, iron deficiency 01/12/2014  . Breathlessness on exertion 01/12/2014  . Bilateral carotid artery stenosis 01/12/2014  . Cervical radiculitis 11/04/2013  . DDD (degenerative disc disease), cervical 11/04/2013  . SPL (spondylolisthesis) 11/04/2013  . Arteriosclerosis of coronary artery 05/20/2013  . Essential (primary) hypertension 05/20/2013  . Combined fat and carbohydrate induced hyperlipemia 05/20/2013  .  NSTEMI (non-ST elevated myocardial infarction) (HCC) 12/04/2011  . Hyperglycemia 12/04/2011    Orientation RESPIRATION BLADDER Height & Weight     Self, Time, Situation, Place  Normal Continent Weight: 160 lb (72.6 kg) Height:  5\' 2"  (157.5 cm)  BEHAVIORAL SYMPTOMS/MOOD NEUROLOGICAL BOWEL NUTRITION STATUS      Continent Diet(heart healthy, thin liquids)  AMBULATORY STATUS COMMUNICATION OF NEEDS Skin   Limited Assist Verbally Normal                       Personal Care Assistance Level of Assistance  Dressing, Feeding, Bathing Bathing Assistance: Limited assistance Feeding assistance: Independent Dressing Assistance: Limited assistance     Functional Limitations Info  Sight, Hearing, Speech Sight Info: Adequate Hearing Info: Adequate Speech Info: Adequate    SPECIAL CARE FACTORS FREQUENCY  PT (By licensed PT), OT (By licensed OT)     PT Frequency: 5x OT Frequency: 5x            Contractures Contractures Info: Not present    Additional Factors Info  Code Status, Allergies Code Status Info: Full Code Allergies Info: Contrast Media  (Iodinated Diagnostic Agents), Azelastine, Flonase (Fluticasone Propionate), Prednisone, Shellfish Allergy, Tramadol           Current Medications (05/30/2019):  This is the current hospital active medication list Current Facility-Administered Medications  Medication Dose Route Frequency Provider Last Rate Last Admin  . 0.9 %  sodium chloride infusion   Intravenous Continuous Lorretta Harp, MD   Stopped at 05/30/19 1350  . aspirin EC tablet 81 mg  81 mg Oral Daily Lorretta Harp, MD   81 mg at 05/30/19 4034  . cefTRIAXone (ROCEPHIN) 2 g in sodium  chloride 0.9 % 100 mL IVPB  2 g Intravenous Q24H Lorretta Harp, MD 200 mL/hr at 05/30/19 1400 Rate Verify at 05/30/19 1400  . lactulose (CHRONULAC) 10 GM/15ML solution 20 g  20 g Oral Daily Lorretta Harp, MD   20 g at 05/30/19 0803  . nitroGLYCERIN (NITROSTAT) SL tablet 0.4 mg  0.4 mg Sublingual Q5 min  PRN Lorretta Harp, MD      . ondansetron [facility]) injection 4 mg  4 mg Intravenous Q8H PRN Lorretta Harp, MD      . pantoprazole (PROTONIX) EC tablet 40 mg  40 mg Oral Daily Lorretta Harp, MD   40 mg at 05/30/19 0803  . pravastatin (PRAVACHOL) tablet 20 mg  20 mg Oral q1800 Lorretta Harp, MD         Discharge Medications: Please see discharge summary for a list of discharge medications.  Relevant Imaging Results:  Relevant Lab Results:   Additional Information SSN:[id number]  Reuel Boom Jonta Gastineau, LCSW

## 2019-05-30 NOTE — Evaluation (Signed)
Physical Therapy Evaluation Patient Details Name: Shannon Sawyer MRN: [id number] DOB: 04/15/1945 Today's Date: 05/30/2019   History of Present Illness  presented to ER secondary to generalized weakness; admitted for management of acute renal failure superimposed on stage 3 CKD.  Does endorse "slide from bed" prior to admission, but no injury reported.  Clinical Impression  Upon evaluation, patient alert and oriented to basic information; follows simple commands, but generally delayed in processing and task initiation.  Bilat UE/LE strength and ROM grossly symmetrical, generally weakened (at least 4-/5) due to acute illness. No focal weakness or asymmetry appreciated.  All movement grossly bradykinetic with increased time required.  Currently completing bed mobility with mod indep; sit/stand, basic transfers and gait (15') with RW, cga/min assist.  Demonstrates partially step to vs step through gait pattern; somewhat inconsistent foot placement.  Min assist for walker position/management and obstacle negotiation, tends to veer L requiring assist from therapist for correction.  Do recommend continued use of RW and +1 assist for optimal safety at this time. Would benefit from skilled PT to address above deficits and promote optimal return to PLOF.; Recommend transition to HHPT upon discharge from acute hospitalization.     Follow Up Recommendations Home health PT;Supervision/Assistance - 24 hour    Equipment Recommendations  Rolling walker with 5" wheels;3in1 (PT)    Recommendations for Other Services       Precautions / Restrictions Precautions Precautions: Fall Restrictions Weight Bearing Restrictions: No      Mobility  Bed Mobility Overal bed mobility: Modified Independent Bed Mobility: Supine to Sit     Supine to sit: Modified independent (Device/Increase time) Sit to supine: HOB elevated(Increased time + B rails + MOD VCs for sequencing. )   General bed mobility comments:  Increased time + B rails + MOD VCs for sequencing to bridge higher in bed  Transfers Overall transfer level: Needs assistance Equipment used: Rolling walker (2 wheeled) Transfers: Sit to/from Stand Sit to Stand: Min assist Stand pivot transfers: Mod assist;From elevated surface       General transfer comment: cuing for hand placement; min assist for lift off and balance, increased time/effort for anterior weight translation  Ambulation/Gait Ambulation/Gait assistance: Min assist;Min guard Gait Distance (Feet): 15 Feet Assistive device: Rolling walker (2 wheeled)       General Gait Details: partially step to vs step through gait pattern; somewhat inconsistent foot placement.  Min assist for walker position/management and obstacle negotiation, tends to veer L requiring assist from therapist for correction.  Stairs            Wheelchair Mobility    Modified Rankin (Stroke Patients Only)       Balance Overall balance assessment: Needs assistance Sitting-balance support: No upper extremity supported;Feet supported Sitting balance-Leahy Scale: Good     Standing balance support: Bilateral upper extremity supported Standing balance-Leahy Scale: Fair                               Pertinent Vitals/Pain Pain Assessment: No/denies pain    Home Living Family/patient expects to be discharged to:: Private residence Living Arrangements: Spouse/significant other Available Help at Discharge: Family Type of Home: House Home Access: Stairs to enter Entrance Stairs-Rails: Right Entrance Stairs-Number of Steps: 10 Home Layout: One level Home Equipment: Environmental consultant - 4 wheels;Cane - single point      Prior Function Level of Independence: Independent with assistive device(s)  Comments: PTA pt used 4WW or SPC for in home ambulation. Husband does IADLs and has increasingly assisted c transfers and ADLs. Pt spends majority of day in recliner and states that when  coming up steps to house she "hangs on for dear life."     Hand Dominance        Extremity/Trunk Assessment   Upper Extremity Assessment Upper Extremity Assessment: Generalized weakness(grossly 4-/5 throughout)    Lower Extremity Assessment Lower Extremity Assessment: Generalized weakness(grossly 4-/5 throughout; no focal weakness appreciated)    Cervical / Trunk Assessment Cervical / Trunk Assessment: (Decreaed neck rotation AROM)  Communication   Communication: No difficulties  Cognition Arousal/Alertness: Awake/alert Behavior During Therapy: Flat affect Overall Cognitive Status: Within Functional Limits for tasks assessed                                 General Comments: delayed processing and task initiation      General Comments General comments (skin integrity, edema, etc.): Reclined in bed at start of session: HR 81, SpO2 100% on RA, BP 123/59, MAP 77    Exercises Other Exercises Other Exercises: Toilet transfer, SPT with RW, min assist for sit/stand, standing balance; dep assist for hygiene. Other Exercises: Oral care, standing at sink; min assist for set up, cga/close sup for standing balance at sink during task.  Limited functional reach evident; does require UE stabilization to maximize safety (patient indep initiating).   Assessment/Plan    PT Assessment Patient needs continued PT services  PT Problem List Decreased strength;Decreased activity tolerance;Decreased balance;Decreased mobility;Decreased cognition;Decreased knowledge of use of DME;Decreased safety awareness;Decreased knowledge of precautions;Impaired sensation       PT Treatment Interventions DME instruction;Gait training;Stair training;Functional mobility training;Therapeutic activities;Therapeutic exercise;Balance training;Patient/family education;Cognitive remediation    PT Goals (Current goals can be found in the Care Plan section)  Acute Rehab PT Goals Patient Stated Goal: To  get stronger PT Goal Formulation: With patient Time For Goal Achievement: 06/13/19 Potential to Achieve Goals: Good    Frequency Min 2X/week   Barriers to discharge        Co-evaluation               AM-PAC PT "6 Clicks" Mobility  Outcome Measure Help needed turning from your back to your side while in a flat bed without using bedrails?: None Help needed moving from lying on your back to sitting on the side of a flat bed without using bedrails?: None Help needed moving to and from a bed to a chair (including a wheelchair)?: A Little Help needed standing up from a chair using your arms (e.g., wheelchair or bedside chair)?: A Little Help needed to walk in hospital room?: A Little Help needed climbing 3-5 steps with a railing? : A Little 6 Click Score: 20    End of Session Equipment Utilized During Treatment: Gait belt Activity Tolerance: Patient tolerated treatment well Patient left: with call bell/phone within reach;in chair;with chair alarm set Nurse Communication: Mobility status PT Visit Diagnosis: Muscle weakness (generalized) (M62.81);Difficulty in walking, not elsewhere classified (R26.2)    Time: 4132-4401 PT Time Calculation (min) (ACUTE ONLY): 25 min   Charges:   PT Evaluation $PT Eval Moderate Complexity: 1 Mod PT Treatments $Therapeutic Activity: 8-22 mins        Eryc Bodey H. Manson Passey, PT, DPT, NCS 05/30/19, 10:08 AM [phone]

## 2019-05-30 NOTE — Plan of Care (Signed)
Continuing with plan of care. 

## 2019-05-31 LAB — CBC WITH DIFFERENTIAL/PLATELET
Abs Immature Granulocytes: 0.03 10*3/uL (ref 0.00–0.07)
Basophils Absolute: 0 10*3/uL (ref 0.0–0.1)
Basophils Relative: 1 %
Eosinophils Absolute: 0.5 10*3/uL (ref 0.0–0.5)
Eosinophils Relative: 7 %
HCT: 23 % — ABNORMAL LOW (ref 36.0–46.0)
Hemoglobin: 8.1 g/dL — ABNORMAL LOW (ref 12.0–15.0)
Immature Granulocytes: 0 %
Lymphocytes Relative: 26 %
Lymphs Abs: 1.9 10*3/uL (ref 0.7–4.0)
MCH: 33.9 pg (ref 26.0–34.0)
MCHC: 35.2 g/dL (ref 30.0–36.0)
MCV: 96.2 fL (ref 80.0–100.0)
Monocytes Absolute: 0.9 10*3/uL (ref 0.1–1.0)
Monocytes Relative: 12 %
Neutro Abs: 4.1 10*3/uL (ref 1.7–7.7)
Neutrophils Relative %: 54 %
Platelets: 86 10*3/uL — ABNORMAL LOW (ref 150–400)
RBC: 2.39 MIL/uL — ABNORMAL LOW (ref 3.87–5.11)
RDW: 16.4 % — ABNORMAL HIGH (ref 11.5–15.5)
WBC: 7.6 10*3/uL (ref 4.0–10.5)
nRBC: 0 % (ref 0.0–0.2)

## 2019-05-31 LAB — COMPREHENSIVE METABOLIC PANEL
ALT: 27 U/L (ref 0–44)
AST: 55 U/L — ABNORMAL HIGH (ref 15–41)
Albumin: 2.4 g/dL — ABNORMAL LOW (ref 3.5–5.0)
Alkaline Phosphatase: 94 U/L (ref 38–126)
Anion gap: 8 (ref 5–15)
BUN: 32 mg/dL — ABNORMAL HIGH (ref 8–23)
CO2: 18 mmol/L — ABNORMAL LOW (ref 22–32)
Calcium: 8.3 mg/dL — ABNORMAL LOW (ref 8.9–10.3)
Chloride: 107 mmol/L (ref 98–111)
Creatinine, Ser: 1.65 mg/dL — ABNORMAL HIGH (ref 0.44–1.00)
GFR calc Af Amer: 35 mL/min — ABNORMAL LOW (ref >60)
GFR calc non Af Amer: 30 mL/min — ABNORMAL LOW (ref >60)
Glucose, Bld: 84 mg/dL (ref 70–99)
Potassium: 3.5 mmol/L (ref 3.5–5.1)
Sodium: 133 mmol/L — ABNORMAL LOW (ref 135–145)
Total Bilirubin: 1.8 mg/dL — ABNORMAL HIGH (ref 0.3–1.2)
Total Protein: 5 g/dL — ABNORMAL LOW (ref 6.5–8.1)

## 2019-05-31 LAB — GLUCOSE, CAPILLARY: Glucose-Capillary: 82 mg/dL (ref 70–99)

## 2019-05-31 MED ORDER — FUROSEMIDE 20 MG PO TABS
20.0000 mg | ORAL_TABLET | Freq: Every day | ORAL | Status: DC
  Administered 2019-05-31 – 2019-06-01 (×2): 20 mg via ORAL
  Filled 2019-05-31 (×2): qty 1

## 2019-05-31 MED ORDER — SPIRONOLACTONE 25 MG PO TABS
25.0000 mg | ORAL_TABLET | Freq: Every day | ORAL | Status: DC
  Administered 2019-05-31 – 2019-06-01 (×2): 25 mg via ORAL
  Filled 2019-05-31 (×2): qty 1

## 2019-05-31 MED ORDER — SODIUM CHLORIDE 0.9 % IV SOLN
2.0000 g | Freq: Every day | INTRAVENOUS | Status: DC
  Administered 2019-05-31: 2 g via INTRAVENOUS
  Filled 2019-05-31: qty 20
  Filled 2019-05-31: qty 2

## 2019-05-31 NOTE — Progress Notes (Signed)
Abdomen slightly distended this morning, bowel sounds active, soft to firm to touch.  Dr. Priscella Mann aware and ordered to d/c IV fluids and po lasix and spirinolactone.

## 2019-05-31 NOTE — Progress Notes (Addendum)
Physical Therapy Treatment Patient Details Name: Shannon Sawyer MRN: [id number] DOB: 02-17-1946 Today's Date: 05/31/2019    History of Present Illness presented to ER secondary to generalized weakness; admitted for management of acute renal failure superimposed on stage 3 CKD.  Does endorse "slide from bed" prior to admission, but no injury reported.    PT Comments    Patient continues to complete functional mobility tasks with RW, cga level of assist from therapist.  Able to increase gait distance to 100' with RW, assist to correct L veer during distance (baseline gait characteristic husband report).  Slow and cautious, but no overt buckling, LOB throughout distance.   Remains intermittently confused to more complex information, but follows simple commands consistently and participates well with session. Will plan to initiate stair training next session to assess ability to negotiate stairs for entry/exit of home. Husband present to observe patient performance throughout session.  Reviewed current performance, level of assist currently requiring and discharge recommendations/options.  Husband with questions about patient prognosis and long-term care needs; deferred discussion to attending physician.    Follow Up Recommendations  Home health PT;Supervision/Assistance - 24 hour     Equipment Recommendations  Rolling walker with 5" wheels;3in1 (PT)    Recommendations for Other Services       Precautions / Restrictions Precautions Precautions: Fall Restrictions Weight Bearing Restrictions: No    Mobility  Bed Mobility Overal bed mobility: Modified Independent Bed Mobility: Supine to Sit     Supine to sit: Modified independent (Device/Increase time) Sit to supine: HOB elevated   General bed mobility comments: HOB elevated and bedrails utilized to complete supine to sit; increased/effort to complete, but able to do without physical assist  Transfers Overall transfer level: Needs  assistance Equipment used: Rolling walker (2 wheeled) Transfers: Sit to/from Stand Sit to Stand: Min guard;Min assist         General transfer comment: cuing for hand placement; fair/good LE strength with lift off  Ambulation/Gait Ambulation/Gait assistance: Min guard;Min assist Gait Distance (Feet): 100 Feet Assistive device: Rolling walker (2 wheeled)       General Gait Details: reciprocal stepping pattern, but decreased step height/length; decreased cadence, but no overt buckling or LOB.  Continues to veer left at times (min assist for walke rmanagement around obstacles), husband does report this is baseline for patient.   Stairs             Wheelchair Mobility    Modified Rankin (Stroke Patients Only)       Balance Overall balance assessment: Needs assistance Sitting-balance support: No upper extremity supported;Feet supported Sitting balance-Leahy Scale: Good     Standing balance support: Bilateral upper extremity supported Standing balance-Leahy Scale: Fair                              Cognition Arousal/Alertness: Awake/alert Behavior During Therapy: Flat affect                                   General Comments: delayed processing and task initiation      Exercises Other Exercises Other Exercises: Toilet transfer, ambulatory with RW, cga; sit/stand from standard toilet with grab bar, RW, cga; dep assist for peri-care. Other Exercises: Seated edge of bed, participated with bilat LE active ROM therex: ankle pumps, LAQs, requiring therapist demonstration for full task comprehension.    General Comments  Pertinent Vitals/Pain Pain Assessment: Faces Faces Pain Scale: Hurts a little bit Pain Location: L LE, unable to fully articulate location/sensation Pain Descriptors / Indicators: Aching;Guarding;Grimacing Pain Intervention(s): Limited activity within patient's tolerance;Monitored during session;Repositioned     Home Living                      Prior Function            PT Goals (current goals can now be found in the care plan section) Acute Rehab PT Goals Patient Stated Goal: To get stronger PT Goal Formulation: With patient Time For Goal Achievement: 06/13/19 Potential to Achieve Goals: Good Progress towards PT goals: Progressing toward goals    Frequency    Min 2X/week      PT Plan Current plan remains appropriate    Co-evaluation              AM-PAC PT "6 Clicks" Mobility   Outcome Measure  Help needed turning from your back to your side while in a flat bed without using bedrails?: None Help needed moving from lying on your back to sitting on the side of a flat bed without using bedrails?: None Help needed moving to and from a bed to a chair (including a wheelchair)?: A Little Help needed standing up from a chair using your arms (e.g., wheelchair or bedside chair)?: A Little Help needed to walk in hospital room?: A Little Help needed climbing 3-5 steps with a railing? : A Little 6 Click Score: 20    End of Session Equipment Utilized During Treatment: Gait belt Activity Tolerance: Patient tolerated treatment well Patient left: with call bell/phone within reach;in chair;with chair alarm set;with family/visitor present Nurse Communication: Mobility status PT Visit Diagnosis: Muscle weakness (generalized) (M62.81);Difficulty in walking, not elsewhere classified (R26.2)     Time: 1610-9604 PT Time Calculation (min) (ACUTE ONLY): 31 min  Charges:  $Gait Training: 8-22 mins $Therapeutic Activity: 8-22 mins                    Semaya Vida H. Manson Passey, PT, DPT, NCS 05/31/19, 4:05 PM [phone]

## 2019-05-31 NOTE — TOC Progression Note (Signed)
Transition of Care [facility]) - Progression Note    Patient Details  Name: Shannon Sawyer MRN: [id number] Date of Birth: 1945-05-17  [facility] CM/SW Contact  Eileen Stanford, LCSW Phone Number: 05/31/2019, 3:56 PM  Clinical Narrative:   [facility] has accepted pt. Authorization has been initiated.    Expected Discharge Plan: [place] Barriers to Discharge: Continued Medical Work up  Expected Discharge Plan and Services Expected Discharge Plan: [place] In-house Referral: Clinical Social Work   Post Acute Care Choice: [place] Living arrangements for the past 2 months: Single Family Home                                       Social Determinants of Health (SDOH) Interventions    Readmission Risk Interventions No flowsheet data found.

## 2019-05-31 NOTE — Plan of Care (Signed)
Continuing with plan of care. 

## 2019-05-31 NOTE — Progress Notes (Signed)
PROGRESS NOTE    Shannon Sawyer  DHR:[id number] DOB: 1945-12-14 DOA: 05/29/2019 PCP: Virginia Crews, MD   Brief Narrative:  HPI: Shannon Sawyer is a 74 y.o. female with medical history significant of hypertension, hyperlipidemia, GERD, depression with anxiety, tricuspid valve insufficiency, CAD, anemia, carotid artery stenosis, liver cirrhosis-NASH, CKD stage III, thrombocytopenia, pancreatitis, who presents with generalized weakness.  Patient states that she has been having generalized weakness in the past several days, which has worsened today.  She normally uses walker to walk around, but has difficulty walking now.  No unilateral numbness or tingling to extremities.  No facial droop or slurred speech.  Patient states he has vomited once earlier, currently no nausea vomiting, abdominal pain.  She has abdominal distention. She has loose stool bowel movement since she is taking lactulose.  Patient has a mild cough, but no chest pain or shortness breath.  No symptoms of UTI.  No fever or chills.    3/27: Patient seen and examined.  Reports symptomatic improvement since yesterday.  Per physical therapy evaluation patient still markedly weak.  Recommending home with home health.  Kidney function improved over interval.  On interview patient states that she has not had bowel movement for several days and had 2 loose bowel movements this morning.  3/28: Patient seen and examined.  Mental status improved.  Weakness improved though still very prevalent.  Per nursing staff patient had trouble even pivoting to the bedside commode.  Physical therapy is recommended home with home health however at this time I do not feel that home would be a safe disposition.  I spoken to the husband.  The patient has sick steps getting up into her house.  She does not qualify for EMS transport and is unable to even enter the house.  Her kidney function is improved but not at baseline.  Patient had abdominal distention  today on exam.  Nontender.  Not bothersome to patient.  Mental status at baseline.   Assessment & Plan:   Principal Problem:   Acute renal failure superimposed on stage 3a chronic kidney disease (HCC) Active Problems:   Cirrhosis of liver not due to alcohol (HCC)   Essential (primary) hypertension   Anemia, iron deficiency   GERD (gastroesophageal reflux disease)   Thrombocytopenia (HCC)   HLD (hyperlipidemia)   CAD (coronary artery disease)   Generalized weakness   Hyponatremia  Acute renal failure superimposed on stage 3a chronic kidney disease ([place]): Baseline Cre is 1.1, pt's Cre is 2.27 and BUN 40 on admission.  Likely due to prerenal secondary to dehydration and continuation of ARB, diuretics Kidney function improving over interval Abdominal distention likely secondary to intravenous fluids and holding home diuretics Creatinine 1.65 today, baseline 1 Plan: DC normal saline Resume home Lasix Resume home spironolactone Continue to hold home Cozaar Daily BMP We will need to monitor for additional 24 hours after restarting home diuretics.  If kidney function remains stable tomorrow patient may be stable for disposition  Generalized weakness  Likely multifactorial etiology Patient with poor p.o. intake and potentially progression of underlying chronic liver disease Physical therapy occupational therapy recommended home with home health I spoken to the patient's husband at length.  He states that the patient has 6 steps to enter her home.  At this time she is unable to walk up to 6 steps.  Requested reevaluation by physical therapy.  This patient may be better served in a monitored setting post discharge.  Have notified case management  who are currently working on facility.   Cirrhosis of liver not due to alcohol:   Mental status seems to be okay.  Ammonia level 26.   No hepatic encephalopathy. pt has abdominal distention.   She has leukocytosis with WBC 15.3.  Suspecting  SBP.   Initially consulted IR, Dr. Earleen Newport and ordered paracentesis, but abdominal ultrasound showed mild ascites without large pocket for paracentesis. Per patient she had not had a bowel movement for 3 days leading up till today.  This may be contributing to some of her weakness She has had multiple bowel movement since admission Clinical suspicion of SBP is low though I do not have alternative explanation for leukocytosis on admission Plan: -Continue home lactulose -Continue empiric Rocephin -Follow blood cultures, NGTD -Ensure 3-4 soft BM daily  Hyponatremia: Na 130. -Urine studies indicative of chronic hyponatremia secondary to liver disease - IVF: as above - f/u by BMP - avoid over correction too fast due to risk of central pontine myelinolysis   HTN Blood pressure soft on presentation Medications were held Plan: Lasix and Aldactone restarted Cozaar on hold Monitor blood pressure and kidney function, restart medications as appropriate  Anemia, iron deficiency: Hgb 11.4 -f/u by CBC  GERD (gastroesophageal reflux disease) -Protonix  Thrombocytopenia ([place]): Platelet 121.  Most likely due to liver cirrhosis. -Follow-up with CBC  HLD (hyperlipidemia) -Pravastatin  CAD (coronary artery disease): no CP -continue ASA, statin, and prn NTG     DVT prophylaxis: SCDs Code Status: Full Family Communication: Husband Carlisa Eble via phone [phone] on 05/31/2019 Disposition Plan: Plan to discharge to skilled nursing facility.  Case management aware and is working on authorization.  Current barriers to discharge include persistent acute kidney injury, though improving.  Plan for discharge to skilled nursing facility once kidney function normalizes, anticipate 24 to 48 hours   consultants:   None  Procedures:   None  Antimicrobials:  Rocephin (05/29/2019-  )   Subjective: Patient seen and examined Energy level improved No pain  complaints  Objective: Vitals:   05/30/19 1142 05/30/19 1925 05/31/19 0426 05/31/19 1129  BP: (!) 119/57 (!) 106/42 120/67 (!) 123/49  Pulse: 91 96 94 100  Resp:  16 16   Temp: 97.9 F (36.6 C) 98.2 F (36.8 C) 97.7 F (36.5 C) 98.1 F (36.7 C)  TempSrc:  Oral Oral Oral  SpO2: 100% 99% 100% 100%  Weight:      Height:        Intake/Output Summary (Last 24 hours) at 05/31/2019 1254 Last data filed at 05/31/2019 0926 Gross per 24 hour  Intake 1783.72 ml  Output --  Net 1783.72 ml   Filed Weights   05/29/19 0555  Weight: 72.6 kg    Examination:  General exam: Appears calm and comfortable  Respiratory system: Clear to auscultation. Respiratory effort normal. Cardiovascular system: S1 & S2 heard, RRR. No JVD, murmurs, rubs, gallops or clicks. No pedal edema. Gastrointestinal system: Soft, distended.  No pain.  Positive bowel sounds Central nervous system: Alert and oriented. No focal neurological deficits. Extremities: Symmetric 5 x 5 power. Skin: No rashes, lesions or ulcers Psychiatry: Judgement and insight appear normal. Mood & affect appropriate.     Data Reviewed: I have personally reviewed following labs and imaging studies  CBC: Recent Labs  Lab 05/29/19 0805 05/30/19 0538 05/31/19 0425  WBC 15.3* 9.6  9.4 7.6  NEUTROABS 9.9* 5.5 4.1  HGB 11.4* 8.7*  8.6* 8.1*  HCT 31.5* 23.8*  23.8* 23.0*  MCV 93.8 93.0  96.4 96.2  PLT 121* 87*  87* 86*   Basic Metabolic Panel: Recent Labs  Lab 05/29/19 0805 05/30/19 0538 05/31/19 0425  NA 130* 133* 133*  K 3.9 3.8 3.5  CL 99 105 107  CO2 19* 19* 18*  GLUCOSE 103* 84 84  BUN 40* 36* 32*  CREATININE 2.27* 1.79* 1.65*  CALCIUM 9.5 8.8* 8.3*  MG 1.7  --   --    GFR: Estimated Creatinine Clearance: 27.9 mL/min (A) (by C-G formula based on SCr of 1.65 mg/dL (H)). Liver Function Tests: Recent Labs  Lab 05/29/19 0805 05/30/19 0538 05/31/19 0425  AST 61* 51* 55*  ALT 31 24 27   ALKPHOS 158* 99 94   BILITOT 2.9* 2.1* 1.8*  PROT 6.4* 5.1* 5.0*  ALBUMIN 2.7* 2.5* 2.4*   Recent Labs  Lab 05/29/19 0805  LIPASE 77*   Recent Labs  Lab 05/29/19 0603  AMMONIA 26   Coagulation Profile: Recent Labs  Lab 05/29/19 0805  INR 1.3*   Cardiac Enzymes: No results for input(s): CKTOTAL, CKMB, CKMBINDEX, TROPONINI in the last 168 hours. BNP (last 3 results) No results for input(s): PROBNP in the last 8760 hours. HbA1C: No results for input(s): HGBA1C in the last 72 hours. CBG: Recent Labs  Lab 05/30/19 0735 05/31/19 0741  GLUCAP 79 82   Lipid Profile: No results for input(s): CHOL, HDL, LDLCALC, TRIG, CHOLHDL, LDLDIRECT in the last 72 hours. Thyroid Function Tests: No results for input(s): TSH, T4TOTAL, FREET4, T3FREE, THYROIDAB in the last 72 hours. Anemia Panel: Recent Labs    05/30/19 1034 05/30/19 1057  VITAMINB12  --  898  FOLATE 12.1  --   FERRITIN 1,043*  --   TIBC 132*  --   IRON 122  --    Sepsis Labs: No results for input(s): PROCALCITON, LATICACIDVEN in the last 168 hours.  Recent Results (from the past 240 hour(s))  Respiratory Panel by RT PCR (Flu A&B, Covid) - Nasopharyngeal Swab     Status: None   Collection Time: 05/29/19  7:39 AM   Specimen: Nasopharyngeal Swab  Result Value Ref Range Status   SARS Coronavirus 2 by RT PCR NEGATIVE NEGATIVE Final    Comment: (NOTE) SARS-CoV-2 target nucleic acids are NOT DETECTED. The SARS-CoV-2 RNA is generally detectable in upper respiratoy specimens during the acute phase of infection. The lowest concentration of SARS-CoV-2 viral copies this assay can detect is 131 copies/mL. A negative result does not preclude SARS-Cov-2 infection and should not be used as the sole basis for treatment or other patient management decisions. A negative result may occur with  improper specimen collection/handling, submission of specimen other than nasopharyngeal swab, presence of viral mutation(s) within the areas targeted by  this assay, and inadequate number of viral copies (<131 copies/mL). A negative result must be combined with clinical observations, patient history, and epidemiological information. The expected result is Negative. Fact Sheet for Patients:  PinkCheek.be Fact Sheet for Healthcare Providers:  GravelBags.it This test is not yet ap proved or cleared by the [place] FDA and  has been authorized for detection and/or diagnosis of SARS-CoV-2 by FDA under an Emergency Use Authorization (EUA). This EUA will remain  in effect (meaning this test can be used) for the duration of the COVID-19 declaration under Section 564(b)(1) of the Act, 21 U.S.C. section 360bbb-3(b)(1), unless the authorization is terminated or revoked sooner.    Influenza A by PCR NEGATIVE NEGATIVE Final   Influenza B by PCR  NEGATIVE NEGATIVE Final    Comment: (NOTE) The Xpert Xpress SARS-CoV-2/FLU/RSV assay is intended as an aid in  the diagnosis of influenza from Nasopharyngeal swab specimens and  should not be used as a sole basis for treatment. Nasal washings and  aspirates are unacceptable for Xpert Xpress SARS-CoV-2/FLU/RSV  testing. Fact Sheet for Patients: PinkCheek.be Fact Sheet for Healthcare Providers: GravelBags.it This test is not yet approved or cleared by the [place] FDA and  has been authorized for detection and/or diagnosis of SARS-CoV-2 by  FDA under an Emergency Use Authorization (EUA). This EUA will remain  in effect (meaning this test can be used) for the duration of the  Covid-19 declaration under Section 564(b)(1) of the Act, 21  U.S.C. section 360bbb-3(b)(1), unless the authorization is  terminated or revoked. Performed at [facility], [address] [place], [place] [address]   CULTURE, BLOOD (ROUTINE X 2) w Reflex to ID Panel     Status: None (Preliminary result)    Collection Time: 05/29/19  1:27 PM   Specimen: BLOOD  Result Value Ref Range Status   Specimen Description BLOOD BLOOD RIGHT HAND  Final   Special Requests   Final    BOTTLES DRAWN AEROBIC ONLY Blood Culture results may not be optimal due to an inadequate volume of blood received in culture bottles   Culture   Final    NO GROWTH 2 DAYS Performed at [facility], [address] [place], [place] [address]    Report Status PENDING  Incomplete  CULTURE, BLOOD (ROUTINE X 2) w Reflex to ID Panel     Status: None (Preliminary result)   Collection Time: 05/29/19  2:10 PM   Specimen: BLOOD RIGHT ARM  Result Value Ref Range Status   Specimen Description BLOOD RIGHT ARM  Final   Special Requests   Final    BOTTLES DRAWN AEROBIC AND ANAEROBIC Blood Culture adequate volume   Culture   Final    NO GROWTH 2 DAYS Performed at [facility], [address], [place], [place] [address]    Report Status PENDING  Incomplete         Radiology Studies: US Abdomen Limited  Result Date: 05/29/2019 CLINICAL DATA:  Ascites. EXAM: ULTRASOUND ABDOMEN LIMITED COMPARISON:  CT 09/14/2014. FINDINGS: Mild ascites noted. No large pocket of fluid was noted for safe paracentesis. Paracentesis not performed. Follow-up exam can be obtained as needed. IMPRESSION: Mild ascites. No large pocket of fluid was noted for safe paracentesis. Paracentesis not performed. Electronically Signed   By: Marcello Moores  Register   On: 05/29/2019 14:05        Scheduled Meds: . aspirin EC  81 mg Oral Daily  . furosemide  20 mg Oral Daily  . lactulose  20 g Oral Daily  . pantoprazole  40 mg Oral Daily  . pravastatin  20 mg Oral q1800  . spironolactone  25 mg Oral Daily   Continuous Infusions:    LOS: 2 days    Time spent: 35 minutes    Sidney Ace, MD [facility] Pager [phone]  If 7PM-7AM, please contact night-coverage 05/31/2019, 12:54 PM

## 2019-06-01 DIAGNOSIS — R2681 Unsteadiness on feet: Secondary | ICD-10-CM | POA: Diagnosis not present

## 2019-06-01 DIAGNOSIS — E785 Hyperlipidemia, unspecified: Secondary | ICD-10-CM | POA: Diagnosis not present

## 2019-06-01 DIAGNOSIS — Z743 Need for continuous supervision: Secondary | ICD-10-CM | POA: Diagnosis not present

## 2019-06-01 DIAGNOSIS — I2582 Chronic total occlusion of coronary artery: Secondary | ICD-10-CM | POA: Diagnosis not present

## 2019-06-01 DIAGNOSIS — K7469 Other cirrhosis of liver: Secondary | ICD-10-CM | POA: Diagnosis not present

## 2019-06-01 DIAGNOSIS — N1831 Chronic kidney disease, stage 3a: Secondary | ICD-10-CM | POA: Diagnosis not present

## 2019-06-01 DIAGNOSIS — R319 Hematuria, unspecified: Secondary | ICD-10-CM | POA: Diagnosis not present

## 2019-06-01 DIAGNOSIS — N179 Acute kidney failure, unspecified: Secondary | ICD-10-CM | POA: Diagnosis not present

## 2019-06-01 DIAGNOSIS — Z7401 Bed confinement status: Secondary | ICD-10-CM | POA: Diagnosis not present

## 2019-06-01 DIAGNOSIS — I10 Essential (primary) hypertension: Secondary | ICD-10-CM | POA: Diagnosis not present

## 2019-06-01 DIAGNOSIS — D509 Iron deficiency anemia, unspecified: Secondary | ICD-10-CM | POA: Diagnosis not present

## 2019-06-01 DIAGNOSIS — R531 Weakness: Secondary | ICD-10-CM | POA: Diagnosis not present

## 2019-06-01 DIAGNOSIS — M6281 Muscle weakness (generalized): Secondary | ICD-10-CM | POA: Diagnosis not present

## 2019-06-01 DIAGNOSIS — K7460 Unspecified cirrhosis of liver: Secondary | ICD-10-CM | POA: Diagnosis not present

## 2019-06-01 DIAGNOSIS — M2550 Pain in unspecified joint: Secondary | ICD-10-CM | POA: Diagnosis not present

## 2019-06-01 DIAGNOSIS — R6889 Other general symptoms and signs: Secondary | ICD-10-CM | POA: Diagnosis not present

## 2019-06-01 DIAGNOSIS — K219 Gastro-esophageal reflux disease without esophagitis: Secondary | ICD-10-CM | POA: Diagnosis not present

## 2019-06-01 DIAGNOSIS — I959 Hypotension, unspecified: Secondary | ICD-10-CM | POA: Diagnosis not present

## 2019-06-01 DIAGNOSIS — N390 Urinary tract infection, site not specified: Secondary | ICD-10-CM | POA: Diagnosis not present

## 2019-06-01 DIAGNOSIS — N189 Chronic kidney disease, unspecified: Secondary | ICD-10-CM | POA: Diagnosis not present

## 2019-06-01 DIAGNOSIS — D649 Anemia, unspecified: Secondary | ICD-10-CM | POA: Diagnosis not present

## 2019-06-01 LAB — CBC WITH DIFFERENTIAL/PLATELET
Abs Immature Granulocytes: 0.04 10*3/uL (ref 0.00–0.07)
Basophils Absolute: 0 10*3/uL (ref 0.0–0.1)
Basophils Relative: 0 %
Eosinophils Absolute: 0.5 10*3/uL (ref 0.0–0.5)
Eosinophils Relative: 6 %
HCT: 23.3 % — ABNORMAL LOW (ref 36.0–46.0)
Hemoglobin: 8.5 g/dL — ABNORMAL LOW (ref 12.0–15.0)
Immature Granulocytes: 1 %
Lymphocytes Relative: 23 %
Lymphs Abs: 1.9 10*3/uL (ref 0.7–4.0)
MCH: 34 pg (ref 26.0–34.0)
MCHC: 36.5 g/dL — ABNORMAL HIGH (ref 30.0–36.0)
MCV: 93.2 fL (ref 80.0–100.0)
Monocytes Absolute: 0.9 10*3/uL (ref 0.1–1.0)
Monocytes Relative: 11 %
Neutro Abs: 4.9 10*3/uL (ref 1.7–7.7)
Neutrophils Relative %: 59 %
Platelets: 96 10*3/uL — ABNORMAL LOW (ref 150–400)
RBC: 2.5 MIL/uL — ABNORMAL LOW (ref 3.87–5.11)
RDW: 16.4 % — ABNORMAL HIGH (ref 11.5–15.5)
WBC: 8.2 10*3/uL (ref 4.0–10.5)
nRBC: 0 % (ref 0.0–0.2)

## 2019-06-01 LAB — COMPREHENSIVE METABOLIC PANEL
ALT: 32 U/L (ref 0–44)
AST: 63 U/L — ABNORMAL HIGH (ref 15–41)
Albumin: 2.5 g/dL — ABNORMAL LOW (ref 3.5–5.0)
Alkaline Phosphatase: 92 U/L (ref 38–126)
Anion gap: 9 (ref 5–15)
BUN: 22 mg/dL (ref 8–23)
CO2: 19 mmol/L — ABNORMAL LOW (ref 22–32)
Calcium: 8.3 mg/dL — ABNORMAL LOW (ref 8.9–10.3)
Chloride: 106 mmol/L (ref 98–111)
Creatinine, Ser: 1.34 mg/dL — ABNORMAL HIGH (ref 0.44–1.00)
GFR calc Af Amer: 45 mL/min — ABNORMAL LOW (ref >60)
GFR calc non Af Amer: 39 mL/min — ABNORMAL LOW (ref >60)
Glucose, Bld: 82 mg/dL (ref 70–99)
Potassium: 3.5 mmol/L (ref 3.5–5.1)
Sodium: 134 mmol/L — ABNORMAL LOW (ref 135–145)
Total Bilirubin: 2.2 mg/dL — ABNORMAL HIGH (ref 0.3–1.2)
Total Protein: 5.3 g/dL — ABNORMAL LOW (ref 6.5–8.1)

## 2019-06-01 LAB — GLUCOSE, CAPILLARY: Glucose-Capillary: 75 mg/dL (ref 70–99)

## 2019-06-01 MED ORDER — MIRTAZAPINE 7.5 MG PO TABS: 7.5000 mg | ORAL_TABLET | Freq: Every day | ORAL | 0 refills | Status: DC

## 2019-06-01 MED ORDER — SPIRONOLACTONE 25 MG PO TABS: 25.0000 mg | ORAL_TABLET | Freq: Every day | ORAL | Status: DC

## 2019-06-01 MED ORDER — ONDANSETRON HCL 4 MG PO TABS: 4.0000 mg | ORAL_TABLET | Freq: Three times a day (TID) | ORAL | 0 refills | Status: AC | PRN

## 2019-06-01 MED ORDER — ZINC 15 66 MG PO TABS: 66.0000 mg | ORAL_TABLET | Freq: Every day | ORAL | 0 refills | Status: DC

## 2019-06-01 NOTE — TOC Transition Note (Signed)
Transition of Care [facility]) - CM/SW Discharge Note   Patient Details  Name: Shannon Sawyer MRN: [id number] Date of Birth: 04/08/1945  [facility] CM/SW Contact:  Candie Chroman, LCSW Phone Number: 06/01/2019, 1:35 PM   Clinical Narrative: Patient has orders to discharge to [facility] today. RN has already called report. EMS has been set up. No further concerns. CSW signing off.    Final next level of care: Skilled Nursing Facility Barriers to Discharge: Barriers Resolved   Patient Goals and CMS Choice Patient states their goals for this hospitalization and ongoing recovery are:: to get pt stronger   Choice offered to / list presented to : Patient, Spouse, Adult Children  Discharge Placement PASRR number recieved: 05/30/19            Patient chooses bed at: [facility] [place] Patient to be transferred to facility by: EMS Name of family member notified: Kanna Dafoe Patient and family notified of of transfer: 06/01/19  Discharge Plan and Services In-house Referral: Clinical Social Work   Post Acute Care Choice: [place]                               Social Determinants of Health (SDOH) Interventions     Readmission Risk Interventions No flowsheet data found.

## 2019-06-01 NOTE — Discharge Summary (Addendum)
Physician Discharge Summary  Shannon Sawyer WUJ:[id number] DOB: 07-Aug-1945 DOA: 05/29/2019  PCP: Virginia Crews, MD  Admit date: 05/29/2019 Discharge date: 06/01/2019  Admitted From: Home Disposition:  SNF  Recommendations for Outpatient Follow-up:  1. Follow up with PCP in 1-2 weeks 2. Follow-up with hepatology as directed  Home Health: No Equipment/Devices: None Discharge Condition: Stable CODE STATUS: Full Diet recommendation: Heart healthy/fluid restriction 1500 cc a day  Brief/Interim Summary:  Shannon Sawyer a 74 y.o.femalewith medical history significant ofhypertension, hyperlipidemia, GERD, depression with anxiety, tricuspid valve insufficiency, CAD, anemia, carotid artery stenosis, liver cirrhosis-NASH, CKD stage III, thrombocytopenia, pancreatitis, who presents with generalized weakness.  Patient states that she has been having generalized weakness in the past several days, which has worsened today. She normally uses walker to walk around, but has difficulty walking now. No unilateral numbness or tingling to extremities. No facial droop or slurred speech. Patient states he has vomited once earlier, currently no nausea vomiting, abdominal pain. She has abdominal distention. She has loose stool bowel movement since she is taking lactulose. Patient has a mild cough, but no chest pain or shortness breath. No symptoms of UTI. No fever or chills.   3/27: Patient seen and examined.  Reports symptomatic improvement since yesterday.  Per physical therapy evaluation patient still markedly weak.  Recommending home with home health.  Kidney function improved over interval.  On interview patient states that she has not had bowel movement for several days and had 2 loose bowel movements this morning.  3/28: Patient seen and examined.  Mental status improved.  Weakness improved though still very prevalent.  Per nursing staff patient had trouble even pivoting to the  bedside commode.  Physical therapy is recommended home with home health however at this time I do not feel that home would be a safe disposition.  I spoken to the husband.  The patient has sick steps getting up into her house.  She does not qualify for EMS transport and is unable to even enter the house.  Her kidney function is improved but not at baseline.  Patient had abdominal distention today on exam.  Nontender.  Not bothersome to patient.  Mental status at baseline.  3/29: Patient seen and examined.  Strength continues to improve.  Functional status improving.  Kidney function improving.  I had a long discussion with the patient and husband at bedside.  Explained that cirrhosis is a chronic issue with multiple complex components and requires close follow-up with a dedicated specialist.  The husband states that they have a dedicated hepatologist at [facility].  I encouraged him to see that specialist soon after discharge.  He agreed to do so.  Patient complexity and level of care make home on safe discharge plan.  [facility]  Patient will discharge there in stable condition.  Discharge Diagnoses:  Principal Problem:   Acute renal failure superimposed on stage 3a chronic kidney disease (HCC) Active Problems:   Cirrhosis of liver not due to alcohol (HCC)   Essential (primary) hypertension   Anemia, iron deficiency   GERD (gastroesophageal reflux disease)   Thrombocytopenia (HCC)   HLD (hyperlipidemia)   CAD (coronary artery disease)   Generalized weakness   Hyponatremia  Acute renal failure superimposed on stage 3a chronic kidney disease ([place]): Baseline Cre is1.1, pt's Cre is2.27 and BUN 40on admission.  Likely due to prerenal secondary to dehydration and continuation of ARB, diuretics Kidney function improving over interval Abdominal distention likely secondary  to intravenous fluids and holding home diuretics Creatinine 1.65 today, baseline 1 Plan: DC normal  saline Resume home Lasix Resume home spironolactone Continue to hold home Cozaar Daily BMP We will need to monitor for additional 24 hours after restarting home diuretics.  If kidney function remains stable tomorrow patient may be stable for disposition  Generalized weakness Likely multifactorial etiology Patient with poor p.o. intake and potentially progression of underlying chronic liver disease Physical therapy occupational therapy recommended home with home health I spoken to the patient's husband at length.  He states that the patient has 6 steps to enter her home.  At this time she is unable to walk up to 6 steps.  Requested reevaluation by physical therapy.  This patient may be better served in a monitored setting post discharge.  Have notified case management who are currently working on facility.   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 Ammonia level 26.  No hepatic encephalopathy.pt has abdominal distention. She has leukocytosis with WBC 15.3. Possible. Felt unlikely Initially consulted IR, Dr. Jen Mow ordered paracentesis, but abdominal ultrasound showed mild ascites without large pocket for paracentesis. Patient was maintained on Rocephin during hospital course, no fevers noted Lactulose was continued Patient had regular bowel movements Mental status at baseline at time of discharge Recommend continuing lactulose and rifaximin on discharge Follow-up with outpatient hepatologist   Hyponatremia: Na 130. -Urine studies indicative of chronic hyponatremia secondary to liver disease -No acute management necessary Follow-up outpatient hepatologist   HTN Blood pressure soft on presentation Medications were held Lasix and Aldactone restarted on discharge Cozaar remains on hold Patient will need follow-up labs and further discussion regarding need for this medication Blood pressure has been controlled without the addition of  Cozaar   Discharge Instructions  Discharge Instructions    Diet - low sodium heart healthy   Complete by: As directed    Increase activity slowly   Complete by: As directed      Allergies as of 06/01/2019      Reactions   Contrast Media  [iodinated Diagnostic Agents] Rash   Blister rash    Azelastine Swelling   Flonase [fluticasone Propionate] Swelling   Prednisone Swelling   Shellfish Allergy Swelling, Other (See Comments)   Per spouse, determined on allergy test.   Tramadol Nausea Only      Medication List    STOP taking these medications   losartan 25 MG tablet Commonly known as: COZAAR     TAKE these medications   acetaminophen 500 MG tablet Commonly known as: TYLENOL Take 500 mg by mouth every 6 (six) hours as needed.   aspirin EC 81 MG tablet Take 81 mg by mouth daily.   furosemide 20 MG tablet Commonly known as: LASIX Take 20 mg by mouth daily.   lactulose 10 GM/15ML solution Commonly known as: CHRONULAC Take 20 g by mouth daily.   lovastatin 20 MG tablet Commonly known as: MEVACOR TAKE 1 TABLET BY MOUTH EVERY DAY WITH DINNER   mirtazapine 7.5 MG tablet Commonly known as: REMERON Take 1 tablet (7.5 mg total) by mouth at bedtime. What changed:   how much to take  when to take this   nitroGLYCERIN 0.4 MG SL tablet Commonly known as: NITROSTAT Place 0.4 mg under the tongue every 5 (five) minutes as needed for chest pain.   omeprazole 20 MG capsule Commonly known as: PRILOSEC Take 20 mg by mouth daily.   ondansetron 4 MG tablet  Commonly known as: ZOFRAN Take 1 tablet (4 mg total) by mouth every 8 (eight) hours as needed for up to 7 days for nausea or vomiting. What changed:   how much to take  when to take this  reasons to take this   rifaximin 200 MG tablet Commonly known as: XIFAXAN Take 400 mg by mouth daily.   spironolactone 25 MG tablet Commonly known as: ALDACTONE Take 1 tablet (25 mg total) by mouth daily. Start taking on:  June 02, 2019 What changed: when to take this   Zinc 15 66 MG Tabs Generic drug: Zinc Sulfate Take 1 tablet (66 mg total) by mouth daily. Unsure of dose What changed:   medication strength  how much to take       Contact information for follow-up providers    Bacigalupo, Dionne Bucy, MD. Schedule an appointment as soon as possible for a visit in 1 week(s).   Specialty: Family Medicine Contact information: [address] [place] [address] [phone]            Contact information for after-discharge care    Destination    [facility] Preferred SNF .   Service: Skilled Nursing Contact information: [place] [phone]                 Allergies  Allergen Reactions  . Contrast Media  [Iodinated Diagnostic Agents] Rash    Blister rash   . Azelastine Swelling  . Flonase [Fluticasone Propionate] Swelling  . Prednisone Swelling  . Shellfish Allergy Swelling and Other (See Comments)    Per spouse, determined on allergy test.  . Tramadol Nausea Only    Consultations:  None   Procedures/Studies: US Abdomen Limited  Result Date: 05/29/2019 CLINICAL DATA:  Ascites. EXAM: ULTRASOUND ABDOMEN LIMITED COMPARISON:  CT 09/14/2014. FINDINGS: Mild ascites noted. No large pocket of fluid was noted for safe paracentesis. Paracentesis not performed. Follow-up exam can be obtained as needed. IMPRESSION: Mild ascites. No large pocket of fluid was noted for safe paracentesis. Paracentesis not performed. Electronically Signed   By: Marcello Moores  Register   On: 05/29/2019 14:05   DG Chest Portable 1 View  Result Date: 05/29/2019 CLINICAL DATA:  Shortness of breath EXAM: PORTABLE CHEST 1 VIEW COMPARISON:  September 13, 2014 FINDINGS: The heart size and mediastinal contours are within normal limits. Both lungs are clear. Cervical fixation hardware seen. IMPRESSION: No active disease. Electronically Signed   By: Prudencio Pair M.D.   On: 05/29/2019 06:28   (Echo, Carotid, EGD, Colonoscopy, ERCP)    Subjective: Patient seen and examined on day of discharge Husband at bedside No complaints, patient feels well Ambulating with physical therapy in the hallway Stable for discharge to skilled nursing facility  Discharge Exam: Vitals:   06/01/19 0415 06/01/19 1322  BP: (!) 132/54 (!) 138/57  Pulse: 99 (!) 105  Resp: 17 18  Temp: 98.1 F (36.7 C) 98.4 F (36.9 C)  SpO2: 100% 100%   Vitals:   05/31/19 1954 05/31/19 2012 06/01/19 0415 06/01/19 1322  BP: 131/62 127/60 (!) 132/54 (!) 138/57  Pulse: (!) 109 (!) 102 99 (!) 105  Resp: 16 18 17 18   Temp: 98 F (36.7 C) 98.3 F (36.8 C) 98.1 F (36.7 C) 98.4 F (36.9 C)  TempSrc: Oral Oral Oral Oral  SpO2: 100% 97% 100% 100%  Weight:      Height:        General: Pt  is alert, awake, not in acute distress Cardiovascular: RRR, S1/S2 +, no rubs, no gallops Respiratory: CTA bilaterally, no wheezing, no rhonchi Abdominal: Soft, NT, ND, bowel sounds + Extremities: no edema, no cyanosis    The results of significant diagnostics from this hospitalization (including imaging, microbiology, ancillary and laboratory) are listed below for reference.     Microbiology: Recent Results (from the past 240 hour(s))  Respiratory Panel by RT PCR (Flu A&B, Covid) - Nasopharyngeal Swab     Status: None   Collection Time: 05/29/19  7:39 AM   Specimen: Nasopharyngeal Swab  Result Value Ref Range Status   SARS Coronavirus 2 by RT PCR NEGATIVE NEGATIVE Final    Comment: (NOTE) SARS-CoV-2 target nucleic acids are NOT DETECTED. The SARS-CoV-2 RNA is generally detectable in upper respiratoy specimens during the acute phase of infection. The lowest concentration of SARS-CoV-2 viral copies this assay can detect is 131 copies/mL. A negative result does not preclude SARS-Cov-2 infection and should not be used as the sole basis for treatment or other patient management  decisions. A negative result may occur with  improper specimen collection/handling, submission of specimen other than nasopharyngeal swab, presence of viral mutation(s) within the areas targeted by this assay, and inadequate number of viral copies (<131 copies/mL). A negative result must be combined with clinical observations, patient history, and epidemiological information. The expected result is Negative. Fact Sheet for Patients:  PinkCheek.be Fact Sheet for Healthcare Providers:  GravelBags.it This test is not yet ap proved or cleared by the [place] FDA and  has been authorized for detection and/or diagnosis of SARS-CoV-2 by FDA under an Emergency Use Authorization (EUA). This EUA will remain  in effect (meaning this test can be used) for the duration of the COVID-19 declaration under Section 564(b)(1) of the Act, 21 U.S.C. section 360bbb-3(b)(1), unless the authorization is terminated or revoked sooner.    Influenza A by PCR NEGATIVE NEGATIVE Final   Influenza B by PCR NEGATIVE NEGATIVE Final    Comment: (NOTE) The Xpert Xpress SARS-CoV-2/FLU/RSV assay is intended as an aid in  the diagnosis of influenza from Nasopharyngeal swab specimens and  should not be used as a sole basis for treatment. Nasal washings and  aspirates are unacceptable for Xpert Xpress SARS-CoV-2/FLU/RSV  testing. Fact Sheet for Patients: PinkCheek.be Fact Sheet for Healthcare Providers: GravelBags.it This test is not yet approved or cleared by the [place] FDA and  has been authorized for detection and/or diagnosis of SARS-CoV-2 by  FDA under an Emergency Use Authorization (EUA). This EUA will remain  in effect (meaning this test can be used) for the duration of the  Covid-19 declaration under Section 564(b)(1) of the Act, 21  U.S.C. section 360bbb-3(b)(1), unless the authorization  is  terminated or revoked. Performed at [facility], [address] [place], [place] [address]   CULTURE, BLOOD (ROUTINE X 2) w Reflex to ID Panel     Status: None (Preliminary result)   Collection Time: 05/29/19  1:27 PM   Specimen: BLOOD  Result Value Ref Range Status   Specimen Description BLOOD BLOOD RIGHT HAND  Final   Special Requests   Final    BOTTLES DRAWN AEROBIC ONLY Blood Culture results may not be optimal due to an inadequate volume of blood received in culture bottles   Culture   Final    NO GROWTH 3 DAYS Performed at [facility], [address], [place], [place] [address]    Report Status PENDING  Incomplete  CULTURE, BLOOD (ROUTINE X 2) w Reflex to ID Panel     Status: None (Preliminary result)   Collection Time: 05/29/19  2:10 PM   Specimen: BLOOD RIGHT ARM  Result Value Ref Range Status   Specimen Description BLOOD RIGHT ARM  Final   Special Requests   Final    BOTTLES DRAWN AEROBIC AND ANAEROBIC Blood Culture adequate volume   Culture   Final    NO GROWTH 3 DAYS Performed at [facility], [address] [place], [place] [address]    Report Status PENDING  Incomplete     Labs: BNP (last 3 results) No results for input(s): BNP in the last 8760 hours. Basic Metabolic Panel: Recent Labs  Lab 05/29/19 0805 05/30/19 0538 05/31/19 0425 06/01/19 0417  NA 130* 133* 133* 134*  K 3.9 3.8 3.5 3.5  CL 99 105 107 106  CO2 19* 19* 18* 19*  GLUCOSE 103* 84 84 82  BUN 40* 36* 32* 22  CREATININE 2.27* 1.79* 1.65* 1.34*  CALCIUM 9.5 8.8* 8.3* 8.3*  MG 1.7  --   --   --    Liver Function Tests: Recent Labs  Lab 05/29/19 0805 05/30/19 0538 05/31/19 0425 06/01/19 0417  AST 61* 51* 55* 63*  ALT 31 24 27  32  ALKPHOS 158* 99 94 92  BILITOT 2.9* 2.1* 1.8* 2.2*  PROT 6.4* 5.1* 5.0* 5.3*  ALBUMIN 2.7* 2.5* 2.4* 2.5*   Recent Labs  Lab 05/29/19 0805  LIPASE 77*   Recent Labs  Lab 05/29/19 0603  AMMONIA 26    CBC: Recent Labs  Lab 05/29/19 0805 05/30/19 0538 05/31/19 0425 06/01/19 0417  WBC 15.3* 9.6  9.4 7.6 8.2  NEUTROABS 9.9* 5.5 4.1 4.9  HGB 11.4* 8.7*  8.6* 8.1* 8.5*  HCT 31.5* 23.8*  23.8* 23.0* 23.3*  MCV 93.8 93.0  96.4 96.2 93.2  PLT 121* 87*  87* 86* 96*   Cardiac Enzymes: No results for input(s): CKTOTAL, CKMB, CKMBINDEX, TROPONINI in the last 168 hours. BNP: Invalid input(s): POCBNP CBG: Recent Labs  Lab 05/30/19 0735 05/31/19 0741 06/01/19 0800  GLUCAP 79 82 75   D-Dimer No results for input(s): DDIMER in the last 72 hours. Hgb A1c No results for input(s): HGBA1C in the last 72 hours. Lipid Profile No results for input(s): CHOL, HDL, LDLCALC, TRIG, CHOLHDL, LDLDIRECT in the last 72 hours. Thyroid function studies No results for input(s): TSH, T4TOTAL, T3FREE, THYROIDAB in the last 72 hours.  Invalid input(s): FREET3 Anemia work up Recent Labs    05/30/19 1034 05/30/19 1057  VITAMINB12  --  898  FOLATE 12.1  --   FERRITIN 1,043*  --   TIBC 132*  --   IRON 122  --    Urinalysis    Component Value Date/Time   COLORURINE YELLOW (A) 05/29/2019 0603   APPEARANCEUR HAZY (A) 05/29/2019 0603   LABSPEC 1.011 05/29/2019 0603   PHURINE 5.0 05/29/2019 0603   GLUCOSEU NEGATIVE 05/29/2019 0603   HGBUR NEGATIVE 05/29/2019 0603   BILIRUBINUR NEGATIVE 05/29/2019 0603   KETONESUR NEGATIVE 05/29/2019 0603   PROTEINUR NEGATIVE 05/29/2019 0603   NITRITE NEGATIVE 05/29/2019 0603   LEUKOCYTESUR NEGATIVE 05/29/2019 0603   Sepsis Labs Invalid input(s): PROCALCITONIN,  WBC,  LACTICIDVEN Microbiology Recent Results (from the past 240 hour(s))  Respiratory Panel by RT PCR (Flu A&B, Covid) - Nasopharyngeal Swab     Status: None   Collection Time: 05/29/19  7:39 AM   Specimen: Nasopharyngeal Swab  Result Value Ref Range Status   SARS Coronavirus 2 by RT PCR NEGATIVE NEGATIVE Final    Comment: (NOTE) SARS-CoV-2 target nucleic acids are NOT DETECTED. The  SARS-CoV-2 RNA is generally detectable in upper respiratoy specimens during the acute phase of infection. The lowest concentration of SARS-CoV-2 viral copies this assay can detect is 131 copies/mL. A negative result does not preclude SARS-Cov-2 infection and should not be used as the sole basis for treatment or other patient management decisions. A negative result may occur with  improper specimen collection/handling, submission of specimen other than nasopharyngeal swab, presence of viral mutation(s) within the areas targeted by this assay, and inadequate number of viral copies (<131 copies/mL). A negative result must be combined with clinical observations, patient history, and epidemiological information. The expected result is Negative. Fact Sheet for Patients:  PinkCheek.be Fact Sheet for Healthcare Providers:  GravelBags.it This test is not yet ap proved or cleared by the [place] FDA and  has been authorized for detection and/or diagnosis of SARS-CoV-2 by FDA under an Emergency Use Authorization (EUA). This EUA will remain  in effect (meaning this test can be used) for the duration of the COVID-19 declaration under Section 564(b)(1) of the Act, 21 U.S.C. section 360bbb-3(b)(1), unless the authorization is terminated or revoked sooner.    Influenza A by PCR NEGATIVE NEGATIVE Final   Influenza B by PCR NEGATIVE NEGATIVE Final    Comment: (NOTE) The Xpert Xpress SARS-CoV-2/FLU/RSV assay is intended as an aid in  the diagnosis of influenza from Nasopharyngeal swab specimens and  should not be used as a sole basis for treatment. Nasal washings and  aspirates are unacceptable for Xpert Xpress SARS-CoV-2/FLU/RSV  testing. Fact Sheet for Patients: PinkCheek.be Fact Sheet for Healthcare Providers: GravelBags.it This test is not yet approved or cleared by the [place] FDA and  has been authorized for detection and/or diagnosis of SARS-CoV-2 by  FDA under an Emergency Use Authorization (EUA). This EUA will remain  in effect (meaning this test can be used) for the duration of the  Covid-19 declaration under Section 564(b)(1) of the Act, 21  U.S.C. section 360bbb-3(b)(1), unless the authorization is  terminated or revoked. Performed at [facility], [address] [place], [place] [address]   CULTURE, BLOOD (ROUTINE X 2) w Reflex to ID Panel     Status: None (Preliminary result)   Collection Time: 05/29/19  1:27 PM   Specimen: BLOOD  Result Value Ref Range Status   Specimen Description BLOOD BLOOD RIGHT HAND  Final   Special Requests   Final    BOTTLES DRAWN AEROBIC ONLY Blood Culture results may not be optimal due to an inadequate volume of blood received in culture bottles   Culture   Final    NO GROWTH 3 DAYS Performed at [facility], [address], [place], [place] [address]    Report Status PENDING  Incomplete  CULTURE, BLOOD (ROUTINE X 2) w Reflex to ID Panel     Status: None (Preliminary result)   Collection Time: 05/29/19  2:10 PM   Specimen: BLOOD RIGHT ARM  Result Value Ref Range Status   Specimen Description BLOOD RIGHT ARM  Final   Special Requests   Final    BOTTLES DRAWN AEROBIC AND ANAEROBIC Blood Culture adequate volume   Culture   Final    NO GROWTH 3 DAYS Performed at [facility], [address], [place],  [address]    Report Status PENDING  Incomplete     Time coordinating discharge: Over 30 minutes  SIGNED:   Sidney Ace, MD  [facility] 06/01/2019, 4:13 PM Pager   If 7PM-7AM, please contact night-coverage

## 2019-06-01 NOTE — Care Management Important Message (Signed)
Important Message  Patient Details  Name: Shannon Sawyer MRN: [id number] Date of Birth: 1945-03-29   Medicare Important Message Given:  Yes     Dannette Barbara 06/01/2019, 1:17 PM

## 2019-06-01 NOTE — Progress Notes (Signed)
RN called report to [facility]. Report given to Yaakov Guthrie.

## 2019-06-01 NOTE — Progress Notes (Signed)
Physical Therapy Treatment Patient Details Name: Shannon Sawyer MRN: [id number] DOB: 11-13-1945 Today's Date: 06/01/2019    History of Present Illness presented to ER secondary to generalized weakness; admitted for management of acute renal failure superimposed on stage 3 CKD.  Does endorse "slide from bed" prior to admission, but no injury reported.    PT Comments    Patient functional status continues to improve, completing all transfers and gait (200') with RW, close sup.  Continues to demonstrate reciprocal stepping pattern with improving cadence and overall gait speed; intermittent cuing for attention to task.  Single standing rest break throughout distance, HR 131 with gait efforts (returning to baseline 110s with seated rest)  Was able to initiate stair training, completing up/down 3 with bilat UEs on single rail, cga/min assist.  Patient very fearful/anxious of task, requiring extensive encouragement for task throughout.    Follow Up Recommendations  Home health PT;Supervision/Assistance - 24 hour     Equipment Recommendations  Rolling walker with 5" wheels;3in1 (PT)    Recommendations for Other Services       Precautions / Restrictions Precautions Precautions: Fall Restrictions Weight Bearing Restrictions: No    Mobility  Bed Mobility               General bed mobility comments: seated in recliner beginning/end of treatment session  Transfers Overall transfer level: Needs assistance Equipment used: Rolling walker (2 wheeled) Transfers: Sit to/from Stand Sit to Stand: Supervision         General transfer comment: limited recall of hand placement with movement transitions  Ambulation/Gait Ambulation/Gait assistance: Supervision Gait Distance (Feet): 200 Feet Assistive device: Rolling walker (2 wheeled)       General Gait Details: reciprocal stepping pattern with improving cadence and overall gait speed; consistent cuing for attention to task.  Single  standing rest break throughout distance, HR 131 with gait efforts (returning to baseline 110s with seated rest)   Stairs Stairs: Yes Stairs assistance: Min assist   Number of Stairs: 3 General stair comments: bilat UEs on R ascending rail; signficant fear/anxiety related to task, but fair LE strength and control   Wheelchair Mobility    Modified Rankin (Stroke Patients Only)       Balance Overall balance assessment: Needs assistance Sitting-balance support: No upper extremity supported;Feet supported Sitting balance-Leahy Scale: Good     Standing balance support: Bilateral upper extremity supported Standing balance-Leahy Scale: Fair                              Cognition Arousal/Alertness: Awake/alert Behavior During Therapy: WFL for tasks assessed/performed Overall Cognitive Status: Within Functional Limits for tasks assessed                                 General Comments: delayed processing and task initiation      Exercises Other Exercises Other Exercises: Toileting including hygiene, hand washing, sit<>stand x2 Other Exercises: Pt and caregiver educated re: falls prevention, energy conservation, home/routine modifications, DME recommendations    General Comments        Pertinent Vitals/Pain Pain Assessment: No/denies pain Faces Pain Scale: Hurts a little bit Pain Descriptors / Indicators: Aching;Grimacing Pain Intervention(s): Repositioned    Home Living                      Prior Function  PT Goals (current goals can now be found in the care plan section) Acute Rehab PT Goals Patient Stated Goal: To get stronger PT Goal Formulation: With patient Time For Goal Achievement: 06/13/19 Potential to Achieve Goals: Good Progress towards PT goals: Progressing toward goals    Frequency    Min 2X/week      PT Plan Current plan remains appropriate    Co-evaluation              AM-PAC PT "6  Clicks" Mobility   Outcome Measure    Help needed moving from lying on your back to sitting on the side of a flat bed without using bedrails?: None Help needed moving to and from a bed to a chair (including a wheelchair)?: None Help needed standing up from a chair using your arms (e.g., wheelchair or bedside chair)?: None Help needed to walk in hospital room?: None Help needed climbing 3-5 steps with a railing? : A Little 6 Click Score: 19    End of Session Equipment Utilized During Treatment: Gait belt Activity Tolerance: Patient tolerated treatment well Patient left: with call bell/phone within reach;in chair;with chair alarm set;with family/visitor present Nurse Communication: Mobility status PT Visit Diagnosis: Muscle weakness (generalized) (M62.81);Difficulty in walking, not elsewhere classified (R26.2)     Time: 8657-8469 PT Time Calculation (min) (ACUTE ONLY): 29 min  Charges:  $Gait Training: 8-22 mins $Therapeutic Activity: 8-22 mins                       Doniven Vanpatten H. Manson Passey, PT, DPT, NCS 06/01/19, 1:23 PM [phone]

## 2019-06-01 NOTE — Progress Notes (Signed)
Shannon Sawyer  A and O x 4. VSS. Pt tolerating diet well. No complaints of pain or nausea. IV removed intact, prescriptions given. Pt voiced understanding of discharge instructions with no further questions. [facility].     Allergies as of 06/01/2019      Reactions   Contrast Media  [iodinated Diagnostic Agents] Rash   Blister rash    Azelastine Swelling   Flonase [fluticasone Propionate] Swelling   Prednisone Swelling   Shellfish Allergy Swelling, Other (See Comments)   Per spouse, determined on allergy test.   Tramadol Nausea Only      Medication List    STOP taking these medications   losartan 25 MG tablet Commonly known as: COZAAR     TAKE these medications   acetaminophen 500 MG tablet Commonly known as: TYLENOL Take 500 mg by mouth every 6 (six) hours as needed.   aspirin EC 81 MG tablet Take 81 mg by mouth daily.   furosemide 20 MG tablet Commonly known as: LASIX Take 20 mg by mouth daily.   lactulose 10 GM/15ML solution Commonly known as: CHRONULAC Take 20 g by mouth daily.   lovastatin 20 MG tablet Commonly known as: MEVACOR TAKE 1 TABLET BY MOUTH EVERY DAY WITH DINNER   mirtazapine 7.5 MG tablet Commonly known as: REMERON Take by mouth.   nitroGLYCERIN 0.4 MG SL tablet Commonly known as: NITROSTAT Place 0.4 mg under the tongue every 5 (five) minutes as needed for chest pain.   omeprazole 20 MG capsule Commonly known as: PRILOSEC Take 20 mg by mouth daily.   ondansetron 4 MG tablet Commonly known as: ZOFRAN Take by mouth.   rifaximin 200 MG tablet Commonly known as: XIFAXAN Take 400 mg by mouth daily.   spironolactone 25 MG tablet Commonly known as: ALDACTONE Take 1 tablet (25 mg total) by mouth daily. Start taking on: June 02, 2019 What changed: when to take this   ZINC 15 PO Take by mouth daily. Unsure of dose       Vitals:   06/01/19 0415 06/01/19 1322  BP: (!) 132/54 (!) 138/57  Pulse: 99 (!) 105  Resp: 17  18  Temp: 98.1 F (36.7 C) 98.4 F (36.9 C)  SpO2: 100% 100%    Francesco Sor

## 2019-06-01 NOTE — TOC Progression Note (Addendum)
Transition of Care [facility]) - Progression Note    Patient Details  Name: Shannon Sawyer MRN: [id number] Date of Birth: 1945-09-23  Transition of [facility] CM/SW Contact  Candie Chroman, LCSW Phone Number: 06/01/2019, 10:46 AM  Clinical Narrative: Tried calling [place] to check insurance authorization status. There were 22 people in front of CSW in the queue. CSW entered phone number for them to call back.    11:07 am: Insurance authorization approved: [id number]. Left [facility] admission coordinator a message to notify.  11:58 am: No response from [facility] yet. Tried calling admissions coordinator. No answer. Will try again later.  12:21 pm: [facility] is able to accept patient today. Patient, husband, and son aware and agreeable.  Expected Discharge Plan: [place] Barriers to Discharge: Continued Medical Work up  Expected Discharge Plan and Services Expected Discharge Plan: [place] In-house Referral: Clinical Social Work   Post Acute Care Choice: [place] Living arrangements for the past 2 months: Single Family Home                                       Social Determinants of Health (SDOH) Interventions    Readmission Risk Interventions No flowsheet data found.

## 2019-06-01 NOTE — Discharge Instructions (Signed)
Cirrhosis  Cirrhosis is long-term (chronic) liver injury. The liver is the body's largest internal organ, and it performs many functions. It converts food into energy, removes toxic material from the blood, makes important proteins, and absorbs necessary vitamins from food. In cirrhosis, healthy liver cells are replaced by scar tissue. This prevents blood from flowing through the liver, making it difficult for the liver to function. Scarring of the liver cannot be reversed, but treatment can prevent it from getting worse. What are the causes? Common causes of this condition are hepatitis C and long-term alcohol abuse. Other causes include:  Nonalcoholic fatty liver disease. This happens when fat is deposited in the liver by causes other than alcohol.  Hepatitis B infection.  Autoimmune hepatitis. In this condition, the body's defense system (immune system) mistakenly attacks the liver cells, causing irritation and swelling (inflammation).  Diseases that cause blockage of ducts inside the liver.  Inherited liver diseases, such as hemochromatosis. This is one of the most common inherited liver diseases. In this disease, deposits of iron collect in the liver and other organs.  Reactions to certain long-term medicines, such as amiodarone, a heart medicine.  Parasitic infections. These include schistosomiasis, which is caused by a flatworm.  Long-term contact to certain toxins. These toxins include certain organic solvents, such as toluene and chloroform. What increases the risk? You are more likely to develop this condition if:  You have certain types of viral hepatitis.  You abuse alcohol, especially if you are female.  You are overweight.  You share needles.  You have unprotected sex with someone who has viral hepatitis. What are the signs or symptoms? You may not have any signs and symptoms at first. Symptoms may not develop until the damage to your liver starts to get worse. Early  symptoms may include:  Weakness and tiredness (fatigue).  Changes in sleep patterns or having trouble sleeping.  Itchiness.  Tenderness in the right-upper part of your abdomen.  Weight loss and muscle loss.  Nausea.  Loss of appetite.  Appearance of tiny blood vessels under the skin. Later symptoms may include:  Fatigue or weakness that is getting worse.  Yellow skin and eyes (jaundice).  Buildup of fluid in the abdomen (ascites). You may notice that your clothes are tight around your waist.  Weight gain.  Swelling of the feet and ankles (edema).  Trouble breathing.  Easy bruising and bleeding.  Vomiting blood.  Black or bloody stool.  Mental confusion. How is this diagnosed? Your health care provider may suspect cirrhosis based on your symptoms and medical history, especially if you have other medical conditions or a history of alcohol abuse. Your health care provider will do a physical exam to feel your liver and to check for signs of cirrhosis. He or she may perform other tests, including:  Blood tests to check: ? For hepatitis B or C. ? Kidney function. ? Liver function.  Imaging tests such as: ? MRI or CT scan to look for changes seen in advanced cirrhosis. ? Ultrasound to see if normal liver tissue is being replaced by scar tissue.  A procedure in which a long needle is used to take a sample of liver tissue to be checked in a lab (biopsy). Liver biopsy can confirm the diagnosis of cirrhosis. How is this treated? Treatment for this condition depends on how damaged your liver is and what caused the damage. It may include treating the symptoms of cirrhosis, or treating the underlying causes in order to  slow the damage. Treatment may include:  Making lifestyle changes, such as: ? Eating a healthy diet. You may need to work with your health care provider or a diet and nutrition specialist (dietitian) to develop an eating plan. ? Restricting salt  intake. ? Maintaining a healthy weight. ? Not abusing drugs or alcohol.  Taking medicines to: ? Treat liver infections or other infections. ? Control itching. ? Reduce fluid buildup. ? Reduce certain blood toxins. ? Reduce risk of bleeding from enlarged blood vessels in the stomach or esophagus (varices).  Liver transplant. In this procedure, a liver from a donor is used to replace your diseased liver. This is done if cirrhosis has caused liver failure. Other treatments and procedures may be done depending on the problems that you get from cirrhosis. Common problems include liver-related kidney failure (hepatorenal syndrome). Follow these instructions at home:   Take medicines only as told by your health care provider. Do not use medicines that are toxic to your liver. Ask your health care provider before taking any new medicines, including over-the-counter medicines.  Rest as needed.  Eat a well-balanced diet. Ask your health care provider or dietitian for more information.  Limit your salt or water intake, if your health care provider asks you to do this.  Do not drink alcohol. This is especially important if you are taking acetaminophen.  Keep all follow-up visits as told by your health care provider. This is important. Contact a health care provider if you:  Have fatigue or weakness that is getting worse.  Develop swelling of the hands, feet, legs, or face.  Have a fever.  Develop loss of appetite.  Have nausea or vomiting.  Develop jaundice.  Develop easy bruising or bleeding. Get help right away if you:  Vomit bright red blood or a material that looks like coffee grounds.  Have blood in your stools.  Notice that your stools appear black and tarry.  Become confused.  Have chest pain or trouble breathing. Summary  Cirrhosis is chronic liver injury. Liver damage cannot be reversed. Common causes are hepatitis C and long-term alcohol abuse.  Tests used to  diagnose cirrhosis include blood tests, imaging tests, and liver biopsy.  Treatment for this condition involves treating the underlying cause. Avoid alcohol, drugs, salt, and medicines that may damage your liver.  Contact your health care provider if you develop ascites, edema, jaundice, fever, nausea or vomiting, easy bruising or bleeding, or worsening fatigue. This information is not intended to replace advice given to you by your health care provider. Make sure you discuss any questions you have with your health care provider. Document Revised: 06/11/2018 Document Reviewed: 01/09/2017 Elsevier Patient Education  [place].

## 2019-06-01 NOTE — Progress Notes (Signed)
Occupational Therapy Treatment Patient Details Name: Shannon Sawyer MRN: [id number] DOB: Shannon Sawyer 21, 1947 Today's Date: 06/01/2019    History of present illness presented to ER secondary to generalized weakness; admitted for management of acute renal failure superimposed on stage 3 CKD.  Does endorse "slide from bed" prior to admission, but no injury reported.   OT comments  [name] was seen for OT treatment on this date. Upon arrival to room pt seated upright in chair with family present.  Pt and family instructed in falls prevention, energy conservation, home/routine modifications, and DME recommendations. Requiring SBA + RW for toileting at standard commode c MAX A + RWfor perihygiene in standing. Pt verbalized understanding of instruction provided. Pt making good progress toward goals. Pt continues to benefit from skilled OT services to maximize return to PLOF and minimize risk of future falls, injury, caregiver burden, and readmission. Will continue to follow POC. Discharge recommendation remains appropriate.    Follow Up Recommendations  SNF    Equipment Recommendations       Recommendations for Other Services      Precautions / Restrictions Precautions Precautions: Fall Restrictions Weight Bearing Restrictions: No       Mobility Bed Mobility               General bed mobility comments: Received and left in chair  Transfers Overall transfer level: Needs assistance Equipment used: Rolling walker (2 wheeled) Transfers: Sit to/from Stand Sit to Stand: Min guard;Min assist         General transfer comment: MIN A sit<>stand from toilet, CGA sit<>stand from chair    Balance Overall balance assessment: Needs assistance Sitting-balance support: No upper extremity supported;Feet supported Sitting balance-Leahy Scale: Good     Standing balance support: No upper extremity supported;During functional activity Standing balance-Leahy Scale: Fair                              ADL either performed or assessed with clinical judgement   ADL Overall ADL's : Needs assistance/impaired                                       General ADL Comments: SBA + RW toileting at regular commode c MAX A + RW for perihygiene in standing. CGA + RW washing hands standing sinkside.      Vision       Perception     Praxis      Cognition Arousal/Alertness: Awake/alert Behavior During Therapy: WFL for tasks assessed/performed Overall Cognitive Status: Within Functional Limits for tasks assessed                                 General Comments: delayed processing and task initiation        Exercises Other Exercises Other Exercises: Toileting including hygiene, hand washing, sit<>stand x2 Other Exercises: Pt and caregiver educated re: falls prevention, energy conservation, home/routine modifications, DME recommendations   Shoulder Instructions       General Comments      Pertinent Vitals/ Pain       Pain Assessment: No/denies pain Faces Pain Scale: Hurts a little bit Pain Descriptors / Indicators: Aching;Grimacing Pain Intervention(s): Repositioned  Home Living  Prior Functioning/Environment              Frequency  Min 2X/week        Progress Toward Goals  OT Goals(current goals can now be found in the care plan section)  Progress towards OT goals: Progressing toward goals  Acute Rehab OT Goals Patient Stated Goal: To get stronger OT Goal Formulation: With patient Time For Goal Achievement: 06/13/19 Potential to Achieve Goals: Good ADL Goals Pt Will Perform Eating: Independently;sitting Pt Will Transfer to Toilet: with min guard assist;bedside commode(c LRAD PRN) Pt Will Perform Toileting - Clothing Manipulation and hygiene: with min assist;sit to/from stand(c LRAD & AE PRN)  Plan Discharge plan remains appropriate;Frequency remains  appropriate    Co-evaluation                 AM-PAC OT "6 Clicks" Daily Activity     Outcome Measure   Help from another person eating meals?: A Little Help from another person taking care of personal grooming?: None Help from another person toileting, which includes using toliet, bedpan, or urinal?: A Lot Help from another person bathing (including washing, rinsing, drying)?: A Lot Help from another person to put on and taking off regular upper body clothing?: A Little Help from another person to put on and taking off regular lower body clothing?: A Lot 6 Click Score: 16    End of Session Equipment Utilized During Treatment: Rolling walker  OT Visit Diagnosis: Unsteadiness on feet (R26.81);History of falling (Z91.81)   Activity Tolerance Patient tolerated treatment well   Patient Left in chair;with call bell/phone within reach;with chair alarm set;with family/visitor present   Nurse Communication          Time: 2863-8177 OT Time Calculation (min): 19 min  Charges: OT General Charges $OT Visit: 1 Visit OT Treatments $Self Care/Home Management : 8-22 mins  Dessie Coma, M.S. OTR/L  06/01/19, 1:16 PM

## 2019-06-02 ENCOUNTER — Telehealth: Payer: Self-pay

## 2019-06-02 NOTE — Telephone Encounter (Signed)
Mr. Sneeringer advised as below. He reports they will call once Mrs. Erker is out of rehab.

## 2019-06-02 NOTE — Telephone Encounter (Signed)
Patient was recently discharged from the hospital on 06/01/19.  No TCM completed, patient does not qualify for TCM services due to being discharged to a SNF.  Per discharge summary patient needs follow up with PCP. No HFU scheduled at this time. FYI

## 2019-06-02 NOTE — Telephone Encounter (Signed)
We can just inform husband that she will need f/u appt after being discharged from [facility]

## 2019-06-03 LAB — CULTURE, BLOOD (ROUTINE X 2)
Culture: NO GROWTH
Culture: NO GROWTH
Special Requests: ADEQUATE

## 2019-06-05 DIAGNOSIS — N189 Chronic kidney disease, unspecified: Secondary | ICD-10-CM | POA: Diagnosis not present

## 2019-06-05 DIAGNOSIS — M6281 Muscle weakness (generalized): Secondary | ICD-10-CM | POA: Diagnosis not present

## 2019-06-05 DIAGNOSIS — K219 Gastro-esophageal reflux disease without esophagitis: Secondary | ICD-10-CM | POA: Diagnosis not present

## 2019-06-05 DIAGNOSIS — I10 Essential (primary) hypertension: Secondary | ICD-10-CM | POA: Diagnosis not present

## 2019-06-05 DIAGNOSIS — K7469 Other cirrhosis of liver: Secondary | ICD-10-CM | POA: Diagnosis not present

## 2019-06-11 ENCOUNTER — Encounter: Admit: 2019-06-11 | Discharge: 2019-06-12 | Payer: MEDICARE | Attending: Gastroenterology | Primary: Gastroenterology

## 2019-06-11 DIAGNOSIS — I8510 Secondary esophageal varices without bleeding: Secondary | ICD-10-CM | POA: Diagnosis not present

## 2019-06-11 DIAGNOSIS — M6250 Muscle wasting and atrophy, not elsewhere classified, unspecified site: Secondary | ICD-10-CM | POA: Diagnosis not present

## 2019-06-11 DIAGNOSIS — L299 Pruritus, unspecified: Secondary | ICD-10-CM | POA: Diagnosis not present

## 2019-06-11 DIAGNOSIS — K7460 Unspecified cirrhosis of liver: Secondary | ICD-10-CM | POA: Diagnosis not present

## 2019-06-11 DIAGNOSIS — K7290 Hepatic failure, unspecified without coma: Secondary | ICD-10-CM | POA: Diagnosis not present

## 2019-06-11 DIAGNOSIS — K7581 Nonalcoholic steatohepatitis (NASH): Secondary | ICD-10-CM | POA: Diagnosis not present

## 2019-06-11 DIAGNOSIS — R188 Other ascites: Secondary | ICD-10-CM | POA: Diagnosis not present

## 2019-06-11 DIAGNOSIS — M85859 Other specified disorders of bone density and structure, unspecified thigh: Secondary | ICD-10-CM | POA: Diagnosis not present

## 2019-06-12 ENCOUNTER — Telehealth: Payer: Self-pay | Admitting: Family Medicine

## 2019-06-12 DIAGNOSIS — N1831 Chronic kidney disease, stage 3a: Secondary | ICD-10-CM | POA: Diagnosis not present

## 2019-06-12 DIAGNOSIS — I252 Old myocardial infarction: Secondary | ICD-10-CM | POA: Diagnosis not present

## 2019-06-12 DIAGNOSIS — I6529 Occlusion and stenosis of unspecified carotid artery: Secondary | ICD-10-CM | POA: Diagnosis not present

## 2019-06-12 DIAGNOSIS — N179 Acute kidney failure, unspecified: Secondary | ICD-10-CM | POA: Diagnosis not present

## 2019-06-12 DIAGNOSIS — M5030 Other cervical disc degeneration, unspecified cervical region: Secondary | ICD-10-CM | POA: Diagnosis not present

## 2019-06-12 DIAGNOSIS — K5900 Constipation, unspecified: Secondary | ICD-10-CM | POA: Diagnosis not present

## 2019-06-12 DIAGNOSIS — K7581 Nonalcoholic steatohepatitis (NASH): Secondary | ICD-10-CM | POA: Diagnosis not present

## 2019-06-12 DIAGNOSIS — K7460 Unspecified cirrhosis of liver: Secondary | ICD-10-CM | POA: Diagnosis not present

## 2019-06-12 DIAGNOSIS — I2510 Atherosclerotic heart disease of native coronary artery without angina pectoris: Secondary | ICD-10-CM | POA: Diagnosis not present

## 2019-06-12 DIAGNOSIS — I129 Hypertensive chronic kidney disease with stage 1 through stage 4 chronic kidney disease, or unspecified chronic kidney disease: Secondary | ICD-10-CM | POA: Diagnosis not present

## 2019-06-12 DIAGNOSIS — K766 Portal hypertension: Secondary | ICD-10-CM | POA: Diagnosis not present

## 2019-06-12 DIAGNOSIS — I071 Rheumatic tricuspid insufficiency: Secondary | ICD-10-CM | POA: Diagnosis not present

## 2019-06-12 DIAGNOSIS — K858 Other acute pancreatitis without necrosis or infection: Secondary | ICD-10-CM | POA: Diagnosis not present

## 2019-06-12 NOTE — Telephone Encounter (Signed)
Copied from [place] [id number]. Topic: Quick Communication - Home Health Verbal Orders >> Jun 12, 2019  2:25 PM Leward Quan A wrote: Caller/Agency: [place] Number: [phone] [place] To LM Requesting OT/PT/Skilled Nursing/Social Work/Speech Therapy: PT  Frequency: 2 w 4, 1 w 1   Also would like orders for OT evaluation

## 2019-06-15 ENCOUNTER — Encounter: Payer: Self-pay | Admitting: Family Medicine

## 2019-06-15 ENCOUNTER — Ambulatory Visit (INDEPENDENT_AMBULATORY_CARE_PROVIDER_SITE_OTHER): Payer: Medicare Other | Admitting: Family Medicine

## 2019-06-15 ENCOUNTER — Other Ambulatory Visit: Payer: Self-pay

## 2019-06-15 VITALS — BP 131/66 | HR 70 | Temp 97.2°F | Resp 16 | Ht 62.0 in | Wt 163.0 lb

## 2019-06-15 DIAGNOSIS — N179 Acute kidney failure, unspecified: Secondary | ICD-10-CM | POA: Diagnosis not present

## 2019-06-15 DIAGNOSIS — D72829 Elevated white blood cell count, unspecified: Secondary | ICD-10-CM

## 2019-06-15 DIAGNOSIS — N1831 Chronic kidney disease, stage 3a: Secondary | ICD-10-CM

## 2019-06-15 DIAGNOSIS — K7460 Unspecified cirrhosis of liver: Secondary | ICD-10-CM | POA: Diagnosis not present

## 2019-06-15 NOTE — Telephone Encounter (Signed)
Erline Levine advised.

## 2019-06-15 NOTE — Progress Notes (Signed)
Patient: Shannon Sawyer Female    DOB: 01/08/1946   74 y.o.   MRN: [id number] Visit Date: 06/17/2019  Today's Provider: Shirlee Latch, MD   Chief Complaint  Patient presents with  . Follow-up   Subjective:      Follow up Hospitalization  Patient was admitted to [facility] on 05/29/2019 and discharged on 06/01/2019. She was treated for generalized weakness, cirrhosis of liver, Acute Kidney Injury. Treatment for this included labs, EKG, Chest Xray. Telephone follow up was done on 06/02/2019 She reports good compliance with treatment. She reports this condition is Improved.  ------------------------------------------------------------------------------------ She initially presented for generalized weakness over several days with no facial droop or slurred speech.  Significant abdominal distention as well.  Noted to have significant AKI on CKD3.  With elevated WBC, concern for possible spontaneous bacterial peritonitis as well. Was treated with Rocephin.  No fevers.  No abd pain.  Seeing Hepatology and considering regular albumin infusions and IV Lasix to help with extravascular fluid overload and intravascular depletion.  She is taking her medications as prescribed per report.  Her husband reports that she is not eating or drinking much at all and is concerned that this is contributing to the problem.     Office Visit from 06/15/2019 in [facility]  PHQ-9 Total Score  5       Allergies  Allergen Reactions  . Contrast Media  [Iodinated Diagnostic Agents] Rash    Blister rash   . Azelastine Swelling  . Flonase [Fluticasone Propionate] Swelling  . Prednisone Swelling  . Shellfish Allergy Swelling and Other (See Comments)    Per spouse, determined on allergy test.  . Tramadol Nausea Only     Current Outpatient Medications:  .  acetaminophen (TYLENOL) 500 MG tablet, Take 500 mg by mouth every 6 (six) hours as needed. , Disp: , Rfl:  .  aspirin EC 81 MG tablet,  Take 81 mg by mouth daily. , Disp: , Rfl:  .  furosemide (LASIX) 20 MG tablet, Take 20 mg by mouth daily., Disp: , Rfl:  .  lactulose (CHRONULAC) 10 GM/15ML solution, Take 20 g by mouth daily. , Disp: , Rfl:  .  lovastatin (MEVACOR) 20 MG tablet, TAKE 1 TABLET BY MOUTH EVERY DAY WITH DINNER, Disp: , Rfl:  .  mirtazapine (REMERON) 7.5 MG tablet, Take 1 tablet (7.5 mg total) by mouth at bedtime., Disp: 30 tablet, Rfl: 0 .  nitroGLYCERIN (NITROSTAT) 0.4 MG SL tablet, Place 0.4 mg under the tongue every 5 (five) minutes as needed for chest pain. , Disp: , Rfl:  .  omeprazole (PRILOSEC) 20 MG capsule, Take 20 mg by mouth daily. , Disp: , Rfl:  .  rifaximin (XIFAXAN) 200 MG tablet, Take 400 mg by mouth daily., Disp: , Rfl:  .  spironolactone (ALDACTONE) 25 MG tablet, Take 1 tablet (25 mg total) by mouth daily., Disp:  , Rfl:  .  Zinc Sulfate (ZINC 15) 66 MG TABS, Take 1 tablet (66 mg total) by mouth daily. Unsure of dose, Disp: , Rfl: 0  Review of Systems  Constitutional: Negative.   HENT: Negative.   Eyes: Negative.   Respiratory: Negative.   Cardiovascular: Negative.   Gastrointestinal: Negative for abdominal distention.  Endocrine: Negative.   Genitourinary: Negative.   Musculoskeletal: Negative.   Skin: Negative.   Allergic/Immunologic: Negative.   Neurological: Negative.   Hematological: Negative.   Psychiatric/Behavioral: Negative.  Negative for suicidal ideas.    Social History  Tobacco Use  . Smoking status: Former Smoker    Types: Cigarettes  . Smokeless tobacco: Never Used  . Tobacco comment: formerly tried smoking cigarettes, no regular use  Substance Use Topics  . Alcohol use: No      Objective:   BP 131/66   Pulse 70   Temp (!) 97.2 F (36.2 C) (Temporal)   Resp 16   Ht 5\' 2"  (1.575 m)   Wt 163 lb (73.9 kg)   BMI 29.81 kg/m  Vitals:   06/15/19 1621  BP: 131/66  Pulse: 70  Resp: 16  Temp: (!) 97.2 F (36.2 C)  TempSrc: Temporal  Weight: 163 lb (73.9  kg)  Height: 5\' 2"  (1.575 m)  Body mass index is 29.81 kg/m.   Physical Exam Vitals reviewed.  Constitutional:      General: She is not in acute distress.    Appearance: Normal appearance. She is well-developed. She is not diaphoretic.  HENT:     Head: Normocephalic and atraumatic.  Eyes:     General: No scleral icterus.    Conjunctiva/sclera: Conjunctivae normal.  Neck:     Thyroid: No thyromegaly.  Cardiovascular:     Rate and Rhythm: Normal rate and regular rhythm.     Pulses: Normal pulses.     Heart sounds: Normal heart sounds. No murmur.  Pulmonary:     Effort: Pulmonary effort is normal. No respiratory distress.     Breath sounds: Normal breath sounds. No wheezing, rhonchi or rales.  Abdominal:     General: There is distension.     Palpations: There is fluid wave.     Tenderness: There is no abdominal tenderness. There is no guarding or rebound.  Musculoskeletal:     Cervical back: Neck supple.     Right lower leg: No edema.     Left lower leg: No edema.     Comments: No asterixis   Lymphadenopathy:     Cervical: No cervical adenopathy.  Skin:    General: Skin is warm and dry.     Findings: No rash.  Neurological:     Mental Status: She is alert and oriented to person, place, and time. Mental status is at baseline.  Psychiatric:        Mood and Affect: Mood normal.        Behavior: Behavior normal.      Results for orders placed or performed in visit on 06/15/19  Comprehensive metabolic panel  Result Value Ref Range   Glucose 97 65 - 99 mg/dL   BUN 16 8 - 27 mg/dL   Creatinine, Ser 1.09 (H) 0.57 - 1.00 mg/dL   GFR calc non Af Amer 29 (L) >59 mL/min/1.73   GFR calc Af Amer 34 (L) >59 mL/min/1.73   BUN/Creatinine Ratio 9 (L) 12 - 28   Sodium 131 (L) 134 - 144 mmol/L   Potassium 2.9 (L) 3.5 - 5.2 mmol/L   Chloride 97 96 - 106 mmol/L   CO2 17 (L) 20 - 29 mmol/L   Calcium 8.4 (L) 8.7 - 10.3 mg/dL   Total Protein 6.1 6.0 - 8.5 g/dL   Albumin 3.0 (L) 3.7  - 4.7 g/dL   Globulin, Total 3.1 1.5 - 4.5 g/dL   Albumin/Globulin Ratio 1.0 (L) 1.2 - 2.2   Bilirubin Total 4.3 (H) 0.0 - 1.2 mg/dL   Alkaline Phosphatase 207 (H) 39 - 117 IU/L   AST 85 (H) 0 - 40 IU/L   ALT 42 (H) 0 -  32 IU/L  CBC w/Diff/Platelet  Result Value Ref Range   WBC 11.3 (H) 3.4 - 10.8 x10E3/uL   RBC 3.03 (L) 3.77 - 5.28 x10E6/uL   Hemoglobin 9.9 (L) 11.1 - 15.9 g/dL   Hematocrit 16.1 (L) 09.6 - 46.6 %   MCV 95 79 - 97 fL   MCH 32.7 26.6 - 33.0 pg   MCHC 34.5 31.5 - 35.7 g/dL   RDW 04.5 40.9 - 81.1 %   Platelets 110 (L) 150 - 450 x10E3/uL   Neutrophils 62 Not Estab. %   Lymphs 18 Not Estab. %   Monocytes 14 Not Estab. %   Eos 6 Not Estab. %   Basos 0 Not Estab. %   Neutrophils Absolute 6.9 1.4 - 7.0 x10E3/uL   Lymphocytes Absolute 2.0 0.7 - 3.1 x10E3/uL   Monocytes Absolute 1.5 (H) 0.1 - 0.9 x10E3/uL   EOS (ABSOLUTE) 0.7 (H) 0.0 - 0.4 x10E3/uL   Basophils Absolute 0.0 0.0 - 0.2 x10E3/uL   Immature Granulocytes 0 Not Estab. %   Immature Grans (Abs) 0.0 0.0 - 0.1 x10E3/uL       Assessment & Plan    Problem List Items Addressed This Visit      Digestive   Cirrhosis of liver not due to alcohol [facility])    [facility] hepatology She does have abdominal distention with ascites on exam today During hospitalization she is unable to have paracentesis by IR due to inability to find a pocket of fluid that was able to be tapped Lengthy discussion with the patient and her husband regarding extravascular and intravascular fluid shifts and why it is so important for her to get protein in her diet to try to improve her intravascular depletion while minimizing her extravascular fluid overload Continue Lasix and spironolactone at current doses Given recent AKI, hesitant to increase the dosages of these at this time Continue to see hepatology regularly Her hepatic encephalopathy is much improved reviewed, so we will continue Xifaxan and lactulose at current doses      Relevant Orders     Comprehensive metabolic panel (Completed)     Genitourinary   Acute renal failure superimposed on stage 3a chronic kidney disease (HCC) - Primary    Seems to have been related to dehydration leading to intravascular depletion despite extravascular fluid overload in the setting of ascites and lower extremity edema As above, did discuss this in depth with the patient and her husband Encouraged her to stay hydrated, but maintain a maximum intake of 2 L/day Hesitant to increase her diuretics in this situation Recheck metabolic panel      Relevant Orders   Comprehensive metabolic panel (Completed)     Other   Leukocytosis    Unclear if patient really had SBP given her lack of fever and other infectious symptoms She is well-appearing today We will recheck CBC      Relevant Orders   CBC w/Diff/Platelet (Completed)       Return in about 3 months (around 09/14/2019) for chronic disease f/u.   The entirety of the information documented in the History of Present Illness, Review of Systems and Physical Exam were personally obtained by me. Portions of this information were initially documented by [facility], CMA and reviewed by me for thoroughness and accuracy.   Total time spent on today's visit was greater than 45 minutes, including both face-to-face time and nonface-to-face time personally spent on review of chart (labs and imaging), discussing labs and goals, discussing further work-up, treatment  options, referrals to specialist if needed, reviewing outside records of pertinent, answering patient's questions, and coordinating care.    Mianna Iezzi, Marzella Schlein, MD MPH [facility]

## 2019-06-15 NOTE — Patient Instructions (Signed)

## 2019-06-15 NOTE — Telephone Encounter (Signed)
OK for verbals

## 2019-06-16 DIAGNOSIS — I071 Rheumatic tricuspid insufficiency: Secondary | ICD-10-CM | POA: Diagnosis not present

## 2019-06-16 DIAGNOSIS — K7460 Unspecified cirrhosis of liver: Secondary | ICD-10-CM | POA: Diagnosis not present

## 2019-06-16 DIAGNOSIS — N1831 Chronic kidney disease, stage 3a: Secondary | ICD-10-CM | POA: Diagnosis not present

## 2019-06-16 DIAGNOSIS — N179 Acute kidney failure, unspecified: Secondary | ICD-10-CM | POA: Diagnosis not present

## 2019-06-16 DIAGNOSIS — K766 Portal hypertension: Secondary | ICD-10-CM | POA: Diagnosis not present

## 2019-06-16 DIAGNOSIS — I129 Hypertensive chronic kidney disease with stage 1 through stage 4 chronic kidney disease, or unspecified chronic kidney disease: Secondary | ICD-10-CM | POA: Diagnosis not present

## 2019-06-16 DIAGNOSIS — I6529 Occlusion and stenosis of unspecified carotid artery: Secondary | ICD-10-CM | POA: Diagnosis not present

## 2019-06-16 DIAGNOSIS — I2510 Atherosclerotic heart disease of native coronary artery without angina pectoris: Secondary | ICD-10-CM | POA: Diagnosis not present

## 2019-06-16 DIAGNOSIS — M5030 Other cervical disc degeneration, unspecified cervical region: Secondary | ICD-10-CM | POA: Diagnosis not present

## 2019-06-16 DIAGNOSIS — K5900 Constipation, unspecified: Secondary | ICD-10-CM | POA: Diagnosis not present

## 2019-06-16 DIAGNOSIS — K7581 Nonalcoholic steatohepatitis (NASH): Secondary | ICD-10-CM | POA: Diagnosis not present

## 2019-06-16 DIAGNOSIS — I252 Old myocardial infarction: Secondary | ICD-10-CM | POA: Diagnosis not present

## 2019-06-16 DIAGNOSIS — K858 Other acute pancreatitis without necrosis or infection: Secondary | ICD-10-CM | POA: Diagnosis not present

## 2019-06-16 DIAGNOSIS — D72829 Elevated white blood cell count, unspecified: Secondary | ICD-10-CM | POA: Diagnosis not present

## 2019-06-17 ENCOUNTER — Telehealth: Payer: Self-pay

## 2019-06-17 DIAGNOSIS — D72829 Elevated white blood cell count, unspecified: Secondary | ICD-10-CM | POA: Insufficient documentation

## 2019-06-17 LAB — COMPREHENSIVE METABOLIC PANEL
ALT: 42 IU/L — ABNORMAL HIGH (ref 0–32)
AST: 85 IU/L — ABNORMAL HIGH (ref 0–40)
Albumin/Globulin Ratio: 1 — ABNORMAL LOW (ref 1.2–2.2)
Albumin: 3 g/dL — ABNORMAL LOW (ref 3.7–4.7)
Alkaline Phosphatase: 207 IU/L — ABNORMAL HIGH (ref 39–117)
BUN/Creatinine Ratio: 9 — ABNORMAL LOW (ref 12–28)
BUN: 16 mg/dL (ref 8–27)
Bilirubin Total: 4.3 mg/dL — ABNORMAL HIGH (ref 0.0–1.2)
CO2: 17 mmol/L — ABNORMAL LOW (ref 20–29)
Calcium: 8.4 mg/dL — ABNORMAL LOW (ref 8.7–10.3)
Chloride: 97 mmol/L (ref 96–106)
Creatinine, Ser: 1.71 mg/dL — ABNORMAL HIGH (ref 0.57–1.00)
GFR calc Af Amer: 34 mL/min/{1.73_m2} — ABNORMAL LOW (ref >59)
GFR calc non Af Amer: 29 mL/min/{1.73_m2} — ABNORMAL LOW (ref >59)
Globulin, Total: 3.1 g/dL (ref 1.5–4.5)
Glucose: 97 mg/dL (ref 65–99)
Potassium: 2.9 mmol/L — ABNORMAL LOW (ref 3.5–5.2)
Sodium: 131 mmol/L — ABNORMAL LOW (ref 134–144)
Total Protein: 6.1 g/dL (ref 6.0–8.5)

## 2019-06-17 LAB — CBC WITH DIFFERENTIAL/PLATELET
Basophils Absolute: 0 10*3/uL (ref 0.0–0.2)
Basos: 0 %
EOS (ABSOLUTE): 0.7 10*3/uL — ABNORMAL HIGH (ref 0.0–0.4)
Eos: 6 %
Hematocrit: 28.7 % — ABNORMAL LOW (ref 34.0–46.6)
Hemoglobin: 9.9 g/dL — ABNORMAL LOW (ref 11.1–15.9)
Immature Grans (Abs): 0 10*3/uL (ref 0.0–0.1)
Immature Granulocytes: 0 %
Lymphocytes Absolute: 2 10*3/uL (ref 0.7–3.1)
Lymphs: 18 %
MCH: 32.7 pg (ref 26.6–33.0)
MCHC: 34.5 g/dL (ref 31.5–35.7)
MCV: 95 fL (ref 79–97)
Monocytes Absolute: 1.5 10*3/uL — ABNORMAL HIGH (ref 0.1–0.9)
Monocytes: 14 %
Neutrophils Absolute: 6.9 10*3/uL (ref 1.4–7.0)
Neutrophils: 62 %
Platelets: 110 10*3/uL — ABNORMAL LOW (ref 150–450)
RBC: 3.03 x10E6/uL — ABNORMAL LOW (ref 3.77–5.28)
RDW: 15 % (ref 11.7–15.4)
WBC: 11.3 10*3/uL — ABNORMAL HIGH (ref 3.4–10.8)

## 2019-06-17 MED ORDER — MIRTAZAPINE 7.5 MG TABLET
ORAL_TABLET | 2 refills | 0 days | Status: CP
Start: 2019-06-17 — End: ?

## 2019-06-17 NOTE — Telephone Encounter (Signed)
-----   Message from Virginia Crews, MD sent at 06/17/2019  8:35 AM EDT ----- Kidney function is stable, but not back to baseline.  Liver function is up as well.  WBC count is high again somewhat.  Need to take in the protein and stay hydrated as we discussed.  Labs need to be rechecked in 1 week.  If develops fever or worsening swelling in abdomen or pain in abdomen, may need to go to ER

## 2019-06-17 NOTE — Assessment & Plan Note (Signed)
Unclear if patient really had SBP given her lack of fever and other infectious symptoms She is well-appearing today We will recheck CBC

## 2019-06-17 NOTE — Assessment & Plan Note (Addendum)
[facility] hepatology She does have abdominal distention with ascites on exam today During hospitalization she is unable to have paracentesis by IR due to inability to find a pocket of fluid that was able to be tapped Lengthy discussion with the patient and her husband regarding extravascular and intravascular fluid shifts and why it is so important for her to get protein in her diet to try to improve her intravascular depletion while minimizing her extravascular fluid overload Continue Lasix and spironolactone at current doses Given recent AKI, hesitant to increase the dosages of these at this time Continue to see hepatology regularly Her hepatic encephalopathy is much improved reviewed, so we will continue Xifaxan and lactulose at current doses

## 2019-06-17 NOTE — Assessment & Plan Note (Signed)
Seems to have been related to dehydration leading to intravascular depletion despite extravascular fluid overload in the setting of ascites and lower extremity edema As above, did discuss this in depth with the patient and her husband Encouraged her to stay hydrated, but maintain a maximum intake of 2 L/day Hesitant to increase her diuretics in this situation Recheck metabolic panel

## 2019-06-17 NOTE — Telephone Encounter (Signed)
LMTCB PEC may give results

## 2019-06-18 DIAGNOSIS — N1831 Chronic kidney disease, stage 3a: Secondary | ICD-10-CM | POA: Diagnosis not present

## 2019-06-18 DIAGNOSIS — I252 Old myocardial infarction: Secondary | ICD-10-CM | POA: Diagnosis not present

## 2019-06-18 DIAGNOSIS — N179 Acute kidney failure, unspecified: Secondary | ICD-10-CM | POA: Diagnosis not present

## 2019-06-18 DIAGNOSIS — K858 Other acute pancreatitis without necrosis or infection: Secondary | ICD-10-CM | POA: Diagnosis not present

## 2019-06-18 DIAGNOSIS — I6529 Occlusion and stenosis of unspecified carotid artery: Secondary | ICD-10-CM | POA: Diagnosis not present

## 2019-06-18 DIAGNOSIS — K7581 Nonalcoholic steatohepatitis (NASH): Secondary | ICD-10-CM | POA: Diagnosis not present

## 2019-06-18 DIAGNOSIS — K5900 Constipation, unspecified: Secondary | ICD-10-CM | POA: Diagnosis not present

## 2019-06-18 DIAGNOSIS — I129 Hypertensive chronic kidney disease with stage 1 through stage 4 chronic kidney disease, or unspecified chronic kidney disease: Secondary | ICD-10-CM | POA: Diagnosis not present

## 2019-06-18 DIAGNOSIS — M5030 Other cervical disc degeneration, unspecified cervical region: Secondary | ICD-10-CM | POA: Diagnosis not present

## 2019-06-18 DIAGNOSIS — I2510 Atherosclerotic heart disease of native coronary artery without angina pectoris: Secondary | ICD-10-CM | POA: Diagnosis not present

## 2019-06-18 DIAGNOSIS — I071 Rheumatic tricuspid insufficiency: Secondary | ICD-10-CM | POA: Diagnosis not present

## 2019-06-18 DIAGNOSIS — K766 Portal hypertension: Secondary | ICD-10-CM | POA: Diagnosis not present

## 2019-06-18 DIAGNOSIS — K7460 Unspecified cirrhosis of liver: Secondary | ICD-10-CM | POA: Diagnosis not present

## 2019-06-18 NOTE — Telephone Encounter (Signed)
Please advise 

## 2019-06-18 NOTE — Telephone Encounter (Signed)
Advised 

## 2019-06-18 NOTE — Telephone Encounter (Signed)
Patient called back- spoke to patient/husband- notified of lab results and recommendations. Patient's husband states swelling in legs went down some with elevation yesterday.( No swelling in hands, fever, abdominal pain) Reports patient has started cough last night and today- dry bothersome- no sinus symptoms/congestion. Has appointment with specialist next week- ? If labs will be drawn there- will call and see. Request advisement on cough.

## 2019-06-18 NOTE — Telephone Encounter (Signed)
Attempted to call pt.  Left vm to return call to office re: recent lab work.

## 2019-06-18 NOTE — Telephone Encounter (Signed)
Advise OTC cough medication like Delsym and getting COVID test

## 2019-06-19 ENCOUNTER — Telehealth: Payer: Self-pay | Admitting: Family Medicine

## 2019-06-19 DIAGNOSIS — I252 Old myocardial infarction: Secondary | ICD-10-CM | POA: Diagnosis not present

## 2019-06-19 DIAGNOSIS — K7460 Unspecified cirrhosis of liver: Secondary | ICD-10-CM | POA: Diagnosis not present

## 2019-06-19 DIAGNOSIS — K766 Portal hypertension: Secondary | ICD-10-CM | POA: Diagnosis not present

## 2019-06-19 DIAGNOSIS — Z03818 Encounter for observation for suspected exposure to other biological agents ruled out: Secondary | ICD-10-CM | POA: Diagnosis not present

## 2019-06-19 DIAGNOSIS — I2510 Atherosclerotic heart disease of native coronary artery without angina pectoris: Secondary | ICD-10-CM | POA: Diagnosis not present

## 2019-06-19 DIAGNOSIS — M5030 Other cervical disc degeneration, unspecified cervical region: Secondary | ICD-10-CM | POA: Diagnosis not present

## 2019-06-19 DIAGNOSIS — K858 Other acute pancreatitis without necrosis or infection: Secondary | ICD-10-CM | POA: Diagnosis not present

## 2019-06-19 DIAGNOSIS — K7581 Nonalcoholic steatohepatitis (NASH): Secondary | ICD-10-CM | POA: Diagnosis not present

## 2019-06-19 DIAGNOSIS — I071 Rheumatic tricuspid insufficiency: Secondary | ICD-10-CM | POA: Diagnosis not present

## 2019-06-19 DIAGNOSIS — K5900 Constipation, unspecified: Secondary | ICD-10-CM | POA: Diagnosis not present

## 2019-06-19 DIAGNOSIS — N1831 Chronic kidney disease, stage 3a: Secondary | ICD-10-CM | POA: Diagnosis not present

## 2019-06-19 DIAGNOSIS — I129 Hypertensive chronic kidney disease with stage 1 through stage 4 chronic kidney disease, or unspecified chronic kidney disease: Secondary | ICD-10-CM | POA: Diagnosis not present

## 2019-06-19 DIAGNOSIS — I6529 Occlusion and stenosis of unspecified carotid artery: Secondary | ICD-10-CM | POA: Diagnosis not present

## 2019-06-19 DIAGNOSIS — N179 Acute kidney failure, unspecified: Secondary | ICD-10-CM | POA: Diagnosis not present

## 2019-06-19 NOTE — Telephone Encounter (Signed)
Shannon Sawyer, OT with [facility], calling to request OT orders.    Frequency: 1w1 and 2w2.    Shannon Sawyer also wanted to note that patient has a slight wheeze but lung sounds are normal.

## 2019-06-19 NOTE — Telephone Encounter (Signed)
Izora Gala advised.   Thanks,   -Mickel Baas

## 2019-06-19 NOTE — Telephone Encounter (Signed)
OK for verbals  Just saw her and lungs are clear.  Had some allergy symptoms. Can take antihistamine

## 2019-06-22 ENCOUNTER — Telehealth: Payer: Self-pay | Admitting: Family Medicine

## 2019-06-22 ENCOUNTER — Ambulatory Visit: Admit: 2019-06-22 | Discharge: 2019-06-27 | Disposition: A | Discharge status: Home Health | Payer: MEDICARE

## 2019-06-22 ENCOUNTER — Encounter: Admit: 2019-06-22 | Discharge: 2019-06-27 | Disposition: A | Discharge status: Home Health | Payer: MEDICARE

## 2019-06-22 DIAGNOSIS — I2721 Secondary pulmonary arterial hypertension: Secondary | ICD-10-CM | POA: Diagnosis not present

## 2019-06-22 DIAGNOSIS — K652 Spontaneous bacterial peritonitis: Secondary | ICD-10-CM | POA: Diagnosis not present

## 2019-06-22 DIAGNOSIS — K7581 Nonalcoholic steatohepatitis (NASH): Secondary | ICD-10-CM | POA: Diagnosis not present

## 2019-06-22 DIAGNOSIS — K5900 Constipation, unspecified: Secondary | ICD-10-CM | POA: Diagnosis not present

## 2019-06-22 DIAGNOSIS — K769 Liver disease, unspecified: Secondary | ICD-10-CM | POA: Diagnosis not present

## 2019-06-22 DIAGNOSIS — K766 Portal hypertension: Secondary | ICD-10-CM | POA: Diagnosis not present

## 2019-06-22 DIAGNOSIS — B954 Other streptococcus as the cause of diseases classified elsewhere: Secondary | ICD-10-CM | POA: Diagnosis not present

## 2019-06-22 DIAGNOSIS — I081 Rheumatic disorders of both mitral and tricuspid valves: Secondary | ICD-10-CM | POA: Diagnosis not present

## 2019-06-22 DIAGNOSIS — N179 Acute kidney failure, unspecified: Secondary | ICD-10-CM | POA: Diagnosis not present

## 2019-06-22 DIAGNOSIS — R410 Disorientation, unspecified: Secondary | ICD-10-CM | POA: Diagnosis not present

## 2019-06-22 DIAGNOSIS — K858 Other acute pancreatitis without necrosis or infection: Secondary | ICD-10-CM | POA: Diagnosis not present

## 2019-06-22 DIAGNOSIS — K7460 Unspecified cirrhosis of liver: Secondary | ICD-10-CM | POA: Diagnosis not present

## 2019-06-22 DIAGNOSIS — R188 Other ascites: Secondary | ICD-10-CM | POA: Diagnosis not present

## 2019-06-22 DIAGNOSIS — E871 Hypo-osmolality and hyponatremia: Secondary | ICD-10-CM | POA: Diagnosis not present

## 2019-06-22 DIAGNOSIS — I252 Old myocardial infarction: Secondary | ICD-10-CM | POA: Diagnosis not present

## 2019-06-22 DIAGNOSIS — Z800 Family history of malignant neoplasm of digestive organs: Secondary | ICD-10-CM | POA: Diagnosis not present

## 2019-06-22 DIAGNOSIS — I2510 Atherosclerotic heart disease of native coronary artery without angina pectoris: Secondary | ICD-10-CM | POA: Diagnosis not present

## 2019-06-22 DIAGNOSIS — K029 Dental caries, unspecified: Secondary | ICD-10-CM | POA: Diagnosis not present

## 2019-06-22 DIAGNOSIS — E785 Hyperlipidemia, unspecified: Secondary | ICD-10-CM | POA: Diagnosis not present

## 2019-06-22 DIAGNOSIS — N1832 Chronic kidney disease, stage 3b: Secondary | ICD-10-CM | POA: Diagnosis not present

## 2019-06-22 DIAGNOSIS — R600 Localized edema: Secondary | ICD-10-CM | POA: Diagnosis not present

## 2019-06-22 DIAGNOSIS — Z20822 Contact with and (suspected) exposure to covid-19: Secondary | ICD-10-CM | POA: Diagnosis not present

## 2019-06-22 DIAGNOSIS — K7469 Other cirrhosis of liver: Secondary | ICD-10-CM | POA: Diagnosis not present

## 2019-06-22 DIAGNOSIS — Z7982 Long term (current) use of aspirin: Secondary | ICD-10-CM | POA: Diagnosis not present

## 2019-06-22 DIAGNOSIS — E876 Hypokalemia: Secondary | ICD-10-CM | POA: Diagnosis not present

## 2019-06-22 DIAGNOSIS — K7689 Other specified diseases of liver: Secondary | ICD-10-CM | POA: Diagnosis not present

## 2019-06-22 DIAGNOSIS — I129 Hypertensive chronic kidney disease with stage 1 through stage 4 chronic kidney disease, or unspecified chronic kidney disease: Secondary | ICD-10-CM | POA: Diagnosis not present

## 2019-06-22 DIAGNOSIS — G9340 Encephalopathy, unspecified: Secondary | ICD-10-CM | POA: Diagnosis not present

## 2019-06-22 DIAGNOSIS — I071 Rheumatic tricuspid insufficiency: Secondary | ICD-10-CM | POA: Diagnosis not present

## 2019-06-22 DIAGNOSIS — Z87891 Personal history of nicotine dependence: Secondary | ICD-10-CM | POA: Diagnosis not present

## 2019-06-22 DIAGNOSIS — N1831 Chronic kidney disease, stage 3a: Secondary | ICD-10-CM | POA: Diagnosis not present

## 2019-06-22 DIAGNOSIS — K7290 Hepatic failure, unspecified without coma: Secondary | ICD-10-CM | POA: Diagnosis not present

## 2019-06-22 DIAGNOSIS — D72829 Elevated white blood cell count, unspecified: Secondary | ICD-10-CM | POA: Diagnosis not present

## 2019-06-22 DIAGNOSIS — K760 Fatty (change of) liver, not elsewhere classified: Secondary | ICD-10-CM | POA: Diagnosis not present

## 2019-06-22 DIAGNOSIS — Z955 Presence of coronary angioplasty implant and graft: Secondary | ICD-10-CM | POA: Diagnosis not present

## 2019-06-22 DIAGNOSIS — K056 Periodontal disease, unspecified: Secondary | ICD-10-CM | POA: Diagnosis not present

## 2019-06-22 DIAGNOSIS — I6529 Occlusion and stenosis of unspecified carotid artery: Secondary | ICD-10-CM | POA: Diagnosis not present

## 2019-06-22 DIAGNOSIS — M5030 Other cervical disc degeneration, unspecified cervical region: Secondary | ICD-10-CM | POA: Diagnosis not present

## 2019-06-22 NOTE — Telephone Encounter (Signed)
Shannon Sawyer, the OT from [facility] called and stated that Pt has a significant amount of fatigue, abdominal swelling and confusion as well as delayed motor execution / today has been worse than normal    She would also like an order a swallowing goal due to chocking when taking pills

## 2019-06-23 DIAGNOSIS — K7290 Hepatic failure, unspecified without coma: Secondary | ICD-10-CM | POA: Insufficient documentation

## 2019-06-23 DIAGNOSIS — N1832 Chronic kidney disease, stage 3b: Secondary | ICD-10-CM | POA: Insufficient documentation

## 2019-06-23 DIAGNOSIS — E876 Hypokalemia: Secondary | ICD-10-CM | POA: Insufficient documentation

## 2019-06-23 DIAGNOSIS — K7460 Unspecified cirrhosis of liver: Secondary | ICD-10-CM | POA: Insufficient documentation

## 2019-06-23 NOTE — Telephone Encounter (Signed)
OK for verbals. Does she mean a swallowing study or bedside swallow challenge?  Need to make sure patient is taking her lactulose and rifaxamin as she will be more cloudy if missing these meds.

## 2019-06-23 NOTE — Telephone Encounter (Signed)
Called and left a detailed message about the verbal order and the medication needs for the patient.

## 2019-06-26 ENCOUNTER — Encounter: Admit: 2019-06-26 | Discharge: 2019-06-27 | Payer: MEDICARE

## 2019-06-27 MED ORDER — PROPRANOLOL 10 MG TABLET
ORAL_TABLET | Freq: Two times a day (BID) | ORAL | 0 refills | 30.00000 days | Status: CP
Start: 2019-06-27 — End: 2019-07-27

## 2019-06-27 MED ORDER — LACTULOSE 10 GRAM/15 ML ORAL SOLUTION
Freq: Four times a day (QID) | ORAL | 0 refills | 30.00000 days | Status: CP
Start: 2019-06-27 — End: 2019-07-27

## 2019-06-27 MED ORDER — ONDANSETRON HCL 4 MG TABLET
ORAL_TABLET | Freq: Three times a day (TID) | ORAL | 5 refills | 10.00000 days | Status: CP | PRN
Start: 2019-06-27 — End: 2019-07-04

## 2019-06-27 MED ORDER — ASPIRIN 81 MG PO CHEW
81.00 | CHEWABLE_TABLET | ORAL | Status: DC
Start: 2019-06-28 — End: 2019-06-27

## 2019-06-27 MED ORDER — PROPRANOLOL HCL 10 MG PO TABS
10.00 | ORAL_TABLET | ORAL | Status: DC
Start: 2019-06-27 — End: 2019-06-27

## 2019-06-27 MED ORDER — MELATONIN 3 MG PO TABS
3.00 | ORAL_TABLET | ORAL | Status: DC
Start: ? — End: 2019-06-27

## 2019-06-27 MED ORDER — POTASSIUM CHLORIDE CRYS ER 10 MEQ PO TBCR
10.00 | EXTENDED_RELEASE_TABLET | ORAL | Status: DC
Start: 2019-06-28 — End: 2019-06-27

## 2019-06-27 MED ORDER — GENERIC EXTERNAL MEDICATION
Status: DC
Start: ? — End: 2019-06-27

## 2019-06-27 MED ORDER — LEVOFLOXACIN 500 MG PO TABS
250.00 | ORAL_TABLET | ORAL | Status: DC
Start: ? — End: 2019-06-27

## 2019-06-27 MED ORDER — PRAVASTATIN SODIUM 40 MG PO TABS
40.00 | ORAL_TABLET | ORAL | Status: DC
Start: 2019-06-27 — End: 2019-06-27

## 2019-06-27 MED ORDER — PANTOPRAZOLE SODIUM 20 MG PO TBEC
20.00 | DELAYED_RELEASE_TABLET | ORAL | Status: DC
Start: 2019-06-28 — End: 2019-06-27

## 2019-06-27 MED ORDER — ACETAMINOPHEN 325 MG PO TABS
650.00 | ORAL_TABLET | ORAL | Status: DC
Start: ? — End: 2019-06-27

## 2019-06-27 MED ORDER — PHYTONADIONE 5 MG PO TABS
10.00 | ORAL_TABLET | ORAL | Status: DC
Start: 2019-06-28 — End: 2019-06-27

## 2019-06-27 MED ORDER — LACTULOSE 10 GM/15ML PO SOLN
30.00 | ORAL | Status: DC
Start: 2019-06-28 — End: 2019-06-27

## 2019-06-27 MED ORDER — RIFAXIMIN 550 MG PO TABS
550.00 | ORAL_TABLET | ORAL | Status: DC
Start: 2019-06-27 — End: 2019-06-27

## 2019-06-28 MED ORDER — LEVOFLOXACIN 250 MG TABLET
ORAL_TABLET | ORAL | 3 refills | 30.00000 days | Status: CP
Start: 2019-06-28 — End: ?

## 2019-06-28 MED ORDER — PHYTONADIONE (VITAMIN K1) 5 MG TABLET
ORAL_TABLET | Freq: Every day | ORAL | 0 refills | 2.00000 days | Status: CP
Start: 2019-06-28 — End: 2019-06-30

## 2019-06-28 MED ORDER — ONDANSETRON HCL 4 MG TABLET
ORAL | 0 days | PRN
Start: 2019-06-28 — End: ?

## 2019-06-29 DIAGNOSIS — K7460 Unspecified cirrhosis of liver: Principal | ICD-10-CM

## 2019-07-06 ENCOUNTER — Encounter: Admit: 2019-07-06 | Discharge: 2019-07-07 | Payer: MEDICARE | Attending: Gastroenterology | Primary: Gastroenterology

## 2019-07-06 DIAGNOSIS — R1310 Dysphagia, unspecified: Secondary | ICD-10-CM | POA: Diagnosis not present

## 2019-07-06 DIAGNOSIS — K7581 Nonalcoholic steatohepatitis (NASH): Secondary | ICD-10-CM | POA: Diagnosis not present

## 2019-07-06 DIAGNOSIS — K7460 Unspecified cirrhosis of liver: Secondary | ICD-10-CM | POA: Diagnosis not present

## 2019-07-06 MED ORDER — AMILORIDE 5 MG TABLET
ORAL_TABLET | Freq: Every day | ORAL | 11 refills | 30.00000 days | Status: CP
Start: 2019-07-06 — End: 2020-07-05

## 2019-07-07 MED ORDER — POTASSIUM CHLORIDE 20 MEQ/15 ML ORAL LIQUID
Freq: Two times a day (BID) | ORAL | 4 refills | 33.00000 days | Status: CP
Start: 2019-07-07 — End: ?

## 2019-07-10 ENCOUNTER — Telehealth: Payer: Self-pay | Admitting: Family Medicine

## 2019-07-10 DIAGNOSIS — I6529 Occlusion and stenosis of unspecified carotid artery: Secondary | ICD-10-CM | POA: Diagnosis not present

## 2019-07-10 DIAGNOSIS — K5900 Constipation, unspecified: Secondary | ICD-10-CM | POA: Diagnosis not present

## 2019-07-10 DIAGNOSIS — I252 Old myocardial infarction: Secondary | ICD-10-CM | POA: Diagnosis not present

## 2019-07-10 DIAGNOSIS — K7460 Unspecified cirrhosis of liver: Secondary | ICD-10-CM | POA: Diagnosis not present

## 2019-07-10 DIAGNOSIS — N1831 Chronic kidney disease, stage 3a: Secondary | ICD-10-CM | POA: Diagnosis not present

## 2019-07-10 DIAGNOSIS — K858 Other acute pancreatitis without necrosis or infection: Secondary | ICD-10-CM | POA: Diagnosis not present

## 2019-07-10 DIAGNOSIS — I2510 Atherosclerotic heart disease of native coronary artery without angina pectoris: Secondary | ICD-10-CM | POA: Diagnosis not present

## 2019-07-10 DIAGNOSIS — N179 Acute kidney failure, unspecified: Secondary | ICD-10-CM | POA: Diagnosis not present

## 2019-07-10 DIAGNOSIS — I129 Hypertensive chronic kidney disease with stage 1 through stage 4 chronic kidney disease, or unspecified chronic kidney disease: Secondary | ICD-10-CM | POA: Diagnosis not present

## 2019-07-10 DIAGNOSIS — M5030 Other cervical disc degeneration, unspecified cervical region: Secondary | ICD-10-CM | POA: Diagnosis not present

## 2019-07-10 DIAGNOSIS — I071 Rheumatic tricuspid insufficiency: Secondary | ICD-10-CM | POA: Diagnosis not present

## 2019-07-10 DIAGNOSIS — K766 Portal hypertension: Secondary | ICD-10-CM | POA: Diagnosis not present

## 2019-07-10 DIAGNOSIS — K7581 Nonalcoholic steatohepatitis (NASH): Secondary | ICD-10-CM | POA: Diagnosis not present

## 2019-07-10 NOTE — Telephone Encounter (Signed)
Stacy given verbal okay for orders.

## 2019-07-10 NOTE — Telephone Encounter (Signed)
Home Health Verbal Orders - Caller/Agency: Stacy with [facility] Number: [phone]  Requesting a Verbal Order for PT. 2 times a week for 4 weeks, Home Health Aid 2 times a week for 3 weeks,  Speech therapy for swallowing concerns,  Pt was coughing a lot and getting strangled with liquids.  Pt also had a fall on Wednesday, left a bruise and abrasion on left hand.

## 2019-07-10 NOTE — Telephone Encounter (Signed)
OK for verbals

## 2019-07-14 ENCOUNTER — Telehealth: Payer: Self-pay | Admitting: Family Medicine

## 2019-07-14 DIAGNOSIS — N179 Acute kidney failure, unspecified: Secondary | ICD-10-CM | POA: Diagnosis not present

## 2019-07-14 DIAGNOSIS — K7460 Unspecified cirrhosis of liver: Secondary | ICD-10-CM | POA: Diagnosis not present

## 2019-07-14 DIAGNOSIS — K766 Portal hypertension: Secondary | ICD-10-CM | POA: Diagnosis not present

## 2019-07-14 DIAGNOSIS — N1831 Chronic kidney disease, stage 3a: Secondary | ICD-10-CM | POA: Diagnosis not present

## 2019-07-14 DIAGNOSIS — I6529 Occlusion and stenosis of unspecified carotid artery: Secondary | ICD-10-CM | POA: Diagnosis not present

## 2019-07-14 DIAGNOSIS — I129 Hypertensive chronic kidney disease with stage 1 through stage 4 chronic kidney disease, or unspecified chronic kidney disease: Secondary | ICD-10-CM | POA: Diagnosis not present

## 2019-07-14 DIAGNOSIS — M5030 Other cervical disc degeneration, unspecified cervical region: Secondary | ICD-10-CM | POA: Diagnosis not present

## 2019-07-14 DIAGNOSIS — I2510 Atherosclerotic heart disease of native coronary artery without angina pectoris: Secondary | ICD-10-CM | POA: Diagnosis not present

## 2019-07-14 DIAGNOSIS — K7581 Nonalcoholic steatohepatitis (NASH): Secondary | ICD-10-CM | POA: Diagnosis not present

## 2019-07-14 DIAGNOSIS — K5900 Constipation, unspecified: Secondary | ICD-10-CM | POA: Diagnosis not present

## 2019-07-14 DIAGNOSIS — K858 Other acute pancreatitis without necrosis or infection: Secondary | ICD-10-CM | POA: Diagnosis not present

## 2019-07-14 DIAGNOSIS — I252 Old myocardial infarction: Secondary | ICD-10-CM | POA: Diagnosis not present

## 2019-07-14 DIAGNOSIS — I071 Rheumatic tricuspid insufficiency: Secondary | ICD-10-CM | POA: Diagnosis not present

## 2019-07-14 NOTE — Telephone Encounter (Signed)
Home Health Verbal Orders - Caller/Agency: Corey Skains Number: [phone] Requesting OT/PT/Skilled Nursing/Social Work/Speech Therapy : Requesting Speech Therapy for cognitive and swallowing   1 x a week for 2 weeks and 2 x week for 2 weeks.

## 2019-07-15 NOTE — Telephone Encounter (Signed)
Verbal okay given to [place].

## 2019-07-16 ENCOUNTER — Encounter: Admit: 2019-07-16 | Discharge: 2019-07-17 | Payer: MEDICARE

## 2019-07-16 DIAGNOSIS — K7290 Hepatic failure, unspecified without coma: Secondary | ICD-10-CM | POA: Diagnosis not present

## 2019-07-16 DIAGNOSIS — K7581 Nonalcoholic steatohepatitis (NASH): Secondary | ICD-10-CM | POA: Diagnosis not present

## 2019-07-16 DIAGNOSIS — K7460 Unspecified cirrhosis of liver: Secondary | ICD-10-CM | POA: Diagnosis not present

## 2019-07-16 DIAGNOSIS — R188 Other ascites: Principal | ICD-10-CM

## 2019-07-17 DIAGNOSIS — K7581 Nonalcoholic steatohepatitis (NASH): Secondary | ICD-10-CM | POA: Diagnosis not present

## 2019-07-17 DIAGNOSIS — M5030 Other cervical disc degeneration, unspecified cervical region: Secondary | ICD-10-CM | POA: Diagnosis not present

## 2019-07-17 DIAGNOSIS — I129 Hypertensive chronic kidney disease with stage 1 through stage 4 chronic kidney disease, or unspecified chronic kidney disease: Secondary | ICD-10-CM | POA: Diagnosis not present

## 2019-07-17 DIAGNOSIS — I252 Old myocardial infarction: Secondary | ICD-10-CM | POA: Diagnosis not present

## 2019-07-17 DIAGNOSIS — I2510 Atherosclerotic heart disease of native coronary artery without angina pectoris: Secondary | ICD-10-CM | POA: Diagnosis not present

## 2019-07-17 DIAGNOSIS — N179 Acute kidney failure, unspecified: Secondary | ICD-10-CM | POA: Diagnosis not present

## 2019-07-17 DIAGNOSIS — K766 Portal hypertension: Secondary | ICD-10-CM | POA: Diagnosis not present

## 2019-07-17 DIAGNOSIS — N1831 Chronic kidney disease, stage 3a: Secondary | ICD-10-CM | POA: Diagnosis not present

## 2019-07-17 DIAGNOSIS — I071 Rheumatic tricuspid insufficiency: Secondary | ICD-10-CM | POA: Diagnosis not present

## 2019-07-17 DIAGNOSIS — I6529 Occlusion and stenosis of unspecified carotid artery: Secondary | ICD-10-CM | POA: Diagnosis not present

## 2019-07-17 DIAGNOSIS — K5900 Constipation, unspecified: Secondary | ICD-10-CM | POA: Diagnosis not present

## 2019-07-17 DIAGNOSIS — K858 Other acute pancreatitis without necrosis or infection: Secondary | ICD-10-CM | POA: Diagnosis not present

## 2019-07-17 DIAGNOSIS — K7460 Unspecified cirrhosis of liver: Secondary | ICD-10-CM | POA: Diagnosis not present

## 2019-07-20 ENCOUNTER — Telehealth: Payer: Self-pay

## 2019-07-20 DIAGNOSIS — M5030 Other cervical disc degeneration, unspecified cervical region: Secondary | ICD-10-CM | POA: Diagnosis not present

## 2019-07-20 DIAGNOSIS — K7581 Nonalcoholic steatohepatitis (NASH): Secondary | ICD-10-CM | POA: Diagnosis not present

## 2019-07-20 DIAGNOSIS — N1831 Chronic kidney disease, stage 3a: Secondary | ICD-10-CM | POA: Diagnosis not present

## 2019-07-20 DIAGNOSIS — I252 Old myocardial infarction: Secondary | ICD-10-CM | POA: Diagnosis not present

## 2019-07-20 DIAGNOSIS — I2510 Atherosclerotic heart disease of native coronary artery without angina pectoris: Secondary | ICD-10-CM | POA: Diagnosis not present

## 2019-07-20 DIAGNOSIS — K858 Other acute pancreatitis without necrosis or infection: Secondary | ICD-10-CM | POA: Diagnosis not present

## 2019-07-20 DIAGNOSIS — I6529 Occlusion and stenosis of unspecified carotid artery: Secondary | ICD-10-CM | POA: Diagnosis not present

## 2019-07-20 DIAGNOSIS — K5900 Constipation, unspecified: Secondary | ICD-10-CM | POA: Diagnosis not present

## 2019-07-20 DIAGNOSIS — K7460 Unspecified cirrhosis of liver: Secondary | ICD-10-CM | POA: Diagnosis not present

## 2019-07-20 DIAGNOSIS — K766 Portal hypertension: Secondary | ICD-10-CM | POA: Diagnosis not present

## 2019-07-20 DIAGNOSIS — I129 Hypertensive chronic kidney disease with stage 1 through stage 4 chronic kidney disease, or unspecified chronic kidney disease: Secondary | ICD-10-CM | POA: Diagnosis not present

## 2019-07-20 DIAGNOSIS — N179 Acute kidney failure, unspecified: Secondary | ICD-10-CM | POA: Diagnosis not present

## 2019-07-20 DIAGNOSIS — I071 Rheumatic tricuspid insufficiency: Secondary | ICD-10-CM | POA: Diagnosis not present

## 2019-07-20 NOTE — Telephone Encounter (Signed)
Left message Izora Gala with OT.   Thanks,   -Mickel Baas

## 2019-07-20 NOTE — Telephone Encounter (Signed)
Copied from [place] [id number]. Topic: General - Other >> Jul 20, 2019 12:30 PM Beverley Fiedler wrote: Izora Gala OT with [facility] is  requesting home health ot 2w2 and then 1w2. Best contact [phone] >> Jul 20, 2019 12:31 PM Izola Price, [place] A wrote: Please change order to 2w2 and 1w1

## 2019-07-20 NOTE — Telephone Encounter (Signed)
OK for verbals

## 2019-07-21 DIAGNOSIS — N179 Acute kidney failure, unspecified: Secondary | ICD-10-CM | POA: Diagnosis not present

## 2019-07-21 DIAGNOSIS — N1831 Chronic kidney disease, stage 3a: Secondary | ICD-10-CM | POA: Diagnosis not present

## 2019-07-21 DIAGNOSIS — K858 Other acute pancreatitis without necrosis or infection: Secondary | ICD-10-CM | POA: Diagnosis not present

## 2019-07-21 DIAGNOSIS — K766 Portal hypertension: Secondary | ICD-10-CM | POA: Diagnosis not present

## 2019-07-21 DIAGNOSIS — I252 Old myocardial infarction: Secondary | ICD-10-CM | POA: Diagnosis not present

## 2019-07-21 DIAGNOSIS — I2510 Atherosclerotic heart disease of native coronary artery without angina pectoris: Secondary | ICD-10-CM | POA: Diagnosis not present

## 2019-07-21 DIAGNOSIS — I071 Rheumatic tricuspid insufficiency: Secondary | ICD-10-CM | POA: Diagnosis not present

## 2019-07-21 DIAGNOSIS — K7581 Nonalcoholic steatohepatitis (NASH): Secondary | ICD-10-CM | POA: Diagnosis not present

## 2019-07-21 DIAGNOSIS — K5900 Constipation, unspecified: Secondary | ICD-10-CM | POA: Diagnosis not present

## 2019-07-21 DIAGNOSIS — I6529 Occlusion and stenosis of unspecified carotid artery: Secondary | ICD-10-CM | POA: Diagnosis not present

## 2019-07-21 DIAGNOSIS — M5030 Other cervical disc degeneration, unspecified cervical region: Secondary | ICD-10-CM | POA: Diagnosis not present

## 2019-07-21 DIAGNOSIS — K7460 Unspecified cirrhosis of liver: Secondary | ICD-10-CM | POA: Diagnosis not present

## 2019-07-21 DIAGNOSIS — I129 Hypertensive chronic kidney disease with stage 1 through stage 4 chronic kidney disease, or unspecified chronic kidney disease: Secondary | ICD-10-CM | POA: Diagnosis not present

## 2019-07-21 MED ORDER — PROPRANOLOL 10 MG TABLET
ORAL_TABLET | Freq: Two times a day (BID) | ORAL | 0 refills | 30 days
Start: 2019-07-21 — End: 2019-08-20

## 2019-07-22 DIAGNOSIS — K7460 Unspecified cirrhosis of liver: Secondary | ICD-10-CM | POA: Diagnosis not present

## 2019-07-22 DIAGNOSIS — K858 Other acute pancreatitis without necrosis or infection: Secondary | ICD-10-CM | POA: Diagnosis not present

## 2019-07-22 DIAGNOSIS — K7581 Nonalcoholic steatohepatitis (NASH): Secondary | ICD-10-CM | POA: Diagnosis not present

## 2019-07-22 DIAGNOSIS — K766 Portal hypertension: Secondary | ICD-10-CM | POA: Diagnosis not present

## 2019-07-22 DIAGNOSIS — N1831 Chronic kidney disease, stage 3a: Secondary | ICD-10-CM | POA: Diagnosis not present

## 2019-07-22 DIAGNOSIS — K5900 Constipation, unspecified: Secondary | ICD-10-CM | POA: Diagnosis not present

## 2019-07-22 DIAGNOSIS — I071 Rheumatic tricuspid insufficiency: Secondary | ICD-10-CM | POA: Diagnosis not present

## 2019-07-22 DIAGNOSIS — I129 Hypertensive chronic kidney disease with stage 1 through stage 4 chronic kidney disease, or unspecified chronic kidney disease: Secondary | ICD-10-CM | POA: Diagnosis not present

## 2019-07-22 DIAGNOSIS — I2510 Atherosclerotic heart disease of native coronary artery without angina pectoris: Secondary | ICD-10-CM | POA: Diagnosis not present

## 2019-07-22 DIAGNOSIS — M5030 Other cervical disc degeneration, unspecified cervical region: Secondary | ICD-10-CM | POA: Diagnosis not present

## 2019-07-22 DIAGNOSIS — I6529 Occlusion and stenosis of unspecified carotid artery: Secondary | ICD-10-CM | POA: Diagnosis not present

## 2019-07-22 DIAGNOSIS — N179 Acute kidney failure, unspecified: Secondary | ICD-10-CM | POA: Diagnosis not present

## 2019-07-22 DIAGNOSIS — I252 Old myocardial infarction: Secondary | ICD-10-CM | POA: Diagnosis not present

## 2019-07-23 DIAGNOSIS — K5900 Constipation, unspecified: Secondary | ICD-10-CM | POA: Diagnosis not present

## 2019-07-23 DIAGNOSIS — I2510 Atherosclerotic heart disease of native coronary artery without angina pectoris: Secondary | ICD-10-CM | POA: Diagnosis not present

## 2019-07-23 DIAGNOSIS — K766 Portal hypertension: Secondary | ICD-10-CM | POA: Diagnosis not present

## 2019-07-23 DIAGNOSIS — N179 Acute kidney failure, unspecified: Secondary | ICD-10-CM | POA: Diagnosis not present

## 2019-07-23 DIAGNOSIS — I6529 Occlusion and stenosis of unspecified carotid artery: Secondary | ICD-10-CM | POA: Diagnosis not present

## 2019-07-23 DIAGNOSIS — I129 Hypertensive chronic kidney disease with stage 1 through stage 4 chronic kidney disease, or unspecified chronic kidney disease: Secondary | ICD-10-CM | POA: Diagnosis not present

## 2019-07-23 DIAGNOSIS — N1831 Chronic kidney disease, stage 3a: Secondary | ICD-10-CM | POA: Diagnosis not present

## 2019-07-23 DIAGNOSIS — M5030 Other cervical disc degeneration, unspecified cervical region: Secondary | ICD-10-CM | POA: Diagnosis not present

## 2019-07-23 DIAGNOSIS — I071 Rheumatic tricuspid insufficiency: Secondary | ICD-10-CM | POA: Diagnosis not present

## 2019-07-23 DIAGNOSIS — K858 Other acute pancreatitis without necrosis or infection: Secondary | ICD-10-CM | POA: Diagnosis not present

## 2019-07-23 DIAGNOSIS — I252 Old myocardial infarction: Secondary | ICD-10-CM | POA: Diagnosis not present

## 2019-07-23 DIAGNOSIS — K7581 Nonalcoholic steatohepatitis (NASH): Secondary | ICD-10-CM | POA: Diagnosis not present

## 2019-07-23 DIAGNOSIS — K7460 Unspecified cirrhosis of liver: Secondary | ICD-10-CM | POA: Diagnosis not present

## 2019-07-24 ENCOUNTER — Telehealth: Payer: Self-pay

## 2019-07-24 DIAGNOSIS — R1310 Dysphagia, unspecified: Secondary | ICD-10-CM

## 2019-07-24 DIAGNOSIS — N1831 Chronic kidney disease, stage 3a: Secondary | ICD-10-CM | POA: Diagnosis not present

## 2019-07-24 DIAGNOSIS — I129 Hypertensive chronic kidney disease with stage 1 through stage 4 chronic kidney disease, or unspecified chronic kidney disease: Secondary | ICD-10-CM | POA: Diagnosis not present

## 2019-07-24 DIAGNOSIS — K766 Portal hypertension: Secondary | ICD-10-CM | POA: Diagnosis not present

## 2019-07-24 DIAGNOSIS — I071 Rheumatic tricuspid insufficiency: Secondary | ICD-10-CM | POA: Diagnosis not present

## 2019-07-24 DIAGNOSIS — M5030 Other cervical disc degeneration, unspecified cervical region: Secondary | ICD-10-CM | POA: Diagnosis not present

## 2019-07-24 DIAGNOSIS — I6529 Occlusion and stenosis of unspecified carotid artery: Secondary | ICD-10-CM | POA: Diagnosis not present

## 2019-07-24 DIAGNOSIS — K858 Other acute pancreatitis without necrosis or infection: Secondary | ICD-10-CM | POA: Diagnosis not present

## 2019-07-24 DIAGNOSIS — K7581 Nonalcoholic steatohepatitis (NASH): Secondary | ICD-10-CM | POA: Diagnosis not present

## 2019-07-24 DIAGNOSIS — N179 Acute kidney failure, unspecified: Secondary | ICD-10-CM | POA: Diagnosis not present

## 2019-07-24 DIAGNOSIS — K7460 Unspecified cirrhosis of liver: Secondary | ICD-10-CM | POA: Diagnosis not present

## 2019-07-24 DIAGNOSIS — I2510 Atherosclerotic heart disease of native coronary artery without angina pectoris: Secondary | ICD-10-CM | POA: Diagnosis not present

## 2019-07-24 DIAGNOSIS — I252 Old myocardial infarction: Secondary | ICD-10-CM | POA: Diagnosis not present

## 2019-07-24 DIAGNOSIS — K5900 Constipation, unspecified: Secondary | ICD-10-CM | POA: Diagnosis not present

## 2019-07-24 DIAGNOSIS — K7290 Hepatic failure, unspecified without coma: Principal | ICD-10-CM

## 2019-07-24 DIAGNOSIS — I8500 Esophageal varices without bleeding: Principal | ICD-10-CM

## 2019-07-24 MED ORDER — PROPRANOLOL 10 MG TABLET
ORAL_TABLET | Freq: Two times a day (BID) | ORAL | 11 refills | 30.00000 days | Status: CP
Start: 2019-07-24 — End: 2019-08-23

## 2019-07-24 NOTE — Telephone Encounter (Signed)
Ok to order 

## 2019-07-24 NOTE — Telephone Encounter (Signed)
Copied from [place] [id number]. Topic: General - Inquiry >> Jul 24, 2019 10:33 AM Percell Belt A wrote: Reason for CRM: Shannon Sawyer with [facility] called and stated that pt was suppose to have a Barium Swallow when she got out [place].  They have tired several times to fu with them to get this done but they never hear back.  They would like to know if PCP would put this order in.   Best number [phone]

## 2019-07-27 DIAGNOSIS — N179 Acute kidney failure, unspecified: Secondary | ICD-10-CM | POA: Diagnosis not present

## 2019-07-27 DIAGNOSIS — K5900 Constipation, unspecified: Secondary | ICD-10-CM | POA: Diagnosis not present

## 2019-07-27 DIAGNOSIS — I252 Old myocardial infarction: Secondary | ICD-10-CM | POA: Diagnosis not present

## 2019-07-27 DIAGNOSIS — N1831 Chronic kidney disease, stage 3a: Secondary | ICD-10-CM | POA: Diagnosis not present

## 2019-07-27 DIAGNOSIS — K766 Portal hypertension: Secondary | ICD-10-CM | POA: Diagnosis not present

## 2019-07-27 DIAGNOSIS — M5030 Other cervical disc degeneration, unspecified cervical region: Secondary | ICD-10-CM | POA: Diagnosis not present

## 2019-07-27 DIAGNOSIS — K7581 Nonalcoholic steatohepatitis (NASH): Secondary | ICD-10-CM | POA: Diagnosis not present

## 2019-07-27 DIAGNOSIS — I2510 Atherosclerotic heart disease of native coronary artery without angina pectoris: Secondary | ICD-10-CM | POA: Diagnosis not present

## 2019-07-27 DIAGNOSIS — I071 Rheumatic tricuspid insufficiency: Secondary | ICD-10-CM | POA: Diagnosis not present

## 2019-07-27 DIAGNOSIS — I129 Hypertensive chronic kidney disease with stage 1 through stage 4 chronic kidney disease, or unspecified chronic kidney disease: Secondary | ICD-10-CM | POA: Diagnosis not present

## 2019-07-27 DIAGNOSIS — K7460 Unspecified cirrhosis of liver: Secondary | ICD-10-CM | POA: Diagnosis not present

## 2019-07-27 DIAGNOSIS — K858 Other acute pancreatitis without necrosis or infection: Secondary | ICD-10-CM | POA: Diagnosis not present

## 2019-07-27 DIAGNOSIS — I6529 Occlusion and stenosis of unspecified carotid artery: Secondary | ICD-10-CM | POA: Diagnosis not present

## 2019-07-28 ENCOUNTER — Ambulatory Visit (INDEPENDENT_AMBULATORY_CARE_PROVIDER_SITE_OTHER): Payer: Medicare Other | Admitting: Family Medicine

## 2019-07-28 ENCOUNTER — Other Ambulatory Visit: Payer: Self-pay | Admitting: Family Medicine

## 2019-07-28 ENCOUNTER — Telehealth: Payer: Self-pay | Admitting: Obstetrics & Gynecology

## 2019-07-28 ENCOUNTER — Other Ambulatory Visit: Payer: Self-pay

## 2019-07-28 ENCOUNTER — Encounter: Payer: Self-pay | Admitting: Family Medicine

## 2019-07-28 VITALS — BP 124/75 | HR 74 | Temp 96.5°F | Wt 158.0 lb

## 2019-07-28 DIAGNOSIS — K089 Disorder of teeth and supporting structures, unspecified: Secondary | ICD-10-CM | POA: Diagnosis not present

## 2019-07-28 DIAGNOSIS — K7460 Unspecified cirrhosis of liver: Secondary | ICD-10-CM | POA: Diagnosis not present

## 2019-07-28 DIAGNOSIS — B351 Tinea unguium: Secondary | ICD-10-CM

## 2019-07-28 DIAGNOSIS — K7290 Hepatic failure, unspecified without coma: Secondary | ICD-10-CM | POA: Diagnosis not present

## 2019-07-28 DIAGNOSIS — K652 Spontaneous bacterial peritonitis: Secondary | ICD-10-CM

## 2019-07-28 DIAGNOSIS — N950 Postmenopausal bleeding: Secondary | ICD-10-CM | POA: Diagnosis not present

## 2019-07-28 DIAGNOSIS — B373 Candidiasis of vulva and vagina: Secondary | ICD-10-CM

## 2019-07-28 DIAGNOSIS — K7682 Hepatic encephalopathy: Secondary | ICD-10-CM

## 2019-07-28 DIAGNOSIS — R1310 Dysphagia, unspecified: Secondary | ICD-10-CM | POA: Insufficient documentation

## 2019-07-28 DIAGNOSIS — B3731 Acute candidiasis of vulva and vagina: Secondary | ICD-10-CM

## 2019-07-28 MED ORDER — MIRTAZAPINE 15 MG PO TABS: 15.0000 mg | ORAL_TABLET | Freq: Every day | ORAL | 1 refills | Status: AC

## 2019-07-28 MED ORDER — FLUCONAZOLE 150 MG PO TABS: 150.0000 mg | ORAL_TABLET | Freq: Once | ORAL | 0 refills | Status: AC

## 2019-07-28 MED ORDER — CLOTRIMAZOLE 3 2 % VA CREA: 1.0000 | TOPICAL_CREAM | Freq: Every day | VAGINAL | 0 refills | Status: DC

## 2019-07-28 NOTE — Telephone Encounter (Signed)
Medication Refill - Medication: clotrimazole (CLOTRIMAZOLE 3) 2 % vaginal cream    Preferred Pharmacy (with phone number or street name):  [id number] Phone:  [phone] Fax:  [phone]      Agent: Please be advised that RX refills may take up to 3 business days. We ask that you follow-up with your pharmacy.

## 2019-07-28 NOTE — Telephone Encounter (Signed)
[facility] referring for Post-menopausal bleeding. Called and left voicemail for patient to call back to be scheduled.

## 2019-07-28 NOTE — Telephone Encounter (Signed)
Patient calling to check the status. She states that this was supposed to be sent at visit today.

## 2019-07-28 NOTE — Telephone Encounter (Signed)
Requested medication (s) are due for refill today:   Yes  Requested medication (s) are on the active medication list:   Yes  Future visit scheduled:   Yes   Last ordered: Today by DrBrita Romp.   It came in as a called in request from the pt.  Pt was seen today.   Requested Prescriptions  Pending Prescriptions Disp Refills   clotrimazole (CLOTRIMAZOLE 3) 2 % vaginal cream 21 g 0    Sig: Place 1 Applicatorful vaginally at bedtime.      Off-Protocol Failed - 07/28/2019 12:24 PM      Failed - Medication not assigned to a protocol, review manually.      Passed - Valid encounter within last 12 months    Recent Outpatient Visits           Today Cirrhosis of liver not due to alcohol [name])   [place], Dionne Bucy, MD   1 month ago Acute renal failure superimposed on stage 3a chronic kidney disease, unspecified acute renal failure type [name])   [name] [place], Dionne Bucy, MD   5 months ago Ataxia   [name], Dionne Bucy, MD   6 months ago Ataxia   [name], Dionne Bucy, MD   11 months ago Encounter for annual physical exam   [name], Dionne Bucy, MD       Future Appointments             In 2 weeks  [name], [place]   In 2 weeks [place], Dionne Bucy, MD [name], [place]   In 3 months [place], Dionne Bucy, MD [name], [place]

## 2019-07-28 NOTE — Progress Notes (Signed)
I,Laura E Walsh,acting as a scribe for Shirlee Latch, MD.,have documented all relevant documentation on the behalf of Shirlee Latch, MD,as directed by  Shirlee Latch, MD while in the presence of Shirlee Latch, MD.    Established patient visit   Patient: Shannon Sawyer   DOB: 02/15/1946   74 y.o. Female  MRN: [id number] Visit Date: 07/28/2019  Today's healthcare provider: Shirlee Latch, MD   Chief Complaint  Patient presents with  . Hospitalization Follow-up   Subjective    HPI  Follow up Hospitalization  Patient was admitted to [facility] on 06/22/2019 and discharged on 06/29/2019. She was treated for Acute hepatic encephalopathy. SBP Treatment for this included IV antibiotic, RX for Levaquin at home. Telephone follow up was not done  She reports excellent compliance with treatment. She reports this condition is improved.  ----------------------------------------------------------------------------------------- - Cognitive function, as well as weakness and gait seem to be improving with home health Getting albumin infusion and IV Lasix push q2 wks with GI Saw GI recently and barium swallow was ordered.  They are calling to have this scheduled but having difficulty  Vaginal bleeding x2 episodes recently.  Never happened before.  Menopause occurred >25 years ago.  Never had this before.  No recent intercourse. No pain, discharge.   Patient Active Problem List   Diagnosis Date Noted  . Vaginal yeast infection 07/29/2019  . Toenail fungus 07/29/2019  . SBP (spontaneous bacterial peritonitis) (HCC) 07/29/2019  . Poor dentition 07/28/2019  . Dysphagia 07/28/2019  . Post-menopausal bleeding 07/28/2019  . Leukocytosis 06/17/2019  . HLD (hyperlipidemia) 05/29/2019  . Anxiety 05/29/2019  . CAD (coronary artery disease) 05/29/2019  . Acute renal failure superimposed on stage 3a chronic kidney disease (HCC) 05/29/2019  . Generalized weakness 05/29/2019  .  Hyponatremia 05/29/2019  . Seasonal allergic rhinitis due to pollen 05/07/2018  . Thrombocytopenia (HCC) 02/03/2018  . H/O acute myocardial infarction 07/11/2017  . GERD (gastroesophageal reflux disease) 07/11/2017  . Bradycardia 11/06/2016  . Severe anxiety with panic 11/15/2015  . Memory change 06/30/2015  . H/O acute pancreatitis 11/18/2014  . Cervical radiculopathy 06/03/2014  . TI (tricuspid incompetence) 02/12/2014  . Carotid artery narrowing 01/12/2014  . Cirrhosis of liver not due to alcohol (HCC) 01/12/2014  . Anemia, iron deficiency 01/12/2014  . Breathlessness on exertion 01/12/2014  . Bilateral carotid artery stenosis 01/12/2014  . Cervical radiculitis 11/04/2013  . DDD (degenerative disc disease), cervical 11/04/2013  . SPL (spondylolisthesis) 11/04/2013  . Arteriosclerosis of coronary artery 05/20/2013  . Essential (primary) hypertension 05/20/2013  . Combined fat and carbohydrate induced hyperlipemia 05/20/2013  . NSTEMI (non-ST elevated myocardial infarction) (HCC) 12/04/2011  . Hyperglycemia 12/04/2011   Past Medical History:  Diagnosis Date  . Allergy   . Anemia    LOW PLATELET COUNTS  ([facility])  . Anxiety   . Carotid artery disease (HCC)    BILAT.        Marland Kitchen Chronic cough   . Cirrhosis, nonalcoholic (HCC)    PORTAL HTN, FATTY TISSUE   . Coronary artery disease   . DDD (degenerative disc disease), cervical    SPONDYLOLISTHESIS  . Depression   . GERD (gastroesophageal reflux disease)   . Headache    HX OCCULAR MIGRAINES  . Hypertension    ESSENTIAL  . Myocardial infarction (HCC)   . Shortness of breath dyspnea    WITH EXERTION   . Tricuspid insufficiency    MODERATE    Past Surgical History:  Procedure Laterality Date  . ABDOMINAL HYSTERECTOMY    . ANTERIOR CERVICAL DECOMP/DISCECTOMY FUSION N/A 06/03/2014   Procedure: CERVICAL FIVE-CERVICAL SIX, CERVICAL SIX-CERVICAL SEVEN ANTERIOR CERVICAL DECOMPRESSION/DISCECTOMY ;  Surgeon:  Aliene Beams, MD;  Location: [facility] NEURO ORS;  Service: Neurosurgery;  Laterality: N/A;  . BREAST BIOPSY Left 09/02/2015   benign  . BREAST CYST ASPIRATION Left    neg  . CARDIAC CATHETERIZATION     2 STENTS 2013 IN [place]  . cardiac stents    . ESOPHAGOGASTRODUODENOSCOPY (EGD) WITH PROPOFOL N/A 02/21/2015   Procedure: ESOPHAGOGASTRODUODENOSCOPY (EGD) WITH PROPOFOL;  Surgeon: Elnita Maxwell, MD;  Location: [facility] ENDOSCOPY;  Service: Endoscopy;  Laterality: N/A;  . EYE SURGERY     Social History   Tobacco Use  . Smoking status: Former Smoker    Types: Cigarettes  . Smokeless tobacco: Never Used  . Tobacco comment: formerly tried smoking cigarettes, no regular use  Substance Use Topics  . Alcohol use: No  . Drug use: No   Allergies  Allergen Reactions  . Contrast Media  [Iodinated Diagnostic Agents] Rash    Blister rash   . Azelastine Swelling  . Flonase [Fluticasone Propionate] Swelling  . Prednisone Swelling  . Shellfish Allergy Swelling and Other (See Comments)    Per spouse, determined on allergy test.  . Tramadol Nausea Only     Medications: Outpatient Medications Prior to Visit  Medication Sig  . acetaminophen (TYLENOL) 500 MG tablet Take 500 mg by mouth every 6 (six) hours as needed.   Marland Kitchen aMILoride (MIDAMOR) 5 MG tablet Take by mouth.  Marland Kitchen aspirin EC 81 MG tablet Take 81 mg by mouth daily.   . furosemide (LASIX) 20 MG tablet Take 20 mg by mouth daily.  Marland Kitchen lactulose (CHRONULAC) 10 GM/15ML solution Take 20 g by mouth daily.   Marland Kitchen levofloxacin (LEVAQUIN) 250 MG tablet Take by mouth.  . lovastatin (MEVACOR) 20 MG tablet TAKE 1 TABLET BY MOUTH EVERY DAY WITH DINNER  . nitroGLYCERIN (NITROSTAT) 0.4 MG SL tablet Place 0.4 mg under the tongue every 5 (five) minutes as needed for chest pain.   Marland Kitchen omeprazole (PRILOSEC) 20 MG capsule Take 20 mg by mouth daily.   . ondansetron (ZOFRAN) 4 MG tablet Take 4 mg by mouth every 8 (eight) hours as needed.  . potassium chloride 20  MEQ/15ML (10%) SOLN Take by mouth.  . propranolol (INDERAL) 10 MG tablet Take by mouth.  . rifaximin (XIFAXAN) 200 MG tablet Take 400 mg by mouth daily.  Marland Kitchen spironolactone (ALDACTONE) 25 MG tablet Take 1 tablet (25 mg total) by mouth daily. (Patient not taking: Reported on 07/28/2019)  . Zinc Sulfate (ZINC 15) 66 MG TABS Take 1 tablet (66 mg total) by mouth daily. Unsure of dose (Patient not taking: Reported on 07/28/2019)  . [DISCONTINUED] mirtazapine (REMERON) 7.5 MG tablet Take 1 tablet (7.5 mg total) by mouth at bedtime.   No facility-administered medications prior to visit.    Review of Systems  Constitutional: Negative.   Respiratory: Negative.   Cardiovascular: Negative.   Gastrointestinal: Positive for diarrhea. Negative for abdominal distention, abdominal pain, anal bleeding, blood in stool, constipation, nausea, rectal pain and vomiting.  Genitourinary: Positive for vaginal bleeding. Negative for decreased urine volume, difficulty urinating, dyspareunia, dysuria, enuresis, flank pain, frequency, genital sores, hematuria, menstrual problem, pelvic pain, urgency, vaginal discharge and vaginal pain.  Neurological: Negative for dizziness, light-headedness and headaches.  Psychiatric/Behavioral: Negative.     Last CBC Lab Results  Component Value Date  WBC 11.3 (H) 06/16/2019   HGB 9.9 (L) 06/16/2019   HCT 28.7 (L) 06/16/2019   MCV 95 06/16/2019   MCH 32.7 06/16/2019   RDW 15.0 06/16/2019   PLT 110 (L) 06/16/2019   Last metabolic panel Lab Results  Component Value Date   GLUCOSE 97 06/16/2019   NA 131 (L) 06/16/2019   K 2.9 (L) 06/16/2019   CL 97 06/16/2019   CO2 17 (L) 06/16/2019   BUN 16 06/16/2019   CREATININE 1.71 (H) 06/16/2019   GFRNONAA 29 (L) 06/16/2019   GFRAA 34 (L) 06/16/2019   CALCIUM 8.4 (L) 06/16/2019   PHOS 3.2 05/07/2018   PROT 6.1 06/16/2019   ALBUMIN 3.0 (L) 06/16/2019   LABGLOB 3.1 06/16/2019   AGRATIO 1.0 (L) 06/16/2019   BILITOT 4.3 (H)  06/16/2019   ALKPHOS 207 (H) 06/16/2019   AST 85 (H) 06/16/2019   ALT 42 (H) 06/16/2019   ANIONGAP 9 06/01/2019   Last lipids Lab Results  Component Value Date   CHOL 156 08/07/2018   HDL 77 08/07/2018   LDLCALC 61 08/07/2018   TRIG 91 08/07/2018   CHOLHDL 2.0 08/07/2018   Last hemoglobin A1c Lab Results  Component Value Date   HGBA1C 5.6 08/07/2018   Last thyroid functions No results found for: TSH, T3TOTAL, T4TOTAL, THYROIDAB Last vitamin D No results found for: 25OHVITD2, 25OHVITD3, VD25OH Last vitamin B12 and Folate Lab Results  Component Value Date   VITAMINB12 898 05/30/2019   FOLATE 12.1 05/30/2019    Objective    BP 124/75 (BP Location: Right Arm, Patient Position: Sitting, Cuff Size: Large)   Pulse 74   Temp (!) 96.5 F (35.8 C) (Temporal)   Wt 158 lb (71.7 kg) Comment: Per Pt  BMI 28.90 kg/m  BP Readings from Last 3 Encounters:  07/28/19 124/75  06/15/19 131/66  06/01/19 (!) 138/57    Physical Exam Vitals and nursing note reviewed. Exam conducted with a chaperone present.  Constitutional:      General: She is not in acute distress.    Appearance: Normal appearance. She is not diaphoretic.  HENT:     Head: Normocephalic and atraumatic.  Cardiovascular:     Rate and Rhythm: Normal rate and regular rhythm.     Heart sounds: Normal heart sounds.  Pulmonary:     Effort: Pulmonary effort is normal. No respiratory distress.     Breath sounds: Normal breath sounds. No wheezing.  Abdominal:     General: Abdomen is protuberant. There is distension.     Tenderness: There is no abdominal tenderness. There is no guarding or rebound.  Genitourinary:    Exam position: Supine.     Pubic Area: Rash (erythema) present.     Labia:        Right: No tenderness or lesion.        Left: No tenderness or lesion.      Vagina: Vaginal discharge (thick, white discharge aroun vagina and vulva) present.  Musculoskeletal:     Cervical back: Neck supple.     Right  lower leg: No edema.     Left lower leg: No edema.  Lymphadenopathy:     Cervical: No cervical adenopathy.  Skin:    General: Skin is warm and dry.  Neurological:     Mental Status: She is alert and oriented to person, place, and time. Mental status is at baseline.     Gait: Gait abnormal (in wheelchair with unsteady gait).  Psychiatric:  Mood and Affect: Mood normal.       No results found for any visits on 07/28/19.  Assessment & Plan     Problem List Items Addressed This Visit      Digestive   Cirrhosis of liver not due to alcohol (HCC) - Primary    Followed by [facility] Hepatology Stable abdominal distention with ascites on exam today Continue albumin infusion with IV Lasix every 2 weeks per GI She is amenable to have a follow-up paracentesis done during her hospitalization stay the inability to find a pocket of fluid that was able to be tapped Continue diuretics Reviewed recent labs Hepatic encephalopathy is much improved, continue Xifaxan and lactulose at current doses      Poor dentition    Concern that caries are a possible source of Strep Mitis infection that caused her SBP that she was treated for during recent hospitalization Stressed importance of seeing dentist Referral placed today      Relevant Orders   Ambulatory referral to Dentistry   Dysphagia    Will follow up with GI regarding barium swallow study        Musculoskeletal and Integument   Toenail fungus    Referral to podiatry per patient request      Relevant Orders   Ambulatory referral to Podiatry     Genitourinary   Vaginal yeast infection    Significant vulvar yeast infection noted on exam today No internal pelvic exam completed due to difficulty with positioning of the patient It is possible that the friability of her vulva could have contributed to the postmenopausal bleeding As below, I did advise her to follow-up with GYN We will treat with fluconazole, with caution due to  cirrhosis, and also clotrimazole cream        Other   Memory change    Hepatic encephalopathy has improved She seems back to baseline today      Post-menopausal bleeding    New problem As above, could be related to her significant vulvar yeast infection causing external bleeding Was unable to perform internal pelvic exam today due to difficulty with positioning of the patient Referral to GYN for further evaluation and management Discussed with family to keep track of this if it happens again      Relevant Orders   Ambulatory referral to Obstetrics / Gynecology   SBP (spontaneous bacterial peritonitis) (HCC)    Reviewed records from hospitalization Culture grew strep mitis As above, discussed with family that the concern is that this could be a dental source of infection Encourage follow-up with dentistry Continue Levaquin until completion of course Continue to follow with GI No further fevers today, but they will monitor for this at home          Return in about 3 months (around 10/28/2019).      Total time spent on today's visit was greater than 45 minutes, including both face-to-face time and nonface-to-face time personally spent on review of chart (labs and imaging), discussing labs and goals, discussing further work-up, treatment options, referrals to specialist if needed, reviewing outside records of pertinent, answering patient's questions, and coordinating care.    I, Shirlee Latch, MD, have reviewed all documentation for this visit. The documentation on 07/29/19 for the exam, diagnosis, procedures, and orders are all accurate and complete.   Susi Goslin, Marzella Schlein, MD, MPH [facility]

## 2019-07-29 DIAGNOSIS — I2510 Atherosclerotic heart disease of native coronary artery without angina pectoris: Secondary | ICD-10-CM | POA: Diagnosis not present

## 2019-07-29 DIAGNOSIS — I6529 Occlusion and stenosis of unspecified carotid artery: Secondary | ICD-10-CM | POA: Diagnosis not present

## 2019-07-29 DIAGNOSIS — K858 Other acute pancreatitis without necrosis or infection: Secondary | ICD-10-CM | POA: Diagnosis not present

## 2019-07-29 DIAGNOSIS — K5900 Constipation, unspecified: Secondary | ICD-10-CM | POA: Diagnosis not present

## 2019-07-29 DIAGNOSIS — K652 Spontaneous bacterial peritonitis: Secondary | ICD-10-CM | POA: Insufficient documentation

## 2019-07-29 DIAGNOSIS — N179 Acute kidney failure, unspecified: Secondary | ICD-10-CM | POA: Diagnosis not present

## 2019-07-29 DIAGNOSIS — M5030 Other cervical disc degeneration, unspecified cervical region: Secondary | ICD-10-CM | POA: Diagnosis not present

## 2019-07-29 DIAGNOSIS — I071 Rheumatic tricuspid insufficiency: Secondary | ICD-10-CM | POA: Diagnosis not present

## 2019-07-29 DIAGNOSIS — I252 Old myocardial infarction: Secondary | ICD-10-CM | POA: Diagnosis not present

## 2019-07-29 DIAGNOSIS — N1831 Chronic kidney disease, stage 3a: Secondary | ICD-10-CM | POA: Diagnosis not present

## 2019-07-29 DIAGNOSIS — K7460 Unspecified cirrhosis of liver: Secondary | ICD-10-CM | POA: Diagnosis not present

## 2019-07-29 DIAGNOSIS — I129 Hypertensive chronic kidney disease with stage 1 through stage 4 chronic kidney disease, or unspecified chronic kidney disease: Secondary | ICD-10-CM | POA: Diagnosis not present

## 2019-07-29 DIAGNOSIS — B3731 Acute candidiasis of vulva and vagina: Secondary | ICD-10-CM | POA: Insufficient documentation

## 2019-07-29 DIAGNOSIS — K7581 Nonalcoholic steatohepatitis (NASH): Secondary | ICD-10-CM | POA: Diagnosis not present

## 2019-07-29 DIAGNOSIS — K766 Portal hypertension: Secondary | ICD-10-CM | POA: Diagnosis not present

## 2019-07-29 DIAGNOSIS — B373 Candidiasis of vulva and vagina: Secondary | ICD-10-CM | POA: Insufficient documentation

## 2019-07-29 DIAGNOSIS — B351 Tinea unguium: Secondary | ICD-10-CM | POA: Insufficient documentation

## 2019-07-29 MED ORDER — CLOTRIMAZOLE 3 2 % VA CREA: 1.0000 | TOPICAL_CREAM | Freq: Every day | VAGINAL | 0 refills | Status: DC

## 2019-07-29 NOTE — Assessment & Plan Note (Signed)
Concern that caries are a possible source of Strep Mitis infection that caused her SBP that she was treated for during recent hospitalization Stressed importance of seeing dentist Referral placed today

## 2019-07-29 NOTE — Assessment & Plan Note (Signed)
Hepatic encephalopathy has improved She seems back to baseline today

## 2019-07-29 NOTE — Assessment & Plan Note (Signed)
Reviewed records from hospitalization Culture grew strep mitis As above, discussed with family that the concern is that this could be a dental source of infection Encourage follow-up with dentistry Continue Levaquin until completion of course Continue to follow with GI No further fevers today, but they will monitor for this at home

## 2019-07-29 NOTE — Telephone Encounter (Signed)
Pt is going to follow up with GI.  See office note 07/28/2019  Thanks,   Mickel Baas

## 2019-07-29 NOTE — Assessment & Plan Note (Signed)
Referral to podiatry per patient request

## 2019-07-29 NOTE — Assessment & Plan Note (Signed)
New problem As above, could be related to her significant vulvar yeast infection causing external bleeding Was unable to perform internal pelvic exam today due to difficulty with positioning of the patient Referral to GYN for further evaluation and management Discussed with family to keep track of this if it happens again

## 2019-07-29 NOTE — Assessment & Plan Note (Addendum)
Will follow up with GI regarding barium swallow study

## 2019-07-29 NOTE — Assessment & Plan Note (Signed)
Significant vulvar yeast infection noted on exam today No internal pelvic exam completed due to difficulty with positioning of the patient It is possible that the friability of her vulva could have contributed to the postmenopausal bleeding As below, I did advise her to follow-up with GYN We will treat with fluconazole, with caution due to cirrhosis, and also clotrimazole cream

## 2019-07-29 NOTE — Assessment & Plan Note (Signed)
Followed by [facility] Hepatology Stable abdominal distention with ascites on exam today Continue albumin infusion with IV Lasix every 2 weeks per GI She is amenable to have a follow-up paracentesis done during her hospitalization stay the inability to find a pocket of fluid that was able to be tapped Continue diuretics Reviewed recent labs Hepatic encephalopathy is much improved, continue Xifaxan and lactulose at current doses

## 2019-07-30 ENCOUNTER — Ambulatory Visit: Admit: 2019-07-30 | Discharge: 2019-07-31 | Payer: MEDICARE

## 2019-07-30 DIAGNOSIS — R188 Other ascites: Secondary | ICD-10-CM | POA: Diagnosis not present

## 2019-07-30 DIAGNOSIS — N1831 Chronic kidney disease, stage 3a: Secondary | ICD-10-CM | POA: Diagnosis not present

## 2019-07-30 DIAGNOSIS — I2510 Atherosclerotic heart disease of native coronary artery without angina pectoris: Secondary | ICD-10-CM | POA: Diagnosis not present

## 2019-07-30 DIAGNOSIS — I129 Hypertensive chronic kidney disease with stage 1 through stage 4 chronic kidney disease, or unspecified chronic kidney disease: Secondary | ICD-10-CM | POA: Diagnosis not present

## 2019-07-30 DIAGNOSIS — M5030 Other cervical disc degeneration, unspecified cervical region: Secondary | ICD-10-CM | POA: Diagnosis not present

## 2019-07-30 DIAGNOSIS — K7290 Hepatic failure, unspecified without coma: Secondary | ICD-10-CM | POA: Diagnosis not present

## 2019-07-30 DIAGNOSIS — I252 Old myocardial infarction: Secondary | ICD-10-CM | POA: Diagnosis not present

## 2019-07-30 DIAGNOSIS — N179 Acute kidney failure, unspecified: Secondary | ICD-10-CM | POA: Diagnosis not present

## 2019-07-30 DIAGNOSIS — K858 Other acute pancreatitis without necrosis or infection: Secondary | ICD-10-CM | POA: Diagnosis not present

## 2019-07-30 DIAGNOSIS — K7581 Nonalcoholic steatohepatitis (NASH): Secondary | ICD-10-CM | POA: Diagnosis not present

## 2019-07-30 DIAGNOSIS — K7460 Unspecified cirrhosis of liver: Secondary | ICD-10-CM | POA: Diagnosis not present

## 2019-07-30 DIAGNOSIS — K5900 Constipation, unspecified: Secondary | ICD-10-CM | POA: Diagnosis not present

## 2019-07-30 DIAGNOSIS — K766 Portal hypertension: Secondary | ICD-10-CM | POA: Diagnosis not present

## 2019-07-30 DIAGNOSIS — I6529 Occlusion and stenosis of unspecified carotid artery: Secondary | ICD-10-CM | POA: Diagnosis not present

## 2019-07-30 DIAGNOSIS — I071 Rheumatic tricuspid insufficiency: Secondary | ICD-10-CM | POA: Diagnosis not present

## 2019-07-30 NOTE — Telephone Encounter (Signed)
Called and left voicemail for patient to call back to be scheduled. 

## 2019-07-31 DIAGNOSIS — K766 Portal hypertension: Secondary | ICD-10-CM | POA: Diagnosis not present

## 2019-07-31 DIAGNOSIS — I2510 Atherosclerotic heart disease of native coronary artery without angina pectoris: Secondary | ICD-10-CM | POA: Diagnosis not present

## 2019-07-31 DIAGNOSIS — K7460 Unspecified cirrhosis of liver: Secondary | ICD-10-CM | POA: Diagnosis not present

## 2019-07-31 DIAGNOSIS — K858 Other acute pancreatitis without necrosis or infection: Secondary | ICD-10-CM | POA: Diagnosis not present

## 2019-07-31 DIAGNOSIS — N179 Acute kidney failure, unspecified: Secondary | ICD-10-CM | POA: Diagnosis not present

## 2019-07-31 DIAGNOSIS — I129 Hypertensive chronic kidney disease with stage 1 through stage 4 chronic kidney disease, or unspecified chronic kidney disease: Secondary | ICD-10-CM | POA: Diagnosis not present

## 2019-07-31 DIAGNOSIS — M5030 Other cervical disc degeneration, unspecified cervical region: Secondary | ICD-10-CM | POA: Diagnosis not present

## 2019-07-31 DIAGNOSIS — K7581 Nonalcoholic steatohepatitis (NASH): Secondary | ICD-10-CM | POA: Diagnosis not present

## 2019-07-31 DIAGNOSIS — N1831 Chronic kidney disease, stage 3a: Secondary | ICD-10-CM | POA: Diagnosis not present

## 2019-07-31 DIAGNOSIS — I6529 Occlusion and stenosis of unspecified carotid artery: Secondary | ICD-10-CM | POA: Diagnosis not present

## 2019-07-31 DIAGNOSIS — I071 Rheumatic tricuspid insufficiency: Secondary | ICD-10-CM | POA: Diagnosis not present

## 2019-07-31 DIAGNOSIS — K5900 Constipation, unspecified: Secondary | ICD-10-CM | POA: Diagnosis not present

## 2019-07-31 DIAGNOSIS — I252 Old myocardial infarction: Secondary | ICD-10-CM | POA: Diagnosis not present

## 2019-07-31 NOTE — Telephone Encounter (Signed)
Shannon Sawyer with [place] [facility] called in stating the patient's husband is wondering what to do now for the barium swallow test, as they have not heard from anyone. Please advise.

## 2019-08-04 DIAGNOSIS — K766 Portal hypertension: Secondary | ICD-10-CM | POA: Diagnosis not present

## 2019-08-04 DIAGNOSIS — N179 Acute kidney failure, unspecified: Secondary | ICD-10-CM | POA: Diagnosis not present

## 2019-08-04 DIAGNOSIS — K5900 Constipation, unspecified: Secondary | ICD-10-CM | POA: Diagnosis not present

## 2019-08-04 DIAGNOSIS — N1831 Chronic kidney disease, stage 3a: Secondary | ICD-10-CM | POA: Diagnosis not present

## 2019-08-04 DIAGNOSIS — K858 Other acute pancreatitis without necrosis or infection: Secondary | ICD-10-CM | POA: Diagnosis not present

## 2019-08-04 DIAGNOSIS — I071 Rheumatic tricuspid insufficiency: Secondary | ICD-10-CM | POA: Diagnosis not present

## 2019-08-04 DIAGNOSIS — I252 Old myocardial infarction: Secondary | ICD-10-CM | POA: Diagnosis not present

## 2019-08-04 DIAGNOSIS — I129 Hypertensive chronic kidney disease with stage 1 through stage 4 chronic kidney disease, or unspecified chronic kidney disease: Secondary | ICD-10-CM | POA: Diagnosis not present

## 2019-08-04 DIAGNOSIS — I2510 Atherosclerotic heart disease of native coronary artery without angina pectoris: Secondary | ICD-10-CM | POA: Diagnosis not present

## 2019-08-04 DIAGNOSIS — I6529 Occlusion and stenosis of unspecified carotid artery: Secondary | ICD-10-CM | POA: Diagnosis not present

## 2019-08-04 DIAGNOSIS — K7460 Unspecified cirrhosis of liver: Secondary | ICD-10-CM | POA: Diagnosis not present

## 2019-08-04 DIAGNOSIS — M5030 Other cervical disc degeneration, unspecified cervical region: Secondary | ICD-10-CM | POA: Diagnosis not present

## 2019-08-04 DIAGNOSIS — K7581 Nonalcoholic steatohepatitis (NASH): Secondary | ICD-10-CM | POA: Diagnosis not present

## 2019-08-04 NOTE — Telephone Encounter (Signed)
Order placed. Per chart review, appears to have been ordered by GI at [facility], but new order placed today

## 2019-08-04 NOTE — Addendum Note (Signed)
Addended by: Virginia Crews on: 08/04/2019 08:12 AM   Modules accepted: Orders

## 2019-08-05 DIAGNOSIS — K7460 Unspecified cirrhosis of liver: Secondary | ICD-10-CM | POA: Diagnosis not present

## 2019-08-05 DIAGNOSIS — K7581 Nonalcoholic steatohepatitis (NASH): Secondary | ICD-10-CM | POA: Diagnosis not present

## 2019-08-05 DIAGNOSIS — N1831 Chronic kidney disease, stage 3a: Secondary | ICD-10-CM | POA: Diagnosis not present

## 2019-08-05 DIAGNOSIS — M5030 Other cervical disc degeneration, unspecified cervical region: Secondary | ICD-10-CM | POA: Diagnosis not present

## 2019-08-05 DIAGNOSIS — I2510 Atherosclerotic heart disease of native coronary artery without angina pectoris: Secondary | ICD-10-CM | POA: Diagnosis not present

## 2019-08-05 DIAGNOSIS — K858 Other acute pancreatitis without necrosis or infection: Secondary | ICD-10-CM | POA: Diagnosis not present

## 2019-08-05 DIAGNOSIS — K766 Portal hypertension: Secondary | ICD-10-CM | POA: Diagnosis not present

## 2019-08-05 DIAGNOSIS — I6529 Occlusion and stenosis of unspecified carotid artery: Secondary | ICD-10-CM | POA: Diagnosis not present

## 2019-08-05 DIAGNOSIS — I252 Old myocardial infarction: Secondary | ICD-10-CM | POA: Diagnosis not present

## 2019-08-05 DIAGNOSIS — I129 Hypertensive chronic kidney disease with stage 1 through stage 4 chronic kidney disease, or unspecified chronic kidney disease: Secondary | ICD-10-CM | POA: Diagnosis not present

## 2019-08-05 DIAGNOSIS — K5900 Constipation, unspecified: Secondary | ICD-10-CM | POA: Diagnosis not present

## 2019-08-05 DIAGNOSIS — I071 Rheumatic tricuspid insufficiency: Secondary | ICD-10-CM | POA: Diagnosis not present

## 2019-08-05 DIAGNOSIS — N179 Acute kidney failure, unspecified: Secondary | ICD-10-CM | POA: Diagnosis not present

## 2019-08-05 NOTE — Telephone Encounter (Signed)
Called and spoke with patient to schedule referral. Patient asked for me to contact patient at another time to schedule. Patient refused to schedule at this time. Advised patient I would be calling at a later time.

## 2019-08-06 ENCOUNTER — Telehealth: Payer: Self-pay | Admitting: Family Medicine

## 2019-08-06 DIAGNOSIS — I6529 Occlusion and stenosis of unspecified carotid artery: Secondary | ICD-10-CM | POA: Diagnosis not present

## 2019-08-06 DIAGNOSIS — K858 Other acute pancreatitis without necrosis or infection: Secondary | ICD-10-CM | POA: Diagnosis not present

## 2019-08-06 DIAGNOSIS — I071 Rheumatic tricuspid insufficiency: Secondary | ICD-10-CM | POA: Diagnosis not present

## 2019-08-06 DIAGNOSIS — N1831 Chronic kidney disease, stage 3a: Secondary | ICD-10-CM | POA: Diagnosis not present

## 2019-08-06 DIAGNOSIS — K7581 Nonalcoholic steatohepatitis (NASH): Secondary | ICD-10-CM | POA: Diagnosis not present

## 2019-08-06 DIAGNOSIS — I2510 Atherosclerotic heart disease of native coronary artery without angina pectoris: Secondary | ICD-10-CM | POA: Diagnosis not present

## 2019-08-06 DIAGNOSIS — M5030 Other cervical disc degeneration, unspecified cervical region: Secondary | ICD-10-CM | POA: Diagnosis not present

## 2019-08-06 DIAGNOSIS — I129 Hypertensive chronic kidney disease with stage 1 through stage 4 chronic kidney disease, or unspecified chronic kidney disease: Secondary | ICD-10-CM | POA: Diagnosis not present

## 2019-08-06 DIAGNOSIS — K7460 Unspecified cirrhosis of liver: Secondary | ICD-10-CM | POA: Diagnosis not present

## 2019-08-06 DIAGNOSIS — K5900 Constipation, unspecified: Secondary | ICD-10-CM | POA: Diagnosis not present

## 2019-08-06 DIAGNOSIS — N179 Acute kidney failure, unspecified: Secondary | ICD-10-CM | POA: Diagnosis not present

## 2019-08-06 DIAGNOSIS — K766 Portal hypertension: Secondary | ICD-10-CM | POA: Diagnosis not present

## 2019-08-06 DIAGNOSIS — I252 Old myocardial infarction: Secondary | ICD-10-CM | POA: Diagnosis not present

## 2019-08-06 NOTE — Telephone Encounter (Signed)
Called and spoke with patient to schedule appointment. While on the phone with patient we got disconnected attempt to reach back out phone on file not accepting calls.

## 2019-08-06 NOTE — Telephone Encounter (Signed)
Copied from [place] [id number]. Topic: General - Other >> Aug 06, 2019 12:36 PM Keene Breath wrote: Reason for CRM: Called to request verbal order for speech therapy recertify for patient - 2xwk 4 beginning 6/8.  For questions, please call [phone]

## 2019-08-07 ENCOUNTER — Telehealth: Payer: Self-pay | Admitting: Family Medicine

## 2019-08-07 DIAGNOSIS — I129 Hypertensive chronic kidney disease with stage 1 through stage 4 chronic kidney disease, or unspecified chronic kidney disease: Secondary | ICD-10-CM | POA: Diagnosis not present

## 2019-08-07 DIAGNOSIS — N1831 Chronic kidney disease, stage 3a: Secondary | ICD-10-CM | POA: Diagnosis not present

## 2019-08-07 DIAGNOSIS — K5900 Constipation, unspecified: Secondary | ICD-10-CM | POA: Diagnosis not present

## 2019-08-07 DIAGNOSIS — M5030 Other cervical disc degeneration, unspecified cervical region: Secondary | ICD-10-CM | POA: Diagnosis not present

## 2019-08-07 DIAGNOSIS — K766 Portal hypertension: Secondary | ICD-10-CM | POA: Diagnosis not present

## 2019-08-07 DIAGNOSIS — K7460 Unspecified cirrhosis of liver: Secondary | ICD-10-CM | POA: Diagnosis not present

## 2019-08-07 DIAGNOSIS — I6529 Occlusion and stenosis of unspecified carotid artery: Secondary | ICD-10-CM | POA: Diagnosis not present

## 2019-08-07 DIAGNOSIS — K858 Other acute pancreatitis without necrosis or infection: Secondary | ICD-10-CM | POA: Diagnosis not present

## 2019-08-07 DIAGNOSIS — I252 Old myocardial infarction: Secondary | ICD-10-CM | POA: Diagnosis not present

## 2019-08-07 DIAGNOSIS — K7581 Nonalcoholic steatohepatitis (NASH): Secondary | ICD-10-CM | POA: Diagnosis not present

## 2019-08-07 DIAGNOSIS — I2510 Atherosclerotic heart disease of native coronary artery without angina pectoris: Secondary | ICD-10-CM | POA: Diagnosis not present

## 2019-08-07 DIAGNOSIS — N179 Acute kidney failure, unspecified: Secondary | ICD-10-CM | POA: Diagnosis not present

## 2019-08-07 DIAGNOSIS — I071 Rheumatic tricuspid insufficiency: Secondary | ICD-10-CM | POA: Diagnosis not present

## 2019-08-07 NOTE — Telephone Encounter (Signed)
Stacy advised.   Thanks,   -Mickel Baas

## 2019-08-07 NOTE — Telephone Encounter (Signed)
Home Health Verbal Orders - Caller/Agency: Jennifer/ [facility] Callback Number: [phone] Requesting OT/PT/Skilled Nursing/Social Work/Speech Therapy: speech therapy Frequency: CHANGE. 1 week 1. 2 week 3.

## 2019-08-07 NOTE — Telephone Encounter (Signed)
OK for verbals

## 2019-08-07 NOTE — Telephone Encounter (Signed)
Anderson Malta given verbal okay.

## 2019-08-07 NOTE — Telephone Encounter (Signed)
Lenell Antu PT with [facility] is calling in to request VO for continued home health services.    Would like permission to recertification for an additional 4 weeks, also like to have home health aid come out to help with bathing.   CB: [phone] okay to lvm

## 2019-08-10 ENCOUNTER — Other Ambulatory Visit: Payer: Self-pay

## 2019-08-10 ENCOUNTER — Ambulatory Visit
Admission: RE | Admit: 2019-08-10 | Discharge: 2019-08-10 | Disposition: A | Discharge status: Home / Self Care | Payer: Medicare Other | Source: Ambulatory Visit | Attending: Family Medicine | Admitting: Family Medicine

## 2019-08-10 ENCOUNTER — Telehealth: Payer: Self-pay

## 2019-08-10 DIAGNOSIS — K224 Dyskinesia of esophagus: Secondary | ICD-10-CM | POA: Diagnosis not present

## 2019-08-10 DIAGNOSIS — R1310 Dysphagia, unspecified: Secondary | ICD-10-CM | POA: Diagnosis not present

## 2019-08-10 DIAGNOSIS — K219 Gastro-esophageal reflux disease without esophagitis: Secondary | ICD-10-CM | POA: Diagnosis not present

## 2019-08-10 NOTE — Telephone Encounter (Signed)
-----   Message from Virginia Crews, MD sent at 08/10/2019 10:50 AM EDT ----- Please fax results to Dr Drue Novel (GI at [facility])

## 2019-08-10 NOTE — Telephone Encounter (Signed)
-----   Message from Virginia Crews, MD sent at 08/10/2019 10:50 AM EDT ----- Please fax results to Dr Drue Novel (GI at [facility])

## 2019-08-10 NOTE — Telephone Encounter (Signed)
Faxed to Dr. Drue Novel fax# [phone].

## 2019-08-11 ENCOUNTER — Encounter: Payer: Self-pay | Admitting: Physician Assistant

## 2019-08-11 ENCOUNTER — Ambulatory Visit: Payer: Self-pay | Admitting: *Deleted

## 2019-08-11 ENCOUNTER — Ambulatory Visit
Admission: RE | Admit: 2019-08-11 | Discharge: 2019-08-11 | Disposition: A | Discharge status: Home / Self Care | Payer: Medicare Other | Source: Ambulatory Visit | Attending: Physician Assistant | Admitting: Physician Assistant

## 2019-08-11 ENCOUNTER — Other Ambulatory Visit: Payer: Self-pay

## 2019-08-11 ENCOUNTER — Ambulatory Visit
Admission: RE | Admit: 2019-08-11 | Discharge: 2019-08-11 | Disposition: A | Discharge status: Home / Self Care | Payer: Medicare Other | Attending: Physician Assistant | Admitting: Physician Assistant

## 2019-08-11 ENCOUNTER — Ambulatory Visit (INDEPENDENT_AMBULATORY_CARE_PROVIDER_SITE_OTHER): Payer: Medicare Other | Admitting: Physician Assistant

## 2019-08-11 ENCOUNTER — Encounter: Payer: Medicare Other | Admitting: Family Medicine

## 2019-08-11 ENCOUNTER — Ambulatory Visit: Payer: Medicare Other

## 2019-08-11 VITALS — BP 134/82 | HR 72 | Temp 96.9°F

## 2019-08-11 DIAGNOSIS — M19012 Primary osteoarthritis, left shoulder: Secondary | ICD-10-CM | POA: Diagnosis not present

## 2019-08-11 DIAGNOSIS — R531 Weakness: Secondary | ICD-10-CM

## 2019-08-11 DIAGNOSIS — M25511 Pain in right shoulder: Secondary | ICD-10-CM

## 2019-08-11 DIAGNOSIS — E871 Hypo-osmolality and hyponatremia: Secondary | ICD-10-CM

## 2019-08-11 DIAGNOSIS — M5030 Other cervical disc degeneration, unspecified cervical region: Secondary | ICD-10-CM

## 2019-08-11 DIAGNOSIS — K7460 Unspecified cirrhosis of liver: Secondary | ICD-10-CM | POA: Diagnosis not present

## 2019-08-11 DIAGNOSIS — M25512 Pain in left shoulder: Secondary | ICD-10-CM | POA: Diagnosis not present

## 2019-08-11 NOTE — Progress Notes (Signed)
Established patient visit   Patient: Shannon Sawyer   DOB: 01-07-1946   74 y.o. Female  MRN: [id number] Visit Date: 08/11/2019  Today's healthcare provider: Trey Sailors, PA-C   Chief Complaint  Patient presents with  . Shoulder Pain  I,Brehanna Deveny M Patton Rabinovich,acting as a scribe for Trey Sailors, PA-C.,have documented all relevant documentation on the behalf of Trey Sailors, PA-C,as directed by  Trey Sailors, PA-C while in the presence of Trey Sailors, PA-C.  Subjective    Shoulder Pain  The pain is present in the right shoulder, left shoulder, left arm and right arm. This is a new problem. The current episode started in the past 7 days. There has been no history of extremity trauma. The problem occurs constantly. The problem has been unchanged. The quality of the pain is described as sharp. The pain is at a severity of 6/10. The pain is moderate. Associated symptoms include an inability to bear weight, a limited range of motion and stiffness. Pertinent negatives include no numbness or tingling. The symptoms are aggravated by activity, lying down and standing. She has tried heat, acetaminophen and NSAIDS for the symptoms. The treatment provided no relief.   Patient with a history of cirrhosis, CKD 3, HTN, cervical DDD, and chronic hyponatremia presents today for the above symptoms. Patient presents with her husband today for weakness in her shoulders since Saturday. Patient's husband reports she previously had a crick in her neck which then resolved and she now has weakness in her shoulders. Husband reports patient is normally able to stand up from her wheelchair without assistance but is not able to. Patient reports bilateral shoulder pain without history of injury. She denies confusion, trouble seeing, one sided weakness. She had a swallow study yesterday however she has had chronic swallowing issues the precede these current symptoms. Patient denies numbness or tingling in her  extremities.     Medications: Outpatient Medications Prior to Visit  Medication Sig  . acetaminophen (TYLENOL) 500 MG tablet Take 500 mg by mouth every 6 (six) hours as needed.   Marland Kitchen aMILoride (MIDAMOR) 5 MG tablet Take by mouth.  Marland Kitchen aspirin EC 81 MG tablet Take 81 mg by mouth daily.   . clotrimazole (CLOTRIMAZOLE 3) 2 % vaginal cream Place 1 Applicatorful vaginally at bedtime.  . furosemide (LASIX) 20 MG tablet Take 20 mg by mouth daily.  Marland Kitchen lactulose (CHRONULAC) 10 GM/15ML solution Take 20 g by mouth daily.   Marland Kitchen levofloxacin (LEVAQUIN) 250 MG tablet Take by mouth.  . mirtazapine (REMERON) 15 MG tablet Take 1 tablet (15 mg total) by mouth at bedtime.  . nitroGLYCERIN (NITROSTAT) 0.4 MG SL tablet Place 0.4 mg under the tongue every 5 (five) minutes as needed for chest pain.   Marland Kitchen omeprazole (PRILOSEC) 20 MG capsule Take 20 mg by mouth daily.   . ondansetron (ZOFRAN) 4 MG tablet Take 4 mg by mouth every 8 (eight) hours as needed.  . potassium chloride 20 MEQ/15ML (10%) SOLN Take by mouth.  . propranolol (INDERAL) 10 MG tablet Take by mouth.  . rifaximin (XIFAXAN) 200 MG tablet Take 400 mg by mouth daily.  Marland Kitchen lovastatin (MEVACOR) 20 MG tablet TAKE 1 TABLET BY MOUTH EVERY DAY WITH DINNER  . spironolactone (ALDACTONE) 25 MG tablet Take 1 tablet (25 mg total) by mouth daily. (Patient not taking: Reported on 07/28/2019)  . Zinc Sulfate (ZINC 15) 66 MG TABS Take 1 tablet (66 mg total) by mouth daily.  Unsure of dose (Patient not taking: Reported on 07/28/2019)   No facility-administered medications prior to visit.    Review of Systems  Constitutional: Negative.   Respiratory: Negative.   Cardiovascular: Negative.   Gastrointestinal: Negative.   Musculoskeletal: Positive for stiffness.  Neurological: Negative for tingling and numbness.      Objective    BP 134/82 (BP Location: Left Arm, Patient Position: Sitting, Cuff Size: Normal)   Pulse 72   Temp (!) 96.9 F (36.1 C) (Temporal)   SpO2 95%      Physical Exam Constitutional:      Appearance: Normal appearance.  Eyes:     General: Lids are normal.     Extraocular Movements: Extraocular movements intact.     Conjunctiva/sclera: Conjunctivae normal.     Pupils: Pupils are equal, round, and reactive to light.  Skin:    General: Skin is warm and dry.  Neurological:     Mental Status: She is alert.     GCS: GCS eye subscore is 4. GCS verbal subscore is 5. GCS motor subscore is 6.     Cranial Nerves: No cranial nerve deficit.     Motor: Weakness present.     Deep Tendon Reflexes: Reflexes normal.     Reflex Scores:      Bicep reflexes are 2+ on the right side and 2+ on the left side.      Patellar reflexes are 2+ on the right side and 2+ on the left side.    Comments: 4+ Bilateral upper and lower extremity strength.   Psychiatric:        Mood and Affect: Mood normal.        Behavior: Behavior normal.       No results found for any visits on 08/11/19.  Assessment & Plan    1. Hyponatremia   2. Cirrhosis of liver not due to alcohol (HCC)   3. DDD (degenerative disc disease), cervical   4. Weakness  Patient reporting new onset weakness. Have spoken to her PCP about patient's baseline which does seem to include difficulty getting up from seated position as she has required help in the bathroom and significant help transferring to exam table. DDx: polymalgia rheumatica, arthritis, rhabdomyolysis. Less concerned for myasthenia gravis. No unilateral deficits today.  Will check labs as below. Offered trial of prednisone (swelling listed as allergy but it is nonspecific) though patient does not want to do this. Counseled on strict return precautions.   - CBC with Differential - Comprehensive Metabolic Panel (CMET) - CK (Creatine Kinase) - Sed Rate (ESR) - C-reactive protein  5. Acute pain of both shoulders  - DG Shoulder Left; Future - DG Shoulder Right; Future  Return if symptoms worsen or fail to improve.       I have spent 45 minutes with this patient, >50% of which was spent on counseling and coordination of care.    ITrey Sailors, PA-C, have reviewed all documentation for this visit. The documentation on 08/11/19 for the exam, diagnosis, procedures, and orders are all accurate and complete.    Maryella Shivers  [facility] [phone] (phone) [phone] (fax)  [facility]

## 2019-08-11 NOTE — Telephone Encounter (Signed)
Pain started over the week end- pain in neck/shoulders- heat/Tylenol. Patient has history of neck surgery.Patient continued with shoulder( bilateral) pain and trouble reaching out. Patient had swallow test yesterday- she did well- but could have tired her. Patient was doing well walking around over the week end- even with the shoulder pain. Last night patient was unable to get balance and had to sleep in chair. Patient was able to make it through the night- and this morning she was able to get balance back and walk with some assistance.  Reason for Disposition  Weakness (i.e., loss of strength) in hand or fingers    (Exception: not truly weak; hand feels weak because of pain)  Answer Assessment - Initial Assessment Questions 1. ONSET: "When did the pain start?"     Friday night/Saturday morning 2. LOCATION: "Where is the pain located?"     Bilateral shoulder pain 3. PAIN: "How bad is the pain?" (Scale 1-10; or mild, moderate, severe)   - MILD (1-3): doesn't interfere with normal activities   - MODERATE (4-7): interferes with normal activities (e.g., work or school) or awakens from sleep   - SEVERE (8-10): excruciating pain, unable to do any normal activities, unable to move arm at all due to pain     Moderate/severe 4. WORK OR EXERCISE: "Has there been any recent work or exercise that involved this part of the body?"     no 5. CAUSE: "What do you think is causing the shoulder pain?"     Though nerve related- possible dehydration 6. OTHER SYMPTOMS: "Do you have any other symptoms?" (e.g., neck pain, swelling, rash, fever, numbness, weakness)     Weakness last night 7. PREGNANCY: "Is there any chance you are pregnant?" "When was your last menstrual period?"     n/a  Protocols used: SHOULDER PAIN-A-AH

## 2019-08-11 NOTE — Patient Instructions (Signed)
Weakness Weakness is a lack of strength. You may feel weak all over your body (generalized), or you may feel weak in one part of your body (focal). There are many potential causes of weakness. Sometimes, the cause of your weakness may not be known. Some causes of weakness can be serious, so it is important to see your doctor. Follow these instructions at home: Activity  Rest as needed.  Try to get enough sleep. Most adults need 7-8 hours of sleep each night. Talk to your doctor about how much sleep you need each night.  Do exercises, such as arm curls and leg raises, for 30 minutes at least 2 days a week or as told by your doctor.  Think about working with a physical therapist or trainer to help you get stronger. General instructions   Take over-the-counter and prescription medicines only as told by your doctor.  Eat a healthy, well-balanced diet. This includes: ? Proteins to build muscles, such as lean meats and fish. ? Fresh fruits and vegetables. ? Carbohydrates to boost energy, such as whole grains.  Drink enough fluid to keep your pee (urine) pale yellow.  Keep all follow-up visits as told by your doctor. This is important. Contact a doctor if:  Your weakness does not get better or it gets worse.  Your weakness affects your ability to: ? Think clearly. ? Do your normal daily activities. Get help right away if you:  Have sudden weakness on one side of your face or body.  Have chest pain.  Have trouble breathing or shortness of breath.  Have problems with your vision.  Have trouble talking or swallowing.  Have trouble standing or walking.  Are light-headed.  Pass out (lose consciousness). Summary  Weakness is a lack of strength. You may feel weak all over your body or just in one part of your body.  There are many potential causes of weakness. Sometimes, the cause of your weakness may not be known.  Rest as needed, and try to get enough sleep. Most adults  need 7-8 hours of sleep each night.  Eat a healthy, well-balanced diet. This information is not intended to replace advice given to you by your health care provider. Make sure you discuss any questions you have with your health care provider. Document Revised: 09/25/2017 Document Reviewed: 09/25/2017 Elsevier Patient Education  [place].

## 2019-08-12 ENCOUNTER — Telehealth: Payer: Self-pay | Admitting: Physician Assistant

## 2019-08-12 ENCOUNTER — Telehealth: Payer: Self-pay

## 2019-08-12 ENCOUNTER — Ambulatory Visit: Payer: Medicare Other | Admitting: Family Medicine

## 2019-08-12 ENCOUNTER — Ambulatory Visit: Admit: 2019-08-12 | Discharge: 2019-08-13 | Disposition: A | Discharge status: Home / Self Care | Payer: MEDICARE

## 2019-08-12 DIAGNOSIS — Z87891 Personal history of nicotine dependence: Secondary | ICD-10-CM | POA: Diagnosis not present

## 2019-08-12 DIAGNOSIS — D72829 Elevated white blood cell count, unspecified: Secondary | ICD-10-CM | POA: Diagnosis not present

## 2019-08-12 DIAGNOSIS — I2510 Atherosclerotic heart disease of native coronary artery without angina pectoris: Secondary | ICD-10-CM | POA: Diagnosis not present

## 2019-08-12 DIAGNOSIS — K7460 Unspecified cirrhosis of liver: Secondary | ICD-10-CM | POA: Diagnosis not present

## 2019-08-12 DIAGNOSIS — N1832 Chronic kidney disease, stage 3b: Secondary | ICD-10-CM | POA: Diagnosis not present

## 2019-08-12 DIAGNOSIS — Z955 Presence of coronary angioplasty implant and graft: Secondary | ICD-10-CM | POA: Diagnosis not present

## 2019-08-12 DIAGNOSIS — Z885 Allergy status to narcotic agent status: Secondary | ICD-10-CM | POA: Diagnosis not present

## 2019-08-12 DIAGNOSIS — I129 Hypertensive chronic kidney disease with stage 1 through stage 4 chronic kidney disease, or unspecified chronic kidney disease: Secondary | ICD-10-CM | POA: Diagnosis not present

## 2019-08-12 DIAGNOSIS — Z91041 Radiographic dye allergy status: Secondary | ICD-10-CM | POA: Diagnosis not present

## 2019-08-12 DIAGNOSIS — R140 Abdominal distension (gaseous): Secondary | ICD-10-CM | POA: Diagnosis not present

## 2019-08-12 DIAGNOSIS — Z79899 Other long term (current) drug therapy: Secondary | ICD-10-CM | POA: Diagnosis not present

## 2019-08-12 DIAGNOSIS — E785 Hyperlipidemia, unspecified: Secondary | ICD-10-CM | POA: Diagnosis not present

## 2019-08-12 DIAGNOSIS — Z888 Allergy status to other drugs, medicaments and biological substances status: Secondary | ICD-10-CM | POA: Diagnosis not present

## 2019-08-12 DIAGNOSIS — R799 Abnormal finding of blood chemistry, unspecified: Secondary | ICD-10-CM | POA: Diagnosis not present

## 2019-08-12 DIAGNOSIS — Z7982 Long term (current) use of aspirin: Secondary | ICD-10-CM | POA: Diagnosis not present

## 2019-08-12 DIAGNOSIS — R899 Unspecified abnormal finding in specimens from other organs, systems and tissues: Principal | ICD-10-CM

## 2019-08-12 LAB — COMPREHENSIVE METABOLIC PANEL
ALT: 28 IU/L (ref 0–32)
AST: 77 IU/L — ABNORMAL HIGH (ref 0–40)
Albumin/Globulin Ratio: 1 — ABNORMAL LOW (ref 1.2–2.2)
Albumin: 3.3 g/dL — ABNORMAL LOW (ref 3.7–4.7)
Alkaline Phosphatase: 107 IU/L (ref 48–121)
BUN/Creatinine Ratio: 19 (ref 12–28)
BUN: 22 mg/dL (ref 8–27)
Bilirubin Total: 3.2 mg/dL — ABNORMAL HIGH (ref 0.0–1.2)
CO2: 17 mmol/L — ABNORMAL LOW (ref 20–29)
Calcium: 8.6 mg/dL — ABNORMAL LOW (ref 8.7–10.3)
Chloride: 94 mmol/L — ABNORMAL LOW (ref 96–106)
Creatinine, Ser: 1.15 mg/dL — ABNORMAL HIGH (ref 0.57–1.00)
GFR calc Af Amer: 54 mL/min/{1.73_m2} — ABNORMAL LOW (ref >59)
GFR calc non Af Amer: 47 mL/min/{1.73_m2} — ABNORMAL LOW (ref >59)
Globulin, Total: 3.3 g/dL (ref 1.5–4.5)
Glucose: 130 mg/dL — ABNORMAL HIGH (ref 65–99)
Potassium: 3.6 mmol/L (ref 3.5–5.2)
Sodium: 127 mmol/L — ABNORMAL LOW (ref 134–144)
Total Protein: 6.6 g/dL (ref 6.0–8.5)

## 2019-08-12 LAB — CBC WITH DIFFERENTIAL/PLATELET
Basophils Absolute: 0.2 10*3/uL (ref 0.0–0.2)
Basos: 1 %
EOS (ABSOLUTE): 0.2 10*3/uL (ref 0.0–0.4)
Eos: 1 %
Hematocrit: 27.7 % — ABNORMAL LOW (ref 34.0–46.6)
Hemoglobin: 9.5 g/dL — ABNORMAL LOW (ref 11.1–15.9)
Lymphocytes Absolute: 0.9 10*3/uL (ref 0.7–3.1)
Lymphs: 6 %
MCH: 33 pg (ref 26.6–33.0)
MCHC: 34.3 g/dL (ref 31.5–35.7)
MCV: 96 fL (ref 79–97)
Monocytes Absolute: 1.2 10*3/uL — ABNORMAL HIGH (ref 0.1–0.9)
Monocytes: 8 %
Neutrophils Absolute: 12.7 10*3/uL — ABNORMAL HIGH (ref 1.4–7.0)
Neutrophils: 84 %
Platelets: 95 10*3/uL — CL (ref 150–450)
RBC: 2.88 x10E6/uL — ABNORMAL LOW (ref 3.77–5.28)
RDW: 14.9 % (ref 11.7–15.4)
WBC: 15.1 10*3/uL — ABNORMAL HIGH (ref 3.4–10.8)

## 2019-08-12 LAB — C-REACTIVE PROTEIN: CRP: 47 mg/L — ABNORMAL HIGH (ref 0–10)

## 2019-08-12 LAB — SEDIMENTATION RATE: Sed Rate: 45 mm/hr — ABNORMAL HIGH (ref 0–40)

## 2019-08-12 LAB — CK: Total CK: 32 U/L (ref 32–182)

## 2019-08-12 NOTE — Telephone Encounter (Signed)
Patients husband is returning phone call. Call back [phone]

## 2019-08-12 NOTE — Telephone Encounter (Signed)
Patient advised as below.  

## 2019-08-12 NOTE — Telephone Encounter (Signed)
Mr. Wadas reports that he spoke to Carles Collet and they are now at [facility] ER.

## 2019-08-12 NOTE — Telephone Encounter (Signed)
Pts husband called stating that he has concerns and questions regarding pts results. He is requesting a call back ASAP. Please advise.

## 2019-08-12 NOTE — Telephone Encounter (Signed)
I called patient's cell phone number and her husband's listed number at 8:35 AM. Got voicemails on both and left message to call back. Her white count is elevated in a way that shows a potential bacterial infection. Her inflammatory markers are elevated. Her muscle breakdown labs are normal. Her kidney function is stable as is her sodium.   At this point, with patient's other medical conditions and her labwork, she could have any number of infections that I do not have the capacity to work up from our office. I would recommend she be seen in the ER and if she is a known patient to [facility] then she may go there if she would like.  Please let patient know this if she or her family calls back.

## 2019-08-12 NOTE — Telephone Encounter (Signed)
Patient and husband advised as below. Patient verbalizes understanding and is in agreement with treatment plan. They report that they will discuss and see which ER they will be going to. Patient did want to remind provider that she is taking antibiotic.

## 2019-08-12 NOTE — Telephone Encounter (Signed)
Spoke to husband personally about my recommendations. Counseled that she needs to be worked up for different infections that I do not have the capability to work up. Counseled this may not be the same infection she was treated for previously. Counseled that if [facility] is familiar with her case and has seen her before, this may be the best to go. Would not advise going to [facility] as I do no think they'll have the specialists required to see her. Patient's husband expresses understanding.

## 2019-08-12 NOTE — Telephone Encounter (Signed)
-----   Message from Trinna Post, [place] sent at 08/12/2019 10:14 AM EDT ----- Mild arthritis in right and left shoulder that I do not think accounts for her pain. Possible anatomic variation or history of dislocation in her left shoulder. See my other message about proceeding to hospital.

## 2019-08-12 NOTE — Telephone Encounter (Signed)
Patient's husband call was returned and he stated he does not understand why the patient has to go back to the hospital if they didn't get rid of the infection before. Then patient requested to speak with Adriana. FYI

## 2019-08-12 NOTE — Telephone Encounter (Signed)
-----   Message from Trinna Post, [place] sent at 08/12/2019  8:37 AM EDT ----- I called patient's cell phone number and her husband's listed number at 8:35 AM. Got voicemails on both and left message to call back. Her white count is elevated in a way that shows a potential bacterial infection. Her inflammatory markers are elevated. Her muscle breakdown labs are normal. Her kidney function is stable as is her sodium.   At this point, with patient's other medical conditions and her labwork, she could have any number of infections that I do not have the capacity to work up from our office. I would recommend she be seen in the ER and if she is a known patient to [facility] then she may go there if she would like.  Please let patient know this if she or her family calls back.

## 2019-08-14 DIAGNOSIS — K652 Spontaneous bacterial peritonitis: Secondary | ICD-10-CM | POA: Diagnosis not present

## 2019-08-14 DIAGNOSIS — K7581 Nonalcoholic steatohepatitis (NASH): Secondary | ICD-10-CM | POA: Diagnosis not present

## 2019-08-14 DIAGNOSIS — K7469 Other cirrhosis of liver: Secondary | ICD-10-CM | POA: Diagnosis not present

## 2019-08-14 DIAGNOSIS — I6529 Occlusion and stenosis of unspecified carotid artery: Secondary | ICD-10-CM | POA: Diagnosis not present

## 2019-08-14 DIAGNOSIS — E876 Hypokalemia: Secondary | ICD-10-CM | POA: Diagnosis not present

## 2019-08-14 DIAGNOSIS — N1832 Chronic kidney disease, stage 3b: Secondary | ICD-10-CM | POA: Diagnosis not present

## 2019-08-14 DIAGNOSIS — E871 Hypo-osmolality and hyponatremia: Secondary | ICD-10-CM | POA: Diagnosis not present

## 2019-08-14 DIAGNOSIS — I2510 Atherosclerotic heart disease of native coronary artery without angina pectoris: Secondary | ICD-10-CM | POA: Diagnosis not present

## 2019-08-14 DIAGNOSIS — K7290 Hepatic failure, unspecified without coma: Secondary | ICD-10-CM | POA: Diagnosis not present

## 2019-08-14 DIAGNOSIS — I071 Rheumatic tricuspid insufficiency: Secondary | ICD-10-CM | POA: Diagnosis not present

## 2019-08-14 DIAGNOSIS — R188 Other ascites: Secondary | ICD-10-CM | POA: Diagnosis not present

## 2019-08-14 DIAGNOSIS — K858 Other acute pancreatitis without necrosis or infection: Secondary | ICD-10-CM | POA: Diagnosis not present

## 2019-08-14 DIAGNOSIS — I129 Hypertensive chronic kidney disease with stage 1 through stage 4 chronic kidney disease, or unspecified chronic kidney disease: Secondary | ICD-10-CM | POA: Diagnosis not present

## 2019-08-16 DIAGNOSIS — K7460 Unspecified cirrhosis of liver: Principal | ICD-10-CM

## 2019-08-16 MED ORDER — SPIRONOLACTONE 25 MG TABLET
ORAL_TABLET | 1 refills | 0 days
Start: 2019-08-16 — End: ?

## 2019-08-17 DIAGNOSIS — K7581 Nonalcoholic steatohepatitis (NASH): Secondary | ICD-10-CM | POA: Diagnosis not present

## 2019-08-17 DIAGNOSIS — I129 Hypertensive chronic kidney disease with stage 1 through stage 4 chronic kidney disease, or unspecified chronic kidney disease: Secondary | ICD-10-CM | POA: Diagnosis not present

## 2019-08-17 DIAGNOSIS — I071 Rheumatic tricuspid insufficiency: Secondary | ICD-10-CM | POA: Diagnosis not present

## 2019-08-17 DIAGNOSIS — I2510 Atherosclerotic heart disease of native coronary artery without angina pectoris: Secondary | ICD-10-CM | POA: Diagnosis not present

## 2019-08-17 DIAGNOSIS — K7469 Other cirrhosis of liver: Secondary | ICD-10-CM | POA: Diagnosis not present

## 2019-08-17 DIAGNOSIS — K652 Spontaneous bacterial peritonitis: Secondary | ICD-10-CM | POA: Diagnosis not present

## 2019-08-17 DIAGNOSIS — R188 Other ascites: Secondary | ICD-10-CM | POA: Diagnosis not present

## 2019-08-17 DIAGNOSIS — E876 Hypokalemia: Secondary | ICD-10-CM | POA: Diagnosis not present

## 2019-08-17 DIAGNOSIS — I6529 Occlusion and stenosis of unspecified carotid artery: Secondary | ICD-10-CM | POA: Diagnosis not present

## 2019-08-17 DIAGNOSIS — N1832 Chronic kidney disease, stage 3b: Secondary | ICD-10-CM | POA: Diagnosis not present

## 2019-08-17 DIAGNOSIS — K7290 Hepatic failure, unspecified without coma: Secondary | ICD-10-CM | POA: Diagnosis not present

## 2019-08-17 DIAGNOSIS — K858 Other acute pancreatitis without necrosis or infection: Secondary | ICD-10-CM | POA: Diagnosis not present

## 2019-08-17 DIAGNOSIS — E871 Hypo-osmolality and hyponatremia: Secondary | ICD-10-CM | POA: Diagnosis not present

## 2019-08-17 MED ORDER — SPIRONOLACTONE 25 MG TABLET
ORAL_TABLET | 1 refills | 0 days
Start: 2019-08-17 — End: ?

## 2019-08-18 ENCOUNTER — Other Ambulatory Visit: Payer: Self-pay | Admitting: Obstetrics and Gynecology

## 2019-08-18 ENCOUNTER — Encounter: Payer: Self-pay | Admitting: Obstetrics and Gynecology

## 2019-08-18 ENCOUNTER — Ambulatory Visit (INDEPENDENT_AMBULATORY_CARE_PROVIDER_SITE_OTHER): Payer: Medicare Other | Admitting: Obstetrics and Gynecology

## 2019-08-18 ENCOUNTER — Other Ambulatory Visit: Payer: Self-pay

## 2019-08-18 VITALS — BP 128/68 | Ht 65.0 in | Wt 156.6 lb

## 2019-08-18 DIAGNOSIS — N9089 Other specified noninflammatory disorders of vulva and perineum: Secondary | ICD-10-CM

## 2019-08-18 DIAGNOSIS — N950 Postmenopausal bleeding: Secondary | ICD-10-CM

## 2019-08-18 DIAGNOSIS — N760 Acute vaginitis: Secondary | ICD-10-CM

## 2019-08-18 NOTE — Progress Notes (Signed)
Patient ID: Shannon Sawyer, female   DOB: 1945/05/07, 74 y.o.   MRN: [id number]  Reason for Consult: Gynecologic Exam   Referred by Virginia Crews, MD  Subjective:     HPI:  Shannon Sawyer is a 74 y.o. female. She presents today with complaints of recent vaginal spotting. She was diagnosed with a yeast infection and treated with fluconazole. She reports she has not seen vaginal bleeding since then. She reports she had a hysterectomy in 1970-04-30 for endometriosis. She denies a history of abnormal pap smears.   OB History Hx of 3 vaginal deliveries. Reports one of those children was a still birth and that one of her children died of cancer in May 01, 2011. She is not able to tell me the days of her children's birthdays or years.   Past Medical History:  Diagnosis Date  . Allergy   . Anemia    LOW PLATELET COUNTS  ([facility])  . Anxiety   . Carotid artery disease (HCC)    BILAT.        Marland Kitchen Chronic cough   . Cirrhosis, nonalcoholic (HCC)    PORTAL HTN, FATTY TISSUE   . Coronary artery disease   . DDD (degenerative disc disease), cervical    SPONDYLOLISTHESIS  . Depression   . GERD (gastroesophageal reflux disease)   . Headache    HX OCCULAR MIGRAINES  . Hypertension    ESSENTIAL  . Myocardial infarction ([place])   . Shortness of breath dyspnea    WITH EXERTION   . Tricuspid insufficiency    MODERATE    Family History  Problem Relation Age of Onset  . Liver cancer Mother   . Lymphoma Father   . COPD Father   . COPD Brother   . Liver cancer Son   . Breast cancer Neg Hx   . Colon cancer Neg Hx    Past Surgical History:  Procedure Laterality Date  . ABDOMINAL HYSTERECTOMY    . ANTERIOR CERVICAL DECOMP/DISCECTOMY FUSION N/A 06/03/2014   Procedure: CERVICAL FIVE-CERVICAL SIX, CERVICAL SIX-CERVICAL SEVEN ANTERIOR CERVICAL DECOMPRESSION/DISCECTOMY ;  Surgeon: Karie Chimera, MD;  Location: [place] NEURO ORS;  Service: Neurosurgery;  Laterality: N/A;  . BREAST BIOPSY Left  09/02/2015   benign  . BREAST CYST ASPIRATION Left    neg  . CARDIAC CATHETERIZATION     2 STENTS 2011/05/01 IN [place]  . cardiac stents    . ESOPHAGOGASTRODUODENOSCOPY (EGD) WITH PROPOFOL N/A 02/21/2015   Procedure: ESOPHAGOGASTRODUODENOSCOPY (EGD) WITH PROPOFOL;  Surgeon: Josefine Class, MD;  Location[facility];  Service: Endoscopy;  Laterality: N/A;  . EYE SURGERY      Short Social History:  Social History   Tobacco Use  . Smoking status: Former Smoker    Types: Cigarettes  . Smokeless tobacco: Never Used  . Tobacco comment: formerly tried smoking cigarettes, no regular use  Substance Use Topics  . Alcohol use: No    Allergies  Allergen Reactions  . Contrast Media  [Iodinated Diagnostic Agents] Rash    Blister rash   . Azelastine Swelling  . Flonase [Fluticasone Propionate] Swelling  . Prednisone Swelling  . Shellfish Allergy Swelling and Other (See Comments)    Per spouse, determined on allergy test.  . Tramadol Nausea Only    Current Outpatient Medications  Medication Sig Dispense Refill  . acetaminophen (TYLENOL) 500 MG tablet Take 500 mg by mouth every 6 (six) hours as needed.     Marland Kitchen aMILoride (MIDAMOR) 5  MG tablet Take by mouth.    Marland Kitchen aspirin EC 81 MG tablet Take 81 mg by mouth daily.     . clotrimazole (CLOTRIMAZOLE 3) 2 % vaginal cream Place 1 Applicatorful vaginally at bedtime. 21 g 0  . furosemide (LASIX) 20 MG tablet Take 20 mg by mouth daily.    Marland Kitchen lactulose (CHRONULAC) 10 GM/15ML solution Take 20 g by mouth daily.     Marland Kitchen levofloxacin (LEVAQUIN) 250 MG tablet Take by mouth.    . lovastatin (MEVACOR) 20 MG tablet TAKE 1 TABLET BY MOUTH EVERY DAY WITH DINNER    . mirtazapine (REMERON) 15 MG tablet Take 1 tablet (15 mg total) by mouth at bedtime. 90 tablet 1  . nitroGLYCERIN (NITROSTAT) 0.4 MG SL tablet Place 0.4 mg under the tongue every 5 (five) minutes as needed for chest pain.     Marland Kitchen omeprazole (PRILOSEC) 20 MG capsule Take 20 mg by mouth daily.       . ondansetron (ZOFRAN) 4 MG tablet Take 4 mg by mouth every 8 (eight) hours as needed.    . potassium chloride 20 MEQ/15ML (10%) SOLN Take by mouth.    . propranolol (INDERAL) 10 MG tablet Take by mouth.    . rifaximin (XIFAXAN) 200 MG tablet Take 400 mg by mouth daily.    Marland Kitchen spironolactone (ALDACTONE) 25 MG tablet Take 1 tablet (25 mg total) by mouth daily.    . Zinc Sulfate (ZINC 15) 66 MG TABS Take 1 tablet (66 mg total) by mouth daily. Unsure of dose  0   No current facility-administered medications for this visit.    Review of Systems  Constitutional: Negative for chills, fatigue, fever and unexpected weight change.  HENT: Negative for trouble swallowing.  Eyes: Negative for loss of vision.  Respiratory: Negative for cough, shortness of breath and wheezing.  Cardiovascular: Negative for chest pain, leg swelling, palpitations and syncope.  GI: Negative for abdominal pain, blood in stool, diarrhea, nausea and vomiting.  GU: Negative for difficulty urinating, dysuria, frequency and hematuria.  Musculoskeletal: Negative for back pain, leg pain and joint pain.  Skin: Negative for rash.  Neurological: Negative for dizziness, headaches, light-headedness, numbness and seizures.  Psychiatric: Negative for behavioral problem, confusion, depressed mood and sleep disturbance.        Objective:  Objective   Vitals:   08/18/19 0956  BP: 128/68  Weight: 156 lb 9.6 oz (71 kg)  Height: 5' 5"  (1.651 m)   Body mass index is 26.06 kg/m.  Physical Exam Vitals and nursing note reviewed.  Constitutional:      Appearance: She is well-developed.  HENT:     Head: Normocephalic and atraumatic.  Eyes:     Pupils: Pupils are equal, round, and reactive to light.  Cardiovascular:     Rate and Rhythm: Normal rate and regular rhythm.  Pulmonary:     Effort: Pulmonary effort is normal. No respiratory distress.  Abdominal:     General: There is distension.     Comments: Distended with cirrhotic  fluid.  Genitourinary:    Comments: Vulvar irritation and erythema, thick white discharge ontop of erythema. Loss of labia minora and clitoral hood. No evidence of vaginal prolapse at this time.  Skin:    General: Skin is warm and dry.  Neurological:     Mental Status: She is alert and oriented to person, place, and time.  Psychiatric:        Behavior: Behavior normal.  Thought Content: Thought content normal.        Judgment: Judgment normal.          Assessment/Plan:     74 yo with vulvar irritation, recent yeast infection seems to no of resolved.  Possible lichen chronicus. Patient denies pain. She has a history of allergy to steroids and will therefore not treat with topical steroid ointment was I would normally.  Would not recommend topical  estrogen given history of cirrhosis.  Discussed options for management of perineal skin. Recommended using shower head attachment to gently wash the skin. Pat dry after showering. Apply Vaseline, A&D ointment, or Vanibiss to the vulva after showering and before bed. Use cornstarch in underwear to keep skin dry 1-2 times a day.  Will send nuswab to confirm resolution of yeast infection.   More than 30 minutes were spent face to face with the patient in the room, reviewing the medical record, labs and images, and coordinating care for the patient. The plan of management was discussed in detail and counseling was provided.    Adrian Prows MD [facility] 08/18/2019 10:32 AM

## 2019-08-19 DIAGNOSIS — N1832 Chronic kidney disease, stage 3b: Secondary | ICD-10-CM | POA: Diagnosis not present

## 2019-08-19 DIAGNOSIS — E782 Mixed hyperlipidemia: Secondary | ICD-10-CM | POA: Diagnosis not present

## 2019-08-19 DIAGNOSIS — K858 Other acute pancreatitis without necrosis or infection: Secondary | ICD-10-CM | POA: Diagnosis not present

## 2019-08-19 DIAGNOSIS — K7581 Nonalcoholic steatohepatitis (NASH): Secondary | ICD-10-CM | POA: Diagnosis not present

## 2019-08-19 DIAGNOSIS — I129 Hypertensive chronic kidney disease with stage 1 through stage 4 chronic kidney disease, or unspecified chronic kidney disease: Secondary | ICD-10-CM | POA: Diagnosis not present

## 2019-08-19 DIAGNOSIS — K652 Spontaneous bacterial peritonitis: Secondary | ICD-10-CM | POA: Diagnosis not present

## 2019-08-19 DIAGNOSIS — E871 Hypo-osmolality and hyponatremia: Secondary | ICD-10-CM | POA: Diagnosis not present

## 2019-08-19 DIAGNOSIS — K7290 Hepatic failure, unspecified without coma: Secondary | ICD-10-CM | POA: Diagnosis not present

## 2019-08-19 DIAGNOSIS — I10 Essential (primary) hypertension: Secondary | ICD-10-CM | POA: Diagnosis not present

## 2019-08-19 DIAGNOSIS — R188 Other ascites: Secondary | ICD-10-CM | POA: Diagnosis not present

## 2019-08-19 DIAGNOSIS — E876 Hypokalemia: Secondary | ICD-10-CM | POA: Diagnosis not present

## 2019-08-19 DIAGNOSIS — I071 Rheumatic tricuspid insufficiency: Secondary | ICD-10-CM | POA: Diagnosis not present

## 2019-08-19 DIAGNOSIS — I6529 Occlusion and stenosis of unspecified carotid artery: Secondary | ICD-10-CM | POA: Diagnosis not present

## 2019-08-19 DIAGNOSIS — I6523 Occlusion and stenosis of bilateral carotid arteries: Secondary | ICD-10-CM | POA: Diagnosis not present

## 2019-08-19 DIAGNOSIS — I2510 Atherosclerotic heart disease of native coronary artery without angina pectoris: Secondary | ICD-10-CM | POA: Diagnosis not present

## 2019-08-19 DIAGNOSIS — K7469 Other cirrhosis of liver: Secondary | ICD-10-CM | POA: Diagnosis not present

## 2019-08-20 ENCOUNTER — Telehealth: Payer: Self-pay | Admitting: Family Medicine

## 2019-08-20 DIAGNOSIS — K7469 Other cirrhosis of liver: Secondary | ICD-10-CM | POA: Diagnosis not present

## 2019-08-20 DIAGNOSIS — K7581 Nonalcoholic steatohepatitis (NASH): Secondary | ICD-10-CM | POA: Diagnosis not present

## 2019-08-20 DIAGNOSIS — E871 Hypo-osmolality and hyponatremia: Secondary | ICD-10-CM | POA: Diagnosis not present

## 2019-08-20 DIAGNOSIS — I6529 Occlusion and stenosis of unspecified carotid artery: Secondary | ICD-10-CM | POA: Diagnosis not present

## 2019-08-20 DIAGNOSIS — R188 Other ascites: Secondary | ICD-10-CM | POA: Diagnosis not present

## 2019-08-20 DIAGNOSIS — K652 Spontaneous bacterial peritonitis: Secondary | ICD-10-CM | POA: Diagnosis not present

## 2019-08-20 DIAGNOSIS — I2510 Atherosclerotic heart disease of native coronary artery without angina pectoris: Secondary | ICD-10-CM | POA: Diagnosis not present

## 2019-08-20 DIAGNOSIS — K7290 Hepatic failure, unspecified without coma: Secondary | ICD-10-CM | POA: Diagnosis not present

## 2019-08-20 DIAGNOSIS — N1832 Chronic kidney disease, stage 3b: Secondary | ICD-10-CM | POA: Diagnosis not present

## 2019-08-20 DIAGNOSIS — K858 Other acute pancreatitis without necrosis or infection: Secondary | ICD-10-CM | POA: Diagnosis not present

## 2019-08-20 DIAGNOSIS — I129 Hypertensive chronic kidney disease with stage 1 through stage 4 chronic kidney disease, or unspecified chronic kidney disease: Secondary | ICD-10-CM | POA: Diagnosis not present

## 2019-08-20 DIAGNOSIS — E876 Hypokalemia: Secondary | ICD-10-CM | POA: Diagnosis not present

## 2019-08-20 DIAGNOSIS — I071 Rheumatic tricuspid insufficiency: Secondary | ICD-10-CM | POA: Diagnosis not present

## 2019-08-20 NOTE — Telephone Encounter (Signed)
(  Dr. Jacinto Reap patient)     Shannon Sawyer w/ [facility] is wanting results of patient's Barium Swallow.  [phone]

## 2019-08-20 NOTE — Telephone Encounter (Signed)
Anderson Malta advised

## 2019-08-20 NOTE — Telephone Encounter (Signed)
Some dysmotility of the esophagus with some reflux.  She has an appointment to see GI at [facility] for this evidently.

## 2019-08-21 DIAGNOSIS — E871 Hypo-osmolality and hyponatremia: Secondary | ICD-10-CM | POA: Diagnosis not present

## 2019-08-21 DIAGNOSIS — I071 Rheumatic tricuspid insufficiency: Secondary | ICD-10-CM | POA: Diagnosis not present

## 2019-08-21 DIAGNOSIS — I129 Hypertensive chronic kidney disease with stage 1 through stage 4 chronic kidney disease, or unspecified chronic kidney disease: Secondary | ICD-10-CM | POA: Diagnosis not present

## 2019-08-21 DIAGNOSIS — E876 Hypokalemia: Secondary | ICD-10-CM | POA: Diagnosis not present

## 2019-08-21 DIAGNOSIS — K7469 Other cirrhosis of liver: Secondary | ICD-10-CM | POA: Diagnosis not present

## 2019-08-21 DIAGNOSIS — K858 Other acute pancreatitis without necrosis or infection: Secondary | ICD-10-CM | POA: Diagnosis not present

## 2019-08-21 DIAGNOSIS — K7290 Hepatic failure, unspecified without coma: Secondary | ICD-10-CM | POA: Diagnosis not present

## 2019-08-21 DIAGNOSIS — I6529 Occlusion and stenosis of unspecified carotid artery: Secondary | ICD-10-CM | POA: Diagnosis not present

## 2019-08-21 DIAGNOSIS — R188 Other ascites: Secondary | ICD-10-CM | POA: Diagnosis not present

## 2019-08-21 DIAGNOSIS — K652 Spontaneous bacterial peritonitis: Secondary | ICD-10-CM | POA: Diagnosis not present

## 2019-08-21 DIAGNOSIS — N1832 Chronic kidney disease, stage 3b: Secondary | ICD-10-CM | POA: Diagnosis not present

## 2019-08-21 DIAGNOSIS — K7581 Nonalcoholic steatohepatitis (NASH): Secondary | ICD-10-CM | POA: Diagnosis not present

## 2019-08-21 DIAGNOSIS — I2510 Atherosclerotic heart disease of native coronary artery without angina pectoris: Secondary | ICD-10-CM | POA: Diagnosis not present

## 2019-08-21 LAB — NUSWAB BV AND CANDIDA, NAA
Atopobium vaginae: HIGH Score — AB
Candida albicans, NAA: NEGATIVE
Candida glabrata, NAA: POSITIVE — AB

## 2019-08-25 DIAGNOSIS — I6529 Occlusion and stenosis of unspecified carotid artery: Secondary | ICD-10-CM | POA: Diagnosis not present

## 2019-08-25 DIAGNOSIS — K7290 Hepatic failure, unspecified without coma: Secondary | ICD-10-CM | POA: Diagnosis not present

## 2019-08-25 DIAGNOSIS — K858 Other acute pancreatitis without necrosis or infection: Secondary | ICD-10-CM | POA: Diagnosis not present

## 2019-08-25 DIAGNOSIS — I2510 Atherosclerotic heart disease of native coronary artery without angina pectoris: Secondary | ICD-10-CM | POA: Diagnosis not present

## 2019-08-25 DIAGNOSIS — K7581 Nonalcoholic steatohepatitis (NASH): Secondary | ICD-10-CM | POA: Diagnosis not present

## 2019-08-25 DIAGNOSIS — E871 Hypo-osmolality and hyponatremia: Secondary | ICD-10-CM | POA: Diagnosis not present

## 2019-08-25 DIAGNOSIS — R188 Other ascites: Secondary | ICD-10-CM | POA: Diagnosis not present

## 2019-08-25 DIAGNOSIS — I129 Hypertensive chronic kidney disease with stage 1 through stage 4 chronic kidney disease, or unspecified chronic kidney disease: Secondary | ICD-10-CM | POA: Diagnosis not present

## 2019-08-25 DIAGNOSIS — N1832 Chronic kidney disease, stage 3b: Secondary | ICD-10-CM | POA: Diagnosis not present

## 2019-08-25 DIAGNOSIS — E876 Hypokalemia: Secondary | ICD-10-CM | POA: Diagnosis not present

## 2019-08-25 DIAGNOSIS — K7469 Other cirrhosis of liver: Secondary | ICD-10-CM | POA: Diagnosis not present

## 2019-08-25 DIAGNOSIS — I071 Rheumatic tricuspid insufficiency: Secondary | ICD-10-CM | POA: Diagnosis not present

## 2019-08-25 DIAGNOSIS — K652 Spontaneous bacterial peritonitis: Secondary | ICD-10-CM | POA: Diagnosis not present

## 2019-08-26 ENCOUNTER — Ambulatory Visit: Admit: 2019-08-26 | Discharge: 2019-08-27 | Payer: MEDICARE

## 2019-08-26 DIAGNOSIS — K7460 Unspecified cirrhosis of liver: Secondary | ICD-10-CM | POA: Diagnosis not present

## 2019-08-26 DIAGNOSIS — K7290 Hepatic failure, unspecified without coma: Secondary | ICD-10-CM | POA: Diagnosis not present

## 2019-08-26 DIAGNOSIS — R188 Other ascites: Secondary | ICD-10-CM | POA: Diagnosis not present

## 2019-08-26 DIAGNOSIS — K7581 Nonalcoholic steatohepatitis (NASH): Secondary | ICD-10-CM | POA: Diagnosis not present

## 2019-08-27 DIAGNOSIS — I129 Hypertensive chronic kidney disease with stage 1 through stage 4 chronic kidney disease, or unspecified chronic kidney disease: Secondary | ICD-10-CM | POA: Diagnosis not present

## 2019-08-27 DIAGNOSIS — K652 Spontaneous bacterial peritonitis: Secondary | ICD-10-CM | POA: Diagnosis not present

## 2019-08-27 DIAGNOSIS — K7469 Other cirrhosis of liver: Secondary | ICD-10-CM | POA: Diagnosis not present

## 2019-08-27 DIAGNOSIS — I071 Rheumatic tricuspid insufficiency: Secondary | ICD-10-CM | POA: Diagnosis not present

## 2019-08-27 DIAGNOSIS — E876 Hypokalemia: Secondary | ICD-10-CM | POA: Diagnosis not present

## 2019-08-27 DIAGNOSIS — R188 Other ascites: Secondary | ICD-10-CM | POA: Diagnosis not present

## 2019-08-27 DIAGNOSIS — I6529 Occlusion and stenosis of unspecified carotid artery: Secondary | ICD-10-CM | POA: Diagnosis not present

## 2019-08-27 DIAGNOSIS — E871 Hypo-osmolality and hyponatremia: Secondary | ICD-10-CM | POA: Diagnosis not present

## 2019-08-27 DIAGNOSIS — I2510 Atherosclerotic heart disease of native coronary artery without angina pectoris: Secondary | ICD-10-CM | POA: Diagnosis not present

## 2019-08-27 DIAGNOSIS — K858 Other acute pancreatitis without necrosis or infection: Secondary | ICD-10-CM | POA: Diagnosis not present

## 2019-08-27 DIAGNOSIS — K7581 Nonalcoholic steatohepatitis (NASH): Secondary | ICD-10-CM | POA: Diagnosis not present

## 2019-08-27 DIAGNOSIS — K7290 Hepatic failure, unspecified without coma: Secondary | ICD-10-CM | POA: Diagnosis not present

## 2019-08-27 DIAGNOSIS — N1832 Chronic kidney disease, stage 3b: Secondary | ICD-10-CM | POA: Diagnosis not present

## 2019-08-27 MED ORDER — LOSARTAN 25 MG TABLET
0.00000 days
Start: 2019-08-27 — End: ?

## 2019-08-28 DIAGNOSIS — E876 Hypokalemia: Secondary | ICD-10-CM | POA: Diagnosis not present

## 2019-08-28 DIAGNOSIS — I2510 Atherosclerotic heart disease of native coronary artery without angina pectoris: Secondary | ICD-10-CM | POA: Diagnosis not present

## 2019-08-28 DIAGNOSIS — R188 Other ascites: Secondary | ICD-10-CM | POA: Diagnosis not present

## 2019-08-28 DIAGNOSIS — N1832 Chronic kidney disease, stage 3b: Secondary | ICD-10-CM | POA: Diagnosis not present

## 2019-08-28 DIAGNOSIS — K652 Spontaneous bacterial peritonitis: Secondary | ICD-10-CM | POA: Diagnosis not present

## 2019-08-28 DIAGNOSIS — K7581 Nonalcoholic steatohepatitis (NASH): Secondary | ICD-10-CM | POA: Diagnosis not present

## 2019-08-28 DIAGNOSIS — I6529 Occlusion and stenosis of unspecified carotid artery: Secondary | ICD-10-CM | POA: Diagnosis not present

## 2019-08-28 DIAGNOSIS — K7290 Hepatic failure, unspecified without coma: Secondary | ICD-10-CM | POA: Diagnosis not present

## 2019-08-28 DIAGNOSIS — I071 Rheumatic tricuspid insufficiency: Secondary | ICD-10-CM | POA: Diagnosis not present

## 2019-08-28 DIAGNOSIS — K858 Other acute pancreatitis without necrosis or infection: Secondary | ICD-10-CM | POA: Diagnosis not present

## 2019-08-28 DIAGNOSIS — I129 Hypertensive chronic kidney disease with stage 1 through stage 4 chronic kidney disease, or unspecified chronic kidney disease: Secondary | ICD-10-CM | POA: Diagnosis not present

## 2019-08-28 DIAGNOSIS — K7469 Other cirrhosis of liver: Secondary | ICD-10-CM | POA: Diagnosis not present

## 2019-08-28 DIAGNOSIS — E871 Hypo-osmolality and hyponatremia: Secondary | ICD-10-CM | POA: Diagnosis not present

## 2019-08-31 DIAGNOSIS — K7460 Unspecified cirrhosis of liver: Principal | ICD-10-CM

## 2019-08-31 MED ORDER — NADOLOL 20 MG TABLET
ORAL_TABLET | 3 refills | 0 days
Start: 2019-08-31 — End: ?

## 2019-09-01 ENCOUNTER — Telehealth: Payer: Self-pay | Admitting: Family Medicine

## 2019-09-01 DIAGNOSIS — K7581 Nonalcoholic steatohepatitis (NASH): Secondary | ICD-10-CM | POA: Diagnosis not present

## 2019-09-01 DIAGNOSIS — N1832 Chronic kidney disease, stage 3b: Secondary | ICD-10-CM | POA: Diagnosis not present

## 2019-09-01 DIAGNOSIS — I6529 Occlusion and stenosis of unspecified carotid artery: Secondary | ICD-10-CM | POA: Diagnosis not present

## 2019-09-01 DIAGNOSIS — I2510 Atherosclerotic heart disease of native coronary artery without angina pectoris: Secondary | ICD-10-CM | POA: Diagnosis not present

## 2019-09-01 DIAGNOSIS — I071 Rheumatic tricuspid insufficiency: Secondary | ICD-10-CM | POA: Diagnosis not present

## 2019-09-01 DIAGNOSIS — R188 Other ascites: Secondary | ICD-10-CM | POA: Diagnosis not present

## 2019-09-01 DIAGNOSIS — K858 Other acute pancreatitis without necrosis or infection: Secondary | ICD-10-CM | POA: Diagnosis not present

## 2019-09-01 DIAGNOSIS — K652 Spontaneous bacterial peritonitis: Secondary | ICD-10-CM | POA: Diagnosis not present

## 2019-09-01 DIAGNOSIS — E876 Hypokalemia: Secondary | ICD-10-CM | POA: Diagnosis not present

## 2019-09-01 DIAGNOSIS — K7290 Hepatic failure, unspecified without coma: Secondary | ICD-10-CM | POA: Diagnosis not present

## 2019-09-01 DIAGNOSIS — K7469 Other cirrhosis of liver: Secondary | ICD-10-CM | POA: Diagnosis not present

## 2019-09-01 DIAGNOSIS — I129 Hypertensive chronic kidney disease with stage 1 through stage 4 chronic kidney disease, or unspecified chronic kidney disease: Secondary | ICD-10-CM | POA: Diagnosis not present

## 2019-09-01 DIAGNOSIS — E871 Hypo-osmolality and hyponatremia: Secondary | ICD-10-CM | POA: Diagnosis not present

## 2019-09-01 NOTE — Telephone Encounter (Signed)
Called to get verbal orders to extend PT - 1x wk 5.  Please leave a VM on secured line if necessary.  CB# [phone]

## 2019-09-02 NOTE — Telephone Encounter (Signed)
Left message advising Shannon Sawyer  Thanks,   -Mickel Baas

## 2019-09-02 NOTE — Telephone Encounter (Signed)
Please review for Dr. Newell Coral,   -Mickel Baas

## 2019-09-02 NOTE — Telephone Encounter (Signed)
That's fine

## 2019-09-04 DIAGNOSIS — K652 Spontaneous bacterial peritonitis: Secondary | ICD-10-CM | POA: Diagnosis not present

## 2019-09-04 DIAGNOSIS — E871 Hypo-osmolality and hyponatremia: Secondary | ICD-10-CM | POA: Diagnosis not present

## 2019-09-04 DIAGNOSIS — K858 Other acute pancreatitis without necrosis or infection: Secondary | ICD-10-CM | POA: Diagnosis not present

## 2019-09-04 DIAGNOSIS — I071 Rheumatic tricuspid insufficiency: Secondary | ICD-10-CM | POA: Diagnosis not present

## 2019-09-04 DIAGNOSIS — I2510 Atherosclerotic heart disease of native coronary artery without angina pectoris: Secondary | ICD-10-CM | POA: Diagnosis not present

## 2019-09-04 DIAGNOSIS — N1832 Chronic kidney disease, stage 3b: Secondary | ICD-10-CM | POA: Diagnosis not present

## 2019-09-04 DIAGNOSIS — K7581 Nonalcoholic steatohepatitis (NASH): Secondary | ICD-10-CM | POA: Diagnosis not present

## 2019-09-04 DIAGNOSIS — I129 Hypertensive chronic kidney disease with stage 1 through stage 4 chronic kidney disease, or unspecified chronic kidney disease: Secondary | ICD-10-CM | POA: Diagnosis not present

## 2019-09-04 DIAGNOSIS — K7469 Other cirrhosis of liver: Secondary | ICD-10-CM | POA: Diagnosis not present

## 2019-09-04 DIAGNOSIS — K7290 Hepatic failure, unspecified without coma: Secondary | ICD-10-CM | POA: Diagnosis not present

## 2019-09-04 DIAGNOSIS — I6529 Occlusion and stenosis of unspecified carotid artery: Secondary | ICD-10-CM | POA: Diagnosis not present

## 2019-09-04 DIAGNOSIS — R188 Other ascites: Secondary | ICD-10-CM | POA: Diagnosis not present

## 2019-09-04 DIAGNOSIS — E876 Hypokalemia: Secondary | ICD-10-CM | POA: Diagnosis not present

## 2019-09-09 ENCOUNTER — Encounter: Admit: 2019-09-09 | Discharge: 2019-09-10 | Payer: MEDICARE

## 2019-09-09 DIAGNOSIS — K7581 Nonalcoholic steatohepatitis (NASH): Secondary | ICD-10-CM | POA: Diagnosis not present

## 2019-09-09 DIAGNOSIS — K7460 Unspecified cirrhosis of liver: Secondary | ICD-10-CM | POA: Diagnosis not present

## 2019-09-09 DIAGNOSIS — K7290 Hepatic failure, unspecified without coma: Secondary | ICD-10-CM | POA: Diagnosis not present

## 2019-09-09 DIAGNOSIS — R188 Other ascites: Secondary | ICD-10-CM | POA: Diagnosis not present

## 2019-09-10 DIAGNOSIS — B351 Tinea unguium: Secondary | ICD-10-CM | POA: Diagnosis not present

## 2019-09-10 DIAGNOSIS — M79674 Pain in right toe(s): Secondary | ICD-10-CM | POA: Diagnosis not present

## 2019-09-10 DIAGNOSIS — M79675 Pain in left toe(s): Secondary | ICD-10-CM | POA: Diagnosis not present

## 2019-09-10 DIAGNOSIS — L600 Ingrowing nail: Secondary | ICD-10-CM | POA: Diagnosis not present

## 2019-09-11 DIAGNOSIS — N1832 Chronic kidney disease, stage 3b: Secondary | ICD-10-CM | POA: Diagnosis not present

## 2019-09-11 DIAGNOSIS — K7581 Nonalcoholic steatohepatitis (NASH): Secondary | ICD-10-CM | POA: Diagnosis not present

## 2019-09-11 DIAGNOSIS — E871 Hypo-osmolality and hyponatremia: Secondary | ICD-10-CM | POA: Diagnosis not present

## 2019-09-11 DIAGNOSIS — R188 Other ascites: Secondary | ICD-10-CM | POA: Diagnosis not present

## 2019-09-11 DIAGNOSIS — I129 Hypertensive chronic kidney disease with stage 1 through stage 4 chronic kidney disease, or unspecified chronic kidney disease: Secondary | ICD-10-CM | POA: Diagnosis not present

## 2019-09-11 DIAGNOSIS — I071 Rheumatic tricuspid insufficiency: Secondary | ICD-10-CM | POA: Diagnosis not present

## 2019-09-11 DIAGNOSIS — K7290 Hepatic failure, unspecified without coma: Secondary | ICD-10-CM | POA: Diagnosis not present

## 2019-09-11 DIAGNOSIS — E876 Hypokalemia: Secondary | ICD-10-CM | POA: Diagnosis not present

## 2019-09-11 DIAGNOSIS — K652 Spontaneous bacterial peritonitis: Secondary | ICD-10-CM | POA: Diagnosis not present

## 2019-09-11 DIAGNOSIS — K7469 Other cirrhosis of liver: Secondary | ICD-10-CM | POA: Diagnosis not present

## 2019-09-11 DIAGNOSIS — I2510 Atherosclerotic heart disease of native coronary artery without angina pectoris: Secondary | ICD-10-CM | POA: Diagnosis not present

## 2019-09-11 DIAGNOSIS — K858 Other acute pancreatitis without necrosis or infection: Secondary | ICD-10-CM | POA: Diagnosis not present

## 2019-09-11 DIAGNOSIS — I6529 Occlusion and stenosis of unspecified carotid artery: Secondary | ICD-10-CM | POA: Diagnosis not present

## 2019-09-17 ENCOUNTER — Encounter: Admit: 2019-09-17 | Discharge: 2019-09-18 | Payer: MEDICARE | Attending: Gastroenterology | Primary: Gastroenterology

## 2019-09-17 DIAGNOSIS — K7460 Unspecified cirrhosis of liver: Secondary | ICD-10-CM | POA: Diagnosis not present

## 2019-09-17 DIAGNOSIS — R188 Other ascites: Secondary | ICD-10-CM | POA: Diagnosis not present

## 2019-09-17 DIAGNOSIS — K7290 Hepatic failure, unspecified without coma: Secondary | ICD-10-CM | POA: Diagnosis not present

## 2019-09-17 DIAGNOSIS — K7581 Nonalcoholic steatohepatitis (NASH): Secondary | ICD-10-CM | POA: Diagnosis not present

## 2019-09-17 MED ORDER — MIRTAZAPINE 15 MG TABLET
ORAL_TABLET | Freq: Every evening | ORAL | 3 refills | 60.00000 days | Status: CP
Start: 2019-09-17 — End: ?

## 2019-09-17 MED ORDER — AMILORIDE 5 MG TABLET
ORAL_TABLET | Freq: Two times a day (BID) | ORAL | 11 refills | 30.00000 days | Status: CP
Start: 2019-09-17 — End: 2020-09-16

## 2019-09-17 MED ORDER — FUROSEMIDE 20 MG TABLET
ORAL_TABLET | Freq: Every day | ORAL | 11 refills | 30 days | Status: CP
Start: 2019-09-17 — End: 2020-09-16

## 2019-09-21 ENCOUNTER — Telehealth: Payer: Self-pay | Admitting: Family Medicine

## 2019-09-21 DIAGNOSIS — K652 Spontaneous bacterial peritonitis: Secondary | ICD-10-CM | POA: Diagnosis not present

## 2019-09-21 DIAGNOSIS — I129 Hypertensive chronic kidney disease with stage 1 through stage 4 chronic kidney disease, or unspecified chronic kidney disease: Secondary | ICD-10-CM | POA: Diagnosis not present

## 2019-09-21 DIAGNOSIS — I6529 Occlusion and stenosis of unspecified carotid artery: Secondary | ICD-10-CM | POA: Diagnosis not present

## 2019-09-21 DIAGNOSIS — K7290 Hepatic failure, unspecified without coma: Secondary | ICD-10-CM | POA: Diagnosis not present

## 2019-09-21 DIAGNOSIS — R188 Other ascites: Secondary | ICD-10-CM | POA: Diagnosis not present

## 2019-09-21 DIAGNOSIS — E871 Hypo-osmolality and hyponatremia: Secondary | ICD-10-CM | POA: Diagnosis not present

## 2019-09-21 DIAGNOSIS — I2510 Atherosclerotic heart disease of native coronary artery without angina pectoris: Secondary | ICD-10-CM | POA: Diagnosis not present

## 2019-09-21 DIAGNOSIS — N1832 Chronic kidney disease, stage 3b: Secondary | ICD-10-CM | POA: Diagnosis not present

## 2019-09-21 DIAGNOSIS — E876 Hypokalemia: Secondary | ICD-10-CM | POA: Diagnosis not present

## 2019-09-21 DIAGNOSIS — K7469 Other cirrhosis of liver: Secondary | ICD-10-CM | POA: Diagnosis not present

## 2019-09-21 DIAGNOSIS — K7581 Nonalcoholic steatohepatitis (NASH): Secondary | ICD-10-CM | POA: Diagnosis not present

## 2019-09-21 DIAGNOSIS — I071 Rheumatic tricuspid insufficiency: Secondary | ICD-10-CM | POA: Diagnosis not present

## 2019-09-21 DIAGNOSIS — K858 Other acute pancreatitis without necrosis or infection: Secondary | ICD-10-CM | POA: Diagnosis not present

## 2019-09-21 NOTE — Telephone Encounter (Signed)
Samantha calling from [facility] to inform Dr. B that patient is taking lasix and a double dose of amiloride. Patient has also begun Lasix infusions. Aldona Bar states fluid was found on patients left lung after completing a dose of the lasix infusion.

## 2019-09-22 NOTE — Telephone Encounter (Signed)
It doesn't look like we manage her fluid pills or have ever managed her fluid pills. If she is volume overloaded or there is a concern with her fluid status then she should be seen by the provider who is prescribing those pills. If they are not available he can be re-evaluated here on a doctor's schedule.

## 2019-09-23 ENCOUNTER — Ambulatory Visit: Admit: 2019-09-23 | Discharge: 2019-09-24 | Payer: MEDICARE

## 2019-09-23 DIAGNOSIS — K7460 Unspecified cirrhosis of liver: Secondary | ICD-10-CM | POA: Diagnosis not present

## 2019-09-23 DIAGNOSIS — Z79899 Other long term (current) drug therapy: Secondary | ICD-10-CM | POA: Diagnosis not present

## 2019-09-23 DIAGNOSIS — K7290 Hepatic failure, unspecified without coma: Secondary | ICD-10-CM | POA: Diagnosis not present

## 2019-09-23 DIAGNOSIS — R188 Other ascites: Secondary | ICD-10-CM | POA: Diagnosis not present

## 2019-09-23 DIAGNOSIS — K7581 Nonalcoholic steatohepatitis (NASH): Secondary | ICD-10-CM | POA: Diagnosis not present

## 2019-09-23 DIAGNOSIS — Z789 Other specified health status: Principal | ICD-10-CM

## 2019-09-23 DIAGNOSIS — N179 Acute kidney failure, unspecified: Principal | ICD-10-CM

## 2019-09-23 DIAGNOSIS — N189 Chronic kidney disease, unspecified: Principal | ICD-10-CM

## 2019-09-25 NOTE — Telephone Encounter (Signed)
L/M for Shannon Sawyer.

## 2019-09-29 DIAGNOSIS — K7469 Other cirrhosis of liver: Secondary | ICD-10-CM | POA: Diagnosis not present

## 2019-09-29 DIAGNOSIS — I6529 Occlusion and stenosis of unspecified carotid artery: Secondary | ICD-10-CM | POA: Diagnosis not present

## 2019-09-29 DIAGNOSIS — R188 Other ascites: Secondary | ICD-10-CM | POA: Diagnosis not present

## 2019-09-29 DIAGNOSIS — E871 Hypo-osmolality and hyponatremia: Secondary | ICD-10-CM | POA: Diagnosis not present

## 2019-09-29 DIAGNOSIS — K652 Spontaneous bacterial peritonitis: Secondary | ICD-10-CM | POA: Diagnosis not present

## 2019-09-29 DIAGNOSIS — I2510 Atherosclerotic heart disease of native coronary artery without angina pectoris: Secondary | ICD-10-CM | POA: Diagnosis not present

## 2019-09-29 DIAGNOSIS — N1832 Chronic kidney disease, stage 3b: Secondary | ICD-10-CM | POA: Diagnosis not present

## 2019-09-29 DIAGNOSIS — K7581 Nonalcoholic steatohepatitis (NASH): Secondary | ICD-10-CM | POA: Diagnosis not present

## 2019-09-29 DIAGNOSIS — K7290 Hepatic failure, unspecified without coma: Secondary | ICD-10-CM | POA: Diagnosis not present

## 2019-09-29 DIAGNOSIS — K858 Other acute pancreatitis without necrosis or infection: Secondary | ICD-10-CM | POA: Diagnosis not present

## 2019-09-29 DIAGNOSIS — I071 Rheumatic tricuspid insufficiency: Secondary | ICD-10-CM | POA: Diagnosis not present

## 2019-09-29 DIAGNOSIS — E876 Hypokalemia: Secondary | ICD-10-CM | POA: Diagnosis not present

## 2019-09-29 DIAGNOSIS — I129 Hypertensive chronic kidney disease with stage 1 through stage 4 chronic kidney disease, or unspecified chronic kidney disease: Secondary | ICD-10-CM | POA: Diagnosis not present

## 2019-09-30 DIAGNOSIS — K7581 Nonalcoholic steatohepatitis (NASH): Principal | ICD-10-CM

## 2019-09-30 DIAGNOSIS — K7290 Hepatic failure, unspecified without coma: Principal | ICD-10-CM

## 2019-09-30 DIAGNOSIS — I8500 Esophageal varices without bleeding: Principal | ICD-10-CM

## 2019-09-30 DIAGNOSIS — K7460 Unspecified cirrhosis of liver: Principal | ICD-10-CM

## 2019-09-30 MED ORDER — LACTULOSE 10 GRAM/15 ML ORAL SOLUTION
Freq: Three times a day (TID) | ORAL | 11 refills | 30.00000 days | Status: CP
Start: 2019-09-30 — End: 2020-09-24

## 2019-10-08 DIAGNOSIS — R188 Other ascites: Principal | ICD-10-CM

## 2019-10-08 DIAGNOSIS — K7460 Unspecified cirrhosis of liver: Principal | ICD-10-CM

## 2019-10-08 DIAGNOSIS — I878 Other specified disorders of veins: Principal | ICD-10-CM

## 2019-10-08 DIAGNOSIS — K7581 Nonalcoholic steatohepatitis (NASH): Principal | ICD-10-CM

## 2019-10-09 ENCOUNTER — Ambulatory Visit: Admit: 2019-10-09 | Discharge: 2019-10-10 | Payer: MEDICARE

## 2019-10-09 DIAGNOSIS — K7460 Unspecified cirrhosis of liver: Secondary | ICD-10-CM

## 2019-10-09 DIAGNOSIS — K7581 Nonalcoholic steatohepatitis (NASH): Principal | ICD-10-CM

## 2019-10-09 DIAGNOSIS — K7290 Hepatic failure, unspecified without coma: Principal | ICD-10-CM

## 2019-10-09 DIAGNOSIS — R188 Other ascites: Principal | ICD-10-CM

## 2019-10-12 ENCOUNTER — Ambulatory Visit: Payer: Medicare Other | Admitting: Obstetrics and Gynecology

## 2019-10-21 ENCOUNTER — Encounter: Admit: 2019-10-21 | Discharge: 2019-10-22 | Payer: MEDICARE

## 2019-10-21 DIAGNOSIS — R188 Other ascites: Principal | ICD-10-CM

## 2019-10-21 DIAGNOSIS — K7460 Unspecified cirrhosis of liver: Secondary | ICD-10-CM

## 2019-10-21 DIAGNOSIS — K7581 Nonalcoholic steatohepatitis (NASH): Principal | ICD-10-CM

## 2019-10-21 DIAGNOSIS — K7290 Hepatic failure, unspecified without coma: Principal | ICD-10-CM

## 2019-10-21 DIAGNOSIS — R0600 Dyspnea, unspecified: Principal | ICD-10-CM

## 2019-10-21 MED ORDER — RIFAXIMIN 550 MG TABLET
ORAL_TABLET | Freq: Two times a day (BID) | ORAL | 11 refills | 30 days | Status: CP
Start: 2019-10-21 — End: ?

## 2019-10-29 ENCOUNTER — Ambulatory Visit: Payer: Medicare Other | Admitting: Family Medicine

## 2019-11-03 ENCOUNTER — Ambulatory Visit: Admit: 2019-11-03 | Discharge: 2019-11-04 | Payer: MEDICARE

## 2019-11-05 ENCOUNTER — Encounter: Admit: 2019-11-05 | Discharge: 2019-11-05 | Payer: MEDICARE

## 2019-11-05 DIAGNOSIS — N179 Acute kidney failure, unspecified: Principal | ICD-10-CM

## 2019-11-05 DIAGNOSIS — K7460 Unspecified cirrhosis of liver: Principal | ICD-10-CM

## 2019-11-05 DIAGNOSIS — K7581 Nonalcoholic steatohepatitis (NASH): Principal | ICD-10-CM

## 2019-11-05 DIAGNOSIS — N189 Chronic kidney disease, unspecified: Principal | ICD-10-CM

## 2019-11-05 DIAGNOSIS — Z789 Other specified health status: Principal | ICD-10-CM

## 2019-11-06 DIAGNOSIS — K7460 Unspecified cirrhosis of liver: Principal | ICD-10-CM

## 2019-11-06 DIAGNOSIS — K7290 Hepatic failure, unspecified without coma: Principal | ICD-10-CM

## 2019-11-06 DIAGNOSIS — R188 Other ascites: Principal | ICD-10-CM

## 2019-11-06 DIAGNOSIS — K7581 Nonalcoholic steatohepatitis (NASH): Principal | ICD-10-CM

## 2019-11-06 MED ORDER — AMILORIDE 5 MG TABLET
ORAL_TABLET | Freq: Two times a day (BID) | ORAL | 11 refills | 30.00000 days | Status: CP
Start: 2019-11-06 — End: 2020-11-05

## 2019-11-06 MED ORDER — RIFAXIMIN 550 MG TABLET
ORAL_TABLET | Freq: Two times a day (BID) | ORAL | 11 refills | 30.00000 days | Status: CP
Start: 2019-11-06 — End: ?

## 2019-11-10 DIAGNOSIS — K7290 Hepatic failure, unspecified without coma: Principal | ICD-10-CM

## 2019-11-10 DIAGNOSIS — K7460 Unspecified cirrhosis of liver: Principal | ICD-10-CM

## 2019-11-11 ENCOUNTER — Ambulatory Visit: Admit: 2019-11-11 | Discharge: 2019-11-12 | Payer: MEDICARE

## 2019-11-11 DIAGNOSIS — K7581 Nonalcoholic steatohepatitis (NASH): Principal | ICD-10-CM

## 2019-11-11 DIAGNOSIS — K7460 Unspecified cirrhosis of liver: Secondary | ICD-10-CM

## 2019-11-11 DIAGNOSIS — R188 Other ascites: Principal | ICD-10-CM

## 2019-11-11 DIAGNOSIS — K7290 Hepatic failure, unspecified without coma: Principal | ICD-10-CM

## 2019-11-19 ENCOUNTER — Encounter: Admit: 2019-11-19 | Discharge: 2019-11-20 | Payer: MEDICARE | Attending: Gastroenterology | Primary: Gastroenterology

## 2019-11-19 DIAGNOSIS — K7460 Unspecified cirrhosis of liver: Principal | ICD-10-CM

## 2019-11-19 DIAGNOSIS — R188 Other ascites: Principal | ICD-10-CM

## 2019-11-19 DIAGNOSIS — K7581 Nonalcoholic steatohepatitis (NASH): Principal | ICD-10-CM

## 2019-11-19 MED ORDER — LEVOFLOXACIN 250 MG TABLET
ORAL_TABLET | Freq: Every day | ORAL | 5 refills | 30.00000 days | Status: CP
Start: 2019-11-19 — End: ?

## 2019-11-24 DIAGNOSIS — K7290 Hepatic failure, unspecified without coma: Principal | ICD-10-CM

## 2019-11-24 DIAGNOSIS — K7460 Unspecified cirrhosis of liver: Principal | ICD-10-CM

## 2019-11-24 DIAGNOSIS — K7581 Nonalcoholic steatohepatitis (NASH): Principal | ICD-10-CM

## 2019-11-24 DIAGNOSIS — I8500 Esophageal varices without bleeding: Principal | ICD-10-CM

## 2019-11-24 MED ORDER — MIRTAZAPINE 15 MG TABLET
ORAL_TABLET | Freq: Every evening | ORAL | 2 refills | 30 days | Status: CP
Start: 2019-11-24 — End: 2020-02-22

## 2019-11-24 MED ORDER — ONDANSETRON HCL 4 MG TABLET
ORAL_TABLET | Freq: Three times a day (TID) | ORAL | 2 refills | 30.00000 days | Status: CP | PRN
Start: 2019-11-24 — End: 2020-02-22

## 2019-11-24 MED ORDER — FUROSEMIDE 20 MG TABLET
ORAL_TABLET | Freq: Every day | ORAL | 8 refills | 30.00000 days | Status: CP
Start: 2019-11-24 — End: 2020-08-20

## 2019-11-24 MED ORDER — LACTULOSE 10 GRAM/15 ML ORAL SOLUTION
Freq: Three times a day (TID) | ORAL | 8 refills | 30.00000 days | Status: CP
Start: 2019-11-24 — End: 2020-08-20

## 2019-11-24 MED ORDER — PROPRANOLOL 10 MG TABLET
ORAL_TABLET | Freq: Two times a day (BID) | ORAL | 2 refills | 30 days | Status: CP
Start: 2019-11-24 — End: 2020-02-22

## 2019-11-25 ENCOUNTER — Encounter: Admit: 2019-11-25 | Discharge: 2019-11-26 | Payer: MEDICARE

## 2019-11-25 DIAGNOSIS — K7581 Nonalcoholic steatohepatitis (NASH): Principal | ICD-10-CM

## 2019-11-25 DIAGNOSIS — K7460 Unspecified cirrhosis of liver: Principal | ICD-10-CM

## 2019-11-25 DIAGNOSIS — K7290 Hepatic failure, unspecified without coma: Principal | ICD-10-CM

## 2019-11-25 DIAGNOSIS — R188 Other ascites: Principal | ICD-10-CM

## 2019-12-09 ENCOUNTER — Ambulatory Visit: Admit: 2019-12-09 | Discharge: 2019-12-10 | Payer: MEDICARE

## 2019-12-09 DIAGNOSIS — R188 Other ascites: Principal | ICD-10-CM

## 2019-12-09 DIAGNOSIS — K7581 Nonalcoholic steatohepatitis (NASH): Secondary | ICD-10-CM

## 2019-12-09 DIAGNOSIS — K7290 Hepatic failure, unspecified without coma: Principal | ICD-10-CM

## 2019-12-09 DIAGNOSIS — K7460 Unspecified cirrhosis of liver: Principal | ICD-10-CM

## 2019-12-23 ENCOUNTER — Encounter: Admit: 2019-12-23 | Discharge: 2019-12-24 | Payer: MEDICARE

## 2020-01-06 ENCOUNTER — Ambulatory Visit: Admit: 2020-01-06 | Discharge: 2020-01-07 | Payer: MEDICARE

## 2020-01-06 DIAGNOSIS — R188 Other ascites: Principal | ICD-10-CM

## 2020-01-06 DIAGNOSIS — K7460 Unspecified cirrhosis of liver: Principal | ICD-10-CM

## 2020-01-06 DIAGNOSIS — K7581 Nonalcoholic steatohepatitis (NASH): Principal | ICD-10-CM

## 2020-01-06 DIAGNOSIS — Z5181 Encounter for therapeutic drug level monitoring: Principal | ICD-10-CM

## 2020-01-06 DIAGNOSIS — K7290 Hepatic failure, unspecified without coma: Principal | ICD-10-CM

## 2020-01-14 ENCOUNTER — Other Ambulatory Visit: Payer: Self-pay | Admitting: Internal Medicine

## 2020-01-14 DIAGNOSIS — Z1231 Encounter for screening mammogram for malignant neoplasm of breast: Secondary | ICD-10-CM

## 2020-01-19 ENCOUNTER — Other Ambulatory Visit: Payer: Self-pay

## 2020-01-19 ENCOUNTER — Ambulatory Visit
Admission: RE | Admit: 2020-01-19 | Discharge: 2020-01-19 | Disposition: A | Discharge status: Home / Self Care | Payer: Medicare Other | Source: Ambulatory Visit | Attending: Internal Medicine | Admitting: Internal Medicine

## 2020-01-19 DIAGNOSIS — Z1231 Encounter for screening mammogram for malignant neoplasm of breast: Secondary | ICD-10-CM | POA: Insufficient documentation

## 2020-01-20 ENCOUNTER — Ambulatory Visit: Admit: 2020-01-20 | Discharge: 2020-01-21 | Payer: MEDICARE

## 2020-01-20 DIAGNOSIS — Z5181 Encounter for therapeutic drug level monitoring: Principal | ICD-10-CM

## 2020-01-20 DIAGNOSIS — K7290 Hepatic failure, unspecified without coma: Principal | ICD-10-CM

## 2020-01-20 DIAGNOSIS — R188 Other ascites: Principal | ICD-10-CM

## 2020-01-20 DIAGNOSIS — K7460 Unspecified cirrhosis of liver: Principal | ICD-10-CM

## 2020-01-20 DIAGNOSIS — K7581 Nonalcoholic steatohepatitis (NASH): Principal | ICD-10-CM

## 2020-01-22 DIAGNOSIS — K7581 Nonalcoholic steatohepatitis (NASH): Principal | ICD-10-CM

## 2020-01-22 DIAGNOSIS — K7460 Unspecified cirrhosis of liver: Principal | ICD-10-CM

## 2020-01-29 MED ORDER — MIRTAZAPINE 15 MG TABLET
ORAL_TABLET | 2 refills | 0 days
Start: 2020-01-29 — End: ?

## 2020-02-01 ENCOUNTER — Other Ambulatory Visit: Payer: Self-pay

## 2020-02-01 MED ORDER — MIRTAZAPINE 15 MG TABLET
ORAL_TABLET | 2 refills | 0 days
Start: 2020-02-01 — End: ?

## 2020-02-03 ENCOUNTER — Ambulatory Visit: Payer: Medicare Other | Admitting: Gastroenterology

## 2020-02-03 ENCOUNTER — Encounter: Admit: 2020-02-03 | Discharge: 2020-02-04 | Payer: MEDICARE

## 2020-02-03 DIAGNOSIS — R188 Other ascites: Principal | ICD-10-CM

## 2020-02-03 DIAGNOSIS — K7460 Unspecified cirrhosis of liver: Principal | ICD-10-CM

## 2020-02-03 DIAGNOSIS — K7581 Nonalcoholic steatohepatitis (NASH): Principal | ICD-10-CM

## 2020-02-03 DIAGNOSIS — K7290 Hepatic failure, unspecified without coma: Principal | ICD-10-CM

## 2020-02-17 ENCOUNTER — Encounter: Admit: 2020-02-17 | Discharge: 2020-02-18 | Payer: MEDICARE

## 2020-02-17 DIAGNOSIS — K7290 Hepatic failure, unspecified without coma: Principal | ICD-10-CM

## 2020-02-17 DIAGNOSIS — N189 Chronic kidney disease, unspecified: Principal | ICD-10-CM

## 2020-02-17 DIAGNOSIS — N1832 Stage 3b chronic kidney disease (CMS-HCC): Principal | ICD-10-CM

## 2020-02-17 DIAGNOSIS — N179 Acute kidney failure, unspecified: Principal | ICD-10-CM

## 2020-02-17 DIAGNOSIS — R188 Other ascites: Principal | ICD-10-CM

## 2020-02-17 DIAGNOSIS — K7460 Unspecified cirrhosis of liver: Principal | ICD-10-CM

## 2020-02-17 DIAGNOSIS — K7581 Nonalcoholic steatohepatitis (NASH): Principal | ICD-10-CM

## 2020-02-18 DIAGNOSIS — N1832 Stage 3b chronic kidney disease (CMS-HCC): Principal | ICD-10-CM

## 2020-02-18 DIAGNOSIS — N179 Acute kidney failure, unspecified: Principal | ICD-10-CM

## 2020-02-18 DIAGNOSIS — K7290 Hepatic failure, unspecified without coma: Principal | ICD-10-CM

## 2020-02-18 DIAGNOSIS — G479 Sleep disorder, unspecified: Principal | ICD-10-CM

## 2020-02-18 DIAGNOSIS — R188 Other ascites: Principal | ICD-10-CM

## 2020-02-18 DIAGNOSIS — Z1289 Encounter for screening for malignant neoplasm of other sites: Principal | ICD-10-CM

## 2020-02-18 DIAGNOSIS — K7460 Unspecified cirrhosis of liver: Principal | ICD-10-CM

## 2020-02-18 DIAGNOSIS — N189 Chronic kidney disease, unspecified: Principal | ICD-10-CM

## 2020-02-18 DIAGNOSIS — K7581 Nonalcoholic steatohepatitis (NASH): Principal | ICD-10-CM

## 2020-02-18 MED ORDER — MIRTAZAPINE 15 MG TABLET
ORAL_TABLET | Freq: Every evening | ORAL | 1 refills | 30.00000 days | Status: CP
Start: 2020-02-18 — End: 2020-02-18

## 2020-02-18 MED ORDER — MIRTAZAPINE 15 MG TABLET: 30 mg | tablet | Freq: Every evening | 1 refills | 30 days | Status: AC

## 2020-03-02 ENCOUNTER — Encounter: Admit: 2020-03-02 | Discharge: 2020-03-03 | Payer: MEDICARE

## 2020-03-02 DIAGNOSIS — K7460 Unspecified cirrhosis of liver: Principal | ICD-10-CM

## 2020-03-02 DIAGNOSIS — K7581 Nonalcoholic steatohepatitis (NASH): Principal | ICD-10-CM

## 2020-03-02 DIAGNOSIS — R188 Other ascites: Principal | ICD-10-CM

## 2020-03-02 DIAGNOSIS — K7290 Hepatic failure, unspecified without coma: Principal | ICD-10-CM

## 2020-03-10 ENCOUNTER — Ambulatory Visit: Payer: Medicare Other | Admitting: Gastroenterology

## 2020-03-10 ENCOUNTER — Encounter: Admit: 2020-03-10 | Discharge: 2020-03-10 | Payer: MEDICARE | Attending: Gastroenterology | Primary: Gastroenterology

## 2020-03-10 ENCOUNTER — Encounter: Admit: 2020-03-10 | Discharge: 2020-03-10 | Payer: MEDICARE

## 2020-03-10 DIAGNOSIS — Z23 Encounter for immunization: Principal | ICD-10-CM

## 2020-03-10 DIAGNOSIS — M810 Age-related osteoporosis without current pathological fracture: Principal | ICD-10-CM

## 2020-03-10 MED ORDER — SERTRALINE 25 MG TABLET
ORAL_TABLET | Freq: Every day | ORAL | 3 refills | 90 days | Status: CP
Start: 2020-03-10 — End: 2021-03-10

## 2020-03-16 ENCOUNTER — Encounter: Admit: 2020-03-16 | Discharge: 2020-03-17 | Payer: MEDICARE

## 2020-03-16 DIAGNOSIS — K7460 Unspecified cirrhosis of liver: Principal | ICD-10-CM

## 2020-03-16 DIAGNOSIS — N179 Acute kidney failure, unspecified: Principal | ICD-10-CM

## 2020-03-16 DIAGNOSIS — N189 Chronic kidney disease, unspecified: Principal | ICD-10-CM

## 2020-03-16 DIAGNOSIS — G479 Sleep disorder, unspecified: Principal | ICD-10-CM

## 2020-03-16 DIAGNOSIS — M810 Age-related osteoporosis without current pathological fracture: Principal | ICD-10-CM

## 2020-03-16 DIAGNOSIS — K7290 Hepatic failure, unspecified without coma: Principal | ICD-10-CM

## 2020-03-16 DIAGNOSIS — Z1289 Encounter for screening for malignant neoplasm of other sites: Principal | ICD-10-CM

## 2020-03-16 DIAGNOSIS — R188 Other ascites: Principal | ICD-10-CM

## 2020-03-16 DIAGNOSIS — K7581 Nonalcoholic steatohepatitis (NASH): Principal | ICD-10-CM

## 2020-03-16 MED ORDER — MIRTAZAPINE 15 MG TABLET
ORAL_TABLET | 1 refills | 0 days
Start: 2020-03-16 — End: ?

## 2020-03-25 MED ORDER — MIRTAZAPINE 15 MG TABLET
ORAL_TABLET | 1 refills | 0 days
Start: 2020-03-25 — End: ?

## 2020-03-31 ENCOUNTER — Encounter: Admit: 2020-03-31 | Discharge: 2020-04-01 | Payer: MEDICARE

## 2020-03-31 DIAGNOSIS — K7460 Unspecified cirrhosis of liver: Principal | ICD-10-CM

## 2020-03-31 DIAGNOSIS — N189 Chronic kidney disease, unspecified: Principal | ICD-10-CM

## 2020-03-31 DIAGNOSIS — N179 Acute kidney failure, unspecified: Principal | ICD-10-CM

## 2020-03-31 DIAGNOSIS — K7290 Hepatic failure, unspecified without coma: Principal | ICD-10-CM

## 2020-03-31 DIAGNOSIS — R188 Other ascites: Principal | ICD-10-CM

## 2020-03-31 DIAGNOSIS — K7581 Nonalcoholic steatohepatitis (NASH): Principal | ICD-10-CM

## 2020-04-11 DIAGNOSIS — K7290 Hepatic failure, unspecified without coma: Principal | ICD-10-CM

## 2020-04-11 DIAGNOSIS — G479 Sleep disorder, unspecified: Principal | ICD-10-CM

## 2020-04-14 ENCOUNTER — Encounter: Admit: 2020-04-14 | Discharge: 2020-04-15 | Payer: MEDICARE

## 2020-04-14 DIAGNOSIS — R188 Other ascites: Principal | ICD-10-CM

## 2020-04-14 DIAGNOSIS — K7581 Nonalcoholic steatohepatitis (NASH): Principal | ICD-10-CM

## 2020-04-14 DIAGNOSIS — Z79899 Other long term (current) drug therapy: Principal | ICD-10-CM

## 2020-04-14 DIAGNOSIS — K7290 Hepatic failure, unspecified without coma: Principal | ICD-10-CM

## 2020-04-14 DIAGNOSIS — K7460 Unspecified cirrhosis of liver: Principal | ICD-10-CM

## 2020-04-19 ENCOUNTER — Other Ambulatory Visit
Admission: RE | Admit: 2020-04-19 | Discharge: 2020-04-19 | Disposition: A | Discharge status: Home / Self Care | Payer: Medicare Other | Source: Ambulatory Visit | Attending: Gastroenterology | Admitting: Gastroenterology

## 2020-04-19 ENCOUNTER — Other Ambulatory Visit: Payer: Self-pay

## 2020-04-19 ENCOUNTER — Ambulatory Visit: Payer: Medicare Other | Admitting: Gastroenterology

## 2020-04-19 ENCOUNTER — Encounter: Payer: Self-pay | Admitting: Gastroenterology

## 2020-04-19 VITALS — BP 126/50 | HR 57 | Temp 97.5°F | Ht 65.0 in | Wt 135.1 lb

## 2020-04-19 DIAGNOSIS — K7460 Unspecified cirrhosis of liver: Secondary | ICD-10-CM

## 2020-04-19 DIAGNOSIS — K7290 Hepatic failure, unspecified without coma: Secondary | ICD-10-CM | POA: Insufficient documentation

## 2020-04-19 DIAGNOSIS — I8510 Secondary esophageal varices without bleeding: Secondary | ICD-10-CM | POA: Diagnosis not present

## 2020-04-19 DIAGNOSIS — K7581 Nonalcoholic steatohepatitis (NASH): Secondary | ICD-10-CM

## 2020-04-19 DIAGNOSIS — K7682 Hepatic encephalopathy: Secondary | ICD-10-CM

## 2020-04-19 DIAGNOSIS — R188 Other ascites: Secondary | ICD-10-CM | POA: Diagnosis not present

## 2020-04-19 LAB — BASIC METABOLIC PANEL
Anion gap: 14 (ref 5–15)
BUN: 150 mg/dL — ABNORMAL HIGH (ref 8–23)
CO2: 16 mmol/L — ABNORMAL LOW (ref 22–32)
Calcium: 9.6 mg/dL (ref 8.9–10.3)
Chloride: 96 mmol/L — ABNORMAL LOW (ref 98–111)
Creatinine, Ser: 2.6 mg/dL — ABNORMAL HIGH (ref 0.44–1.00)
GFR, Estimated: 19 mL/min — ABNORMAL LOW (ref >60)
Glucose, Bld: 106 mg/dL — ABNORMAL HIGH (ref 70–99)
Potassium: 5.1 mmol/L (ref 3.5–5.1)
Sodium: 126 mmol/L — ABNORMAL LOW (ref 135–145)

## 2020-04-19 MED ORDER — ALBUMIN HUMAN 25 % IV SOLN: INTRAVENOUS | 0 refills | Status: DC

## 2020-04-19 NOTE — Progress Notes (Signed)
Cephas Darby, MD [address]  [place]  [place], [place] [address]  Main: [phone]  Fax: [phone]    Gastroenterology Consultation  Referring Provider:     Gladstone Lighter, MD Primary Care Physician:  Gladstone Lighter, MD Primary Gastroenterologist:  Dr. Rayvon Char at [facility] Reason for Consultation:     Decompensated cirrhosis of liver        HPI:   Shannon Sawyer is a 75 y.o. female referred by Dr. Gladstone Lighter, MD  for consultation & management of decompensated Karlene Lineman cirrhosis with ascites, hepatic encephalopathy.  Patient was previously under the care of [facility] transplant hepatology, later switched to [facility], transplant hepatology service.  Patient is followed by Dr. Rayvon Char at [facility].  She has history of diuretic responsive ascites, history of nonbleeding esophageal varices on nonselective beta-blocker, hepatic encephalopathy on lactulose and Xifaxan, history of carotid artery stenosis, tricuspid regurgitation, history of coronary artery disease s/p PCIx2.  Patient is diagnosed with cirrhosis about 10 years ago.  Patient has been switched from loop diuretic to amiloride due to hyponatremia.  She is also on secondary SBP prophylaxis with Levaquin 250 mg daily.  Today, patient is her husband who preferred to establish care with local gastroenterologist for convenience.  Patient's husband reported that she developed profuse diarrhea about 3 to 4 days ago on lactulose.  She had small bowel movements yesterday and today.  Denies any fever, nausea, black stools, rectal bleeding, hematemesis.  Her most recent labs from 04/14/20 at [facility] revealed worsening of BUN/creatinine 101/1.67 from 58/1.19 on 1/27.  Her potassium was 5.1, sodium 128.  These are worse compared to 1/27 labs.  Patient received albumin on 2/10 at [facility].  She has a port.  Patient is here today in wheelchair.  She has limited ambulatory capacity and her husband is the one only taking care of her and  it is overwhelming for him.  Her husband is also concerned about worsening of abdominal distention.  She does have swelling of her both feet.  Patient's husband tells me that his wife is not a liver transplant candidate.  Her MELD-Na is 22.  NSAIDs: None  Antiplts/Anticoagulants/Anti thrombotics: None  GI Procedures: Upper endoscopy 10/08/2016, report not available  Past Medical History:  Diagnosis Date  . Allergy   . Anemia    LOW PLATELET COUNTS  (DR PANDIT [facility])  . Anxiety   . Carotid artery disease (HCC)    BILAT.        Marland Kitchen Chronic cough   . Cirrhosis, nonalcoholic (HCC)    PORTAL HTN, FATTY TISSUE   . Coronary artery disease   . DDD (degenerative disc disease), cervical    SPONDYLOLISTHESIS  . Depression   . GERD (gastroesophageal reflux disease)   . Headache    HX OCCULAR MIGRAINES  . Hypertension    ESSENTIAL  . Myocardial infarction ([place])   . Shortness of breath dyspnea    WITH EXERTION   . Tricuspid insufficiency    MODERATE     Past Surgical History:  Procedure Laterality Date  . ABDOMINAL HYSTERECTOMY    . ANTERIOR CERVICAL DECOMP/DISCECTOMY FUSION N/A 06/03/2014   Procedure: CERVICAL FIVE-CERVICAL SIX, CERVICAL SIX-CERVICAL SEVEN ANTERIOR CERVICAL DECOMPRESSION/DISCECTOMY ;  Surgeon: Karie Chimera, MD;  Location: [place] NEURO ORS;  Service: Neurosurgery;  Laterality: N/A;  . BREAST BIOPSY Left 09/02/2015   benign  . BREAST CYST ASPIRATION Left    neg  . CARDIAC CATHETERIZATION  2 STENTS 2013 IN [place]  . cardiac stents    . ESOPHAGOGASTRODUODENOSCOPY (EGD) WITH PROPOFOL N/A 02/21/2015   Procedure: ESOPHAGOGASTRODUODENOSCOPY (EGD) WITH PROPOFOL;  Surgeon: Josefine Class, MD;  Location: [facility] ENDOSCOPY;  Service: Endoscopy;  Laterality: N/A;  . EYE SURGERY      Current Outpatient Medications:  .  acetaminophen (TYLENOL) 500 MG tablet, Take 500 mg by mouth every 6 (six) hours as needed. , Disp: , Rfl:  .  albumin human 25 % bottle, Inject 50  grams no matter what, Disp: 50 mL, Rfl: 0 .  alendronate (FOSAMAX) 70 MG tablet, Take 70 mg by mouth once a week., Disp: , Rfl:  .  aMILoride (MIDAMOR) 5 MG tablet, Take by mouth., Disp: , Rfl:  .  aspirin EC 81 MG tablet, Take 81 mg by mouth daily. , Disp: , Rfl:  .  furosemide (LASIX) 20 MG tablet, Take 20 mg by mouth daily., Disp: , Rfl:  .  lactulose (CHRONULAC) 10 GM/15ML solution, Take 20 g by mouth daily. , Disp: , Rfl:  .  levofloxacin (LEVAQUIN) 250 MG tablet, Take by mouth., Disp: , Rfl:  .  mirtazapine (REMERON) 15 MG tablet, Take 1 tablet (15 mg total) by mouth at bedtime., Disp: 90 tablet, Rfl: 1 .  nitroGLYCERIN (NITROSTAT) 0.4 MG SL tablet, Place 0.4 mg under the tongue every 5 (five) minutes as needed for chest pain. , Disp: , Rfl:  .  omeprazole (PRILOSEC) 20 MG capsule, Take 20 mg by mouth daily. , Disp: , Rfl:  .  ondansetron (ZOFRAN) 4 MG tablet, Take 4 mg by mouth every 8 (eight) hours as needed., Disp: , Rfl:  .  potassium chloride 20 MEQ/15ML (10%) SOLN, Take by mouth., Disp: , Rfl:  .  propranolol (INDERAL) 10 MG tablet, Take 10 mg by mouth 2 (two) times daily., Disp: , Rfl:  .  rifaximin (XIFAXAN) 550 MG TABS tablet, Take by mouth., Disp: , Rfl:  .  sertraline (ZOLOFT) 25 MG tablet, Take by mouth., Disp: , Rfl:  .  Zinc Sulfate (ZINC 15) 66 MG TABS, Take 1 tablet (66 mg total) by mouth daily. Unsure of dose, Disp: , Rfl: 0   Family History  Problem Relation Age of Onset  . Liver cancer Mother   . Lymphoma Father   . COPD Father   . COPD Brother   . Liver cancer Son   . Breast cancer Neg Hx   . Colon cancer Neg Hx      Social History   Tobacco Use  . Smoking status: Former Smoker    Types: Cigarettes  . Smokeless tobacco: Never Used  . Tobacco comment: formerly tried smoking cigarettes, no regular use  Vaping Use  . Vaping Use: Never used  Substance Use Topics  . Alcohol use: No  . Drug use: No    Allergies as of 04/19/2020 - Review Complete  04/19/2020  Allergen Reaction Noted  . Contrast media  [iodinated diagnostic agents] Rash 09/17/2016  . Azelastine Swelling 05/07/2014  . Flonase [fluticasone propionate] Swelling 05/07/2014  . Prednisone Swelling 05/07/2014  . Shellfish allergy Swelling and Other (See Comments) 01/05/2014  . Tramadol Nausea Only 05/07/2014    Review of Systems:    All systems reviewed and negative except where noted in HPI.   Physical Exam:  BP (!) 126/50 (BP Location: Left Arm, Patient Position: Sitting, Cuff Size: Normal)   Pulse (!) 57   Temp (!) 97.5 F (36.4 C) (Oral)  Ht 5' 5"  (1.651 m)   Wt 135 lb 2 oz (61.3 kg)   BMI 22.49 kg/m  No LMP recorded. Patient has had a hysterectomy.  General:   Alert, thin built, malnourished, ill-appearing in NAD Head:  Normocephalic and atraumatic, bitemporal wasting. Eyes:  Sclera clear, no icterus.   Conjunctiva pink. Ears:  Normal auditory acuity. Nose:  No deformity, discharge, or lesions. Mouth:  No deformity or lesions,oropharynx pink & moist. Neck:  Supple; no masses or thyromegaly. Lungs:  Respirations even and unlabored.  Clear throughout to auscultation.   No wheezes, crackles, or rhonchi. No acute distress. Heart: Bradycardia, regular rhythm; no murmurs, clicks, rubs, or gallops. Abdomen:  Normal bowel sounds. Soft, non-tender and moderately distended, tympanic in the middle, dull in bilateral flanks without masses, hepatosplenomegaly or hernias noted.  No guarding or rebound tenderness.   Rectal: Not performed Msk:  Symmetrical without gross deformities. Good, equal movement & strength bilaterally. Pulses:  Normal pulses noted. Extremities:  No clubbing, 3+ edema.  No cyanosis. Neurologic:  Alert and oriented x1;  grossly normal neurologically. Skin:  Intact without significant lesions or rashes. No jaundice. Psych:  Alert and cooperative. flat affect.  Imaging Studies: Reviewed  Assessment and Plan:   Orena B Hafner is a 75 y.o.  Caucasian female with decompensated Karlene Lineman cirrhosis with ascites, hepatic encephalopathy, chronic kidney disease, history of coronary disease s/p PCI is seen in consultation for decompensated cirrhosis of liver  Decompensated Nash cirrhosis, Childs C, CPT-11, meld sodium 29 as of 04/14/20 Ascites and swelling of legs: Continue current dose of amiloride and spironolactone Recheck BMP today due to recent episode of diarrhea and worsening of renal function, low sodium and elevated potassium Recommend ultrasound-guided paracentesis with 50 g of albumin infusion Increased to have increased fluid intake such as Pedialyte Hold lactulose If her renal function is worsening based on labs today, recommend to hold propranolol as well as Levaquin.  Patient is currently on Levaquin for secondary SBP prophylaxis Hepatic encephalopathy: Continue rifaximin, hold lactulose Esophageal varices, currently on propranolol for primary prophylaxis HCC screening: AFP is normal, no evidence of liver lesions on recent ultrasound Osteoporosis: Will need to follow-up with endocrinology Normal vitamin D levels AKI on CKD Recheck BMP today Patient is not a candidate for TIPS due to hepatic encephalopathy Recommend palliative care consult Overall, poor long-term prognosis without liver transplantation  Follow up in 1 month   Cephas Darby, MD

## 2020-04-20 ENCOUNTER — Telehealth: Payer: Self-pay

## 2020-04-20 ENCOUNTER — Telehealth: Payer: Self-pay | Admitting: Adult Health Nurse Practitioner

## 2020-04-20 NOTE — Telephone Encounter (Signed)
They can do the Paracentesis on 12:30pm [facility]. They have this scheduled for patient. Called patient husband and he verbalized understanding

## 2020-04-20 NOTE — Progress Notes (Signed)
Called patient's husband to discuss about the labs from yesterday.  Creatinine is worsening, serum sodium is still low at 126.  Patient had increased work of breathing yesterday, this morning she was able to get up and walk.  She is planned for therapeutic paracentesis with 50 g of albumin infusion, trying to expedite.  Advised patient's husband to hold Levaquin, amiloride, spironolactone, propranolol as well as lactulose due to worsening creatinine Told him to give lactulose if she does not have any bowel movement today.  She is on Xifaxan for hepatic encephalopathy Will recheck BMP in 2 days, if continues to be worse or patient clinically deteriorates, advised patient to bring her to the ER  Dr Tressia Miners  Can you please arrange palliative care because that's what she needs as she is not a liver transplant candidate and her husband is the only one taking care of her and he seems to be really overwhelmed  Thanks for your help  Rohini Vanga

## 2020-04-20 NOTE — Telephone Encounter (Signed)
-----   Message from Lin Landsman, MD sent at 04/20/2020 10:40 AM EST ----- Called patient's husband to discuss about the labs from yesterday.  Creatinine is worsening, serum sodium is still low at 126.  Patient had increased work of breathing yesterday, this morning she was able to get up and walk.  She is planned for therapeutic paracentesis with 50 g of albumin infusion, trying to expedite.  Advised patient's husband to hold Levaquin, amiloride, spironolactone, propranolol as well as lactulose due to worsening creatinine Told him to give lactulose if she does not have any bowel movement today.  She is on Xifaxan for hepatic encephalopathy Will recheck BMP in 2 days, if continues to be worse or patient clinically deteriorates, advised patient to bring her to the ER  Dr Tressia Miners  Can you please arrange palliative care because that's what she needs as she is not a liver transplant candidate and her husband is the only one taking care of her and he seems to be really overwhelmed  Thanks for your help  Shannon Sawyer

## 2020-04-21 ENCOUNTER — Ambulatory Visit
Admission: RE | Admit: 2020-04-21 | Discharge: 2020-04-21 | Disposition: A | Discharge status: Home / Self Care | Payer: Medicare Other | Source: Ambulatory Visit | Attending: Gastroenterology | Admitting: Gastroenterology

## 2020-04-21 ENCOUNTER — Other Ambulatory Visit: Payer: Self-pay

## 2020-04-21 ENCOUNTER — Other Ambulatory Visit: Payer: Self-pay | Admitting: Gastroenterology

## 2020-04-21 DIAGNOSIS — K7290 Hepatic failure, unspecified without coma: Secondary | ICD-10-CM | POA: Insufficient documentation

## 2020-04-21 DIAGNOSIS — K7460 Unspecified cirrhosis of liver: Secondary | ICD-10-CM

## 2020-04-21 NOTE — Progress Notes (Signed)
Patient presented to [facility] today for an outpatient therapeutic paracentesis. Limited abdominal ultrasound examination demonstrated a small amount of ascites but any potential pocket of fluid was obscured by bowel. No safe percutaneous window identified - no procedure performed. Images and findings were shown/discussed with the patient. Dr. Annamaria Boots notified.   Please call IR with any questions.  Shannon Sawyer, [place] [phone] 04/21/2020, 3:50 PM

## 2020-04-21 NOTE — Telephone Encounter (Signed)
Spoke with patient's husband Sonia Side, regarding the Palliative referral and discussed our services and all questions were answered, he was in agreement with scheduling visit with NP.  I have scheduled an In-home Consult for 04/25/20 @ 11 AM

## 2020-04-22 ENCOUNTER — Other Ambulatory Visit: Payer: Self-pay | Admitting: Gastroenterology

## 2020-04-22 DIAGNOSIS — K7290 Hepatic failure, unspecified without coma: Secondary | ICD-10-CM

## 2020-04-22 DIAGNOSIS — K7460 Unspecified cirrhosis of liver: Secondary | ICD-10-CM

## 2020-04-25 ENCOUNTER — Other Ambulatory Visit: Payer: Self-pay

## 2020-04-25 ENCOUNTER — Other Ambulatory Visit
Admission: RE | Admit: 2020-04-25 | Discharge: 2020-04-25 | Disposition: A | Discharge status: Home / Self Care | Payer: Medicare Other | Source: Home / Self Care | Attending: Gastroenterology | Admitting: Gastroenterology

## 2020-04-25 ENCOUNTER — Telehealth: Payer: Self-pay

## 2020-04-25 ENCOUNTER — Other Ambulatory Visit: Payer: Medicare Other | Admitting: Adult Health Nurse Practitioner

## 2020-04-25 DIAGNOSIS — N189 Chronic kidney disease, unspecified: Secondary | ICD-10-CM | POA: Insufficient documentation

## 2020-04-25 DIAGNOSIS — D72829 Elevated white blood cell count, unspecified: Secondary | ICD-10-CM | POA: Insufficient documentation

## 2020-04-25 DIAGNOSIS — Z515 Encounter for palliative care: Secondary | ICD-10-CM

## 2020-04-25 DIAGNOSIS — D509 Iron deficiency anemia, unspecified: Secondary | ICD-10-CM | POA: Insufficient documentation

## 2020-04-25 DIAGNOSIS — Z79899 Other long term (current) drug therapy: Secondary | ICD-10-CM | POA: Insufficient documentation

## 2020-04-25 DIAGNOSIS — E871 Hypo-osmolality and hyponatremia: Secondary | ICD-10-CM | POA: Insufficient documentation

## 2020-04-25 DIAGNOSIS — N1832 Chronic kidney disease, stage 3b: Secondary | ICD-10-CM

## 2020-04-25 DIAGNOSIS — K7460 Unspecified cirrhosis of liver: Secondary | ICD-10-CM

## 2020-04-25 DIAGNOSIS — K7469 Other cirrhosis of liver: Secondary | ICD-10-CM | POA: Diagnosis not present

## 2020-04-25 DIAGNOSIS — K7290 Hepatic failure, unspecified without coma: Secondary | ICD-10-CM

## 2020-04-25 LAB — COMPREHENSIVE METABOLIC PANEL
ALT: 41 U/L (ref 0–44)
AST: 110 U/L — ABNORMAL HIGH (ref 15–41)
Albumin: 3.7 g/dL (ref 3.5–5.0)
Alkaline Phosphatase: 110 U/L (ref 38–126)
Anion gap: 13 (ref 5–15)
BUN: 152 mg/dL — ABNORMAL HIGH (ref 8–23)
CO2: 16 mmol/L — ABNORMAL LOW (ref 22–32)
Calcium: 9.1 mg/dL (ref 8.9–10.3)
Chloride: 97 mmol/L — ABNORMAL LOW (ref 98–111)
Creatinine, Ser: 1.94 mg/dL — ABNORMAL HIGH (ref 0.44–1.00)
GFR, Estimated: 27 mL/min — ABNORMAL LOW (ref >60)
Glucose, Bld: 130 mg/dL — ABNORMAL HIGH (ref 70–99)
Potassium: 4 mmol/L (ref 3.5–5.1)
Sodium: 126 mmol/L — ABNORMAL LOW (ref 135–145)
Total Bilirubin: 7 mg/dL — ABNORMAL HIGH (ref 0.3–1.2)
Total Protein: 7.3 g/dL (ref 6.5–8.1)

## 2020-04-25 LAB — CBC
HCT: 24.3 % — ABNORMAL LOW (ref 36.0–46.0)
Hemoglobin: 8.9 g/dL — ABNORMAL LOW (ref 12.0–15.0)
MCH: 38.2 pg — ABNORMAL HIGH (ref 26.0–34.0)
MCHC: 36.6 g/dL — ABNORMAL HIGH (ref 30.0–36.0)
MCV: 104.3 fL — ABNORMAL HIGH (ref 80.0–100.0)
Platelets: 45 10*3/uL — ABNORMAL LOW (ref 150–400)
RBC: 2.33 MIL/uL — ABNORMAL LOW (ref 3.87–5.11)
RDW: 17.2 % — ABNORMAL HIGH (ref 11.5–15.5)
WBC: 14.8 10*3/uL — ABNORMAL HIGH (ref 4.0–10.5)
nRBC: 0 % (ref 0.0–0.2)

## 2020-04-25 LAB — MAGNESIUM: Magnesium: 2.5 mg/dL — ABNORMAL HIGH (ref 1.7–2.4)

## 2020-04-25 LAB — PHOSPHORUS: Phosphorus: 4.9 mg/dL — ABNORMAL HIGH (ref 2.5–4.6)

## 2020-04-25 NOTE — Telephone Encounter (Signed)
-----   Message from Lin Landsman, MD sent at 04/22/2020 12:38 PM EST ----- Regarding: Follow-up labs Caryl Pina  Please release CBC, CMP, magnesium to get labs done on 2/21 Inform patient's husband about it  Thanks RV

## 2020-04-25 NOTE — Telephone Encounter (Signed)
Patient husband states she is currently having a nose bleed. He states he will still be giving her the propranolol  unless the blood pressure medication is change. He states that he will [facility] mall [facility]

## 2020-04-25 NOTE — Progress Notes (Signed)
Therapist, nutritional Palliative Care Consult Note Telephone: [phone]  Fax: [phone]  PATIENT NAME: Shannon Sawyer DOB: [birth year] MRN: [id number]  PRIMARY CARE PROVIDER:   Enid Baas, MD  REFERRING PROVIDER:  Dr. Lannette Sawyer, GI  RESPONSIBLE PARTY:   Shannon Sawyer, husband [phone]  Chief complaint: Initial palliative visit for complex medical decision making    RECOMMENDATIONS and PLAN:  1.  Advanced care planning.  Patient DNR.  Spent more than 20 minutes discussing ACP.  After discussion patient voices she wants to be DNR and husband agrees.  Patient has HC POA and living will.  Uploaded DNR, HC POA, and living will to [facility].  2.  Nonalcoholic cirrhosis of the liver.  Patient was being seen by Dr. Piedad Sawyer at [facility].  Established care with Dr. Allegra Sawyer, gastroenterology, who is in closer proximity.  Patient takes Xifaxan and lactulose which is on hold due to kidney function, see below.  Patient receives albumin injections every 2 weeks.  The patient does voice being tired of going to appointments at this time she still wants to continue albumin injections.  Continue follow-up and recommendations by Dr. Allegra Sawyer  3.  CKD stage III.  Last labs showed decreased kidney function and some of her medications have been put on hold including propranolol, amiloride, Lasix, lactulose.  Patient is to go to clinic today for repeat labs.  This will show if the decreased kidney function is related to disease progression or her medications.  Discussed this with the patient and her husband.  4.  Support.  Husband does state feeling overwhelmed taking care of his wife.  Have thought about getting help in the home but does not have the money to pay out-of-pocket.  Have suggested that he reach out to his insurance company to see if they cover any in-home help services.  Husband states that he has applied for food stamps and was approved for $19 a month.  In the  process to see if they qualify for Medicaid.  Patient lives in [place] and will have social worker reach out with appropriate resources for the area.  Did offer her a list of [address] resources as they live close to the [employer].  Patient has significant disease progression and discussed this at length with husband and patient today.  Palliative will continue to monitor for symptom management/decline and make recommendations as needed.  Next appointment is in 4 weeks.  Encouraged to call with any questions or concerns.  HISTORY OF PRESENT ILLNESS:  Shannon Sawyer is a 75 y.o. year old female with multiple medical problems including nonalcoholic cirrhosis of the liver, CKD stage III, CAD, HTN, GERD, depression, esophageal varices. Palliative Care was asked to help address goals of care.  Patient lives at home with her husband.  Reviewed chart including latest labs and imaging.  Patient able to answer questions but has problems with timelines.  Husband provided a lot of information during visit today.  Patient's latest creatinine is 2.6 and meld NA is 22.  In the early part of 2021 patient had 3 hospital visits related to hepatic encephalopathy.  Husband does state that she has had paracentesis for her ascites done a couple of times in the past.  Patient has ascites today.  She was seen by GI last week and ultrasound was done and it was determined that there was not enough fluid to perform paracentesis at that time.  Lab work last  week did show worsening kidney function and her propranolol, amiloride, Lasix, levofloxacin, and lactulose were put on hold.  Husband does state that her systolic blood pressure was in the 140s over the weekend but he was more concerned that her heart rate was increasing as high as 112 and she was urinating more so he did give her 1 propranolol over the weekend.  Patient did have a nosebleed this morning.  Husband states that her appetite is poor and  that she probably eats less than 25% of what she used to eat.  He is able to get 2 ensures and her per day.  Her weight is stable in the 130s.  Patient is unable to lie flat in bed without getting short of breath.  She does have a hospital bed and sleeps with it slightly elevated.  Patient was on lactulose before was held and about 2 to 3 weeks ago started having increased soft bowel movements.  Husband states that whenever she urinates she does passed some bowel movement but not a lot.  She generally has 1 good bowel movement per day per husband.  Patient is able to get up and ambulate but at times has episodes of weakness.  She does get winded walking down the hallway of their home.  Patient does require assistance with bathing and dressing.  She is able to get up and fix herself a drink but is unable to help with cooking or cleaning.  Patient denies pain, headaches, dizziness, melena, hematochezia, dysuria, hematuria.  Rest of 10 point ROS asked and negative  CODE STATUS: DNR  PPS: 40% HOSPICE ELIGIBILITY/DIAGNOSIS: TBD  PHYSICAL EXAM:  BP 121/60 HR 73 O2 94% on room air General: NAD, frail appearing, thin Eyes: Sclera slightly icteric, noninjected, no discharge noted ENMT: Moist mucous membranes Cardiovascular: regular rate and rhythm Pulmonary: Lung sounds clear; normal respiratory effort Abdomen: Distended due to ascites, nontender, hypoactive bowel sounds Extremities: Significant nonpitting edema to bilateral lower legs, no joint deformities Skin: Patient has areas on arms, legs, chest of varying stages of ecchymosis; patient has noted jaundice Neurological: Weakness but otherwise nonfocal; has forgetfulness and not good with timelines   Family History  Problem Relation Age of Onset  . Liver cancer Mother   . Lymphoma Father   . COPD Father   . COPD Brother   . Liver cancer Son   . Breast cancer Neg Hx   . Colon cancer Neg Hx       PAST MEDICAL HISTORY:  Past Medical  History:  Diagnosis Date  . Allergy   . Anemia    LOW PLATELET COUNTS  (DR PANDIT [facility])  . Anxiety   . Carotid artery disease (HCC)    BILAT.        Marland Kitchen Chronic cough   . Cirrhosis, nonalcoholic (HCC)    PORTAL HTN, FATTY TISSUE   . Coronary artery disease   . DDD (degenerative disc disease), cervical    SPONDYLOLISTHESIS  . Depression   . GERD (gastroesophageal reflux disease)   . Headache    HX OCCULAR MIGRAINES  . Hypertension    ESSENTIAL  . Myocardial infarction (HCC)   . Shortness of breath dyspnea    WITH EXERTION   . Tricuspid insufficiency    MODERATE     SOCIAL HX:  Social History   Tobacco Use  . Smoking status: Former Smoker    Types: Cigarettes  . Smokeless tobacco: Never Used  . Tobacco comment: formerly tried  smoking cigarettes, no regular use  Substance Use Topics  . Alcohol use: No    ALLERGIES:  Allergies  Allergen Reactions  . Contrast Media  [Iodinated Diagnostic Agents] Rash    Blister rash   . Azelastine Swelling  . Flonase [Fluticasone Propionate] Swelling  . Prednisone Swelling  . Shellfish Allergy Swelling and Other (See Comments)    Per spouse, determined on allergy test.  . Tramadol Nausea Only     PERTINENT MEDICATIONS:  Outpatient Encounter Medications as of 04/25/2020  Medication Sig  . acetaminophen (TYLENOL) 500 MG tablet Take 500 mg by mouth every 6 (six) hours as needed.   Marland Kitchen albumin human 25 % bottle Inject 50 grams no matter what  . alendronate (FOSAMAX) 70 MG tablet Take 70 mg by mouth once a week.  Marland Kitchen aMILoride (MIDAMOR) 5 MG tablet Take by mouth.  Marland Kitchen aspirin EC 81 MG tablet Take 81 mg by mouth daily.   . furosemide (LASIX) 20 MG tablet Take 20 mg by mouth daily.  Marland Kitchen lactulose (CHRONULAC) 10 GM/15ML solution Take 20 g by mouth daily.   Marland Kitchen levofloxacin (LEVAQUIN) 250 MG tablet Take by mouth.  . mirtazapine (REMERON) 15 MG tablet Take 1 tablet (15 mg total) by mouth at bedtime.  . nitroGLYCERIN (NITROSTAT) 0.4 MG SL  tablet Place 0.4 mg under the tongue every 5 (five) minutes as needed for chest pain.   Marland Kitchen omeprazole (PRILOSEC) 20 MG capsule Take 20 mg by mouth daily.   . ondansetron (ZOFRAN) 4 MG tablet Take 4 mg by mouth every 8 (eight) hours as needed.  . potassium chloride 20 MEQ/15ML (10%) SOLN Take by mouth.  . propranolol (INDERAL) 10 MG tablet Take 10 mg by mouth 2 (two) times daily.  . rifaximin (XIFAXAN) 550 MG TABS tablet Take by mouth.  . sertraline (ZOLOFT) 25 MG tablet Take by mouth.  . Zinc Sulfate (ZINC 15) 66 MG TABS Take 1 tablet (66 mg total) by mouth daily. Unsure of dose   No facility-administered encounter medications on file as of 04/25/2020.     Megean Fabio Marlena Clipper, NP

## 2020-04-25 NOTE — Telephone Encounter (Signed)
Patient states the medical does not draw out of a port and he is not taking her there. He states they used a child iv blood draw. He states he would rather take her to [facility] where she can get blood draw out of her port. He states he wants to know what you recommend. He states that he gave her propranolol this morning because her blood pressure was 149/80 and heart rate was 112. He states her heart rate can not be that high and he is not risking  her to have a heart attack or stroke.  He states there has to be a blood pressure mediation that will not affect her kidneys. He states she was peeing yesterday every hour to two hours. He states she also having small bowel movements multiply times a day. He has stopped taking Amiloride, Furosemide, lactulose, Levofloxacin and propranolol. But started taking propranolol again last night. Palliative care comes to the house today at 28.

## 2020-04-25 NOTE — Telephone Encounter (Signed)
She definitely needs labs because her creatinine is worsening.  Yes, he can bring her to [facility] if that is what he prefers Her labs will help [place] know if she needs admission  RV

## 2020-04-25 NOTE — Telephone Encounter (Signed)
Released labs per Dr. Marius Ditch request. Informed patient by Deloris Ping

## 2020-04-26 ENCOUNTER — Emergency Department: Payer: Medicare Other

## 2020-04-26 ENCOUNTER — Other Ambulatory Visit: Payer: Self-pay

## 2020-04-26 ENCOUNTER — Inpatient Hospital Stay
Admission: EM | Admit: 2020-04-26 | Discharge: 2020-05-02 | DRG: 432 | Disposition: A | Discharge status: Hospice | Payer: Medicare Other | Source: Ambulatory Visit | Attending: Internal Medicine | Admitting: Internal Medicine

## 2020-04-26 DIAGNOSIS — D72829 Elevated white blood cell count, unspecified: Secondary | ICD-10-CM | POA: Diagnosis not present

## 2020-04-26 DIAGNOSIS — J189 Pneumonia, unspecified organism: Secondary | ICD-10-CM

## 2020-04-26 DIAGNOSIS — Y92239 Unspecified place in hospital as the place of occurrence of the external cause: Secondary | ICD-10-CM | POA: Diagnosis not present

## 2020-04-26 DIAGNOSIS — K7469 Other cirrhosis of liver: Secondary | ICD-10-CM | POA: Diagnosis present

## 2020-04-26 DIAGNOSIS — I2510 Atherosclerotic heart disease of native coronary artery without angina pectoris: Secondary | ICD-10-CM | POA: Diagnosis present

## 2020-04-26 DIAGNOSIS — Z7189 Other specified counseling: Secondary | ICD-10-CM

## 2020-04-26 DIAGNOSIS — N2581 Secondary hyperparathyroidism of renal origin: Secondary | ICD-10-CM | POA: Diagnosis present

## 2020-04-26 DIAGNOSIS — R0600 Dyspnea, unspecified: Secondary | ICD-10-CM | POA: Diagnosis not present

## 2020-04-26 DIAGNOSIS — Z7983 Long term (current) use of bisphosphonates: Secondary | ICD-10-CM

## 2020-04-26 DIAGNOSIS — R251 Tremor, unspecified: Secondary | ICD-10-CM | POA: Diagnosis present

## 2020-04-26 DIAGNOSIS — Z825 Family history of asthma and other chronic lower respiratory diseases: Secondary | ICD-10-CM

## 2020-04-26 DIAGNOSIS — Z20822 Contact with and (suspected) exposure to covid-19: Secondary | ICD-10-CM | POA: Diagnosis present

## 2020-04-26 DIAGNOSIS — Z66 Do not resuscitate: Secondary | ICD-10-CM | POA: Diagnosis not present

## 2020-04-26 DIAGNOSIS — Z888 Allergy status to other drugs, medicaments and biological substances status: Secondary | ICD-10-CM | POA: Diagnosis not present

## 2020-04-26 DIAGNOSIS — R188 Other ascites: Secondary | ICD-10-CM | POA: Diagnosis present

## 2020-04-26 DIAGNOSIS — I129 Hypertensive chronic kidney disease with stage 1 through stage 4 chronic kidney disease, or unspecified chronic kidney disease: Secondary | ICD-10-CM | POA: Diagnosis present

## 2020-04-26 DIAGNOSIS — T50995A Adverse effect of other drugs, medicaments and biological substances, initial encounter: Secondary | ICD-10-CM | POA: Diagnosis not present

## 2020-04-26 DIAGNOSIS — Z6827 Body mass index (BMI) 27.0-27.9, adult: Secondary | ICD-10-CM

## 2020-04-26 DIAGNOSIS — R7989 Other specified abnormal findings of blood chemistry: Secondary | ICD-10-CM | POA: Diagnosis not present

## 2020-04-26 DIAGNOSIS — Z515 Encounter for palliative care: Secondary | ICD-10-CM | POA: Diagnosis not present

## 2020-04-26 DIAGNOSIS — N170 Acute kidney failure with tubular necrosis: Secondary | ICD-10-CM | POA: Diagnosis not present

## 2020-04-26 DIAGNOSIS — K767 Hepatorenal syndrome: Secondary | ICD-10-CM | POA: Diagnosis present

## 2020-04-26 DIAGNOSIS — K709 Alcoholic liver disease, unspecified: Secondary | ICD-10-CM | POA: Diagnosis not present

## 2020-04-26 DIAGNOSIS — D631 Anemia in chronic kidney disease: Secondary | ICD-10-CM | POA: Diagnosis present

## 2020-04-26 DIAGNOSIS — D696 Thrombocytopenia, unspecified: Secondary | ICD-10-CM | POA: Diagnosis not present

## 2020-04-26 DIAGNOSIS — E871 Hypo-osmolality and hyponatremia: Secondary | ICD-10-CM

## 2020-04-26 DIAGNOSIS — Z800 Family history of malignant neoplasm of digestive organs: Secondary | ICD-10-CM

## 2020-04-26 DIAGNOSIS — K7290 Hepatic failure, unspecified without coma: Secondary | ICD-10-CM

## 2020-04-26 DIAGNOSIS — Z91041 Radiographic dye allergy status: Secondary | ICD-10-CM

## 2020-04-26 DIAGNOSIS — Z91013 Allergy to seafood: Secondary | ICD-10-CM

## 2020-04-26 DIAGNOSIS — K7581 Nonalcoholic steatohepatitis (NASH): Secondary | ICD-10-CM | POA: Diagnosis present

## 2020-04-26 DIAGNOSIS — I252 Old myocardial infarction: Secondary | ICD-10-CM

## 2020-04-26 DIAGNOSIS — Z885 Allergy status to narcotic agent status: Secondary | ICD-10-CM

## 2020-04-26 DIAGNOSIS — R17 Unspecified jaundice: Secondary | ICD-10-CM

## 2020-04-26 DIAGNOSIS — E8889 Other specified metabolic disorders: Secondary | ICD-10-CM | POA: Diagnosis present

## 2020-04-26 DIAGNOSIS — N1831 Chronic kidney disease, stage 3a: Secondary | ICD-10-CM | POA: Diagnosis not present

## 2020-04-26 DIAGNOSIS — Z807 Family history of other malignant neoplasms of lymphoid, hematopoietic and related tissues: Secondary | ICD-10-CM

## 2020-04-26 DIAGNOSIS — F32A Depression, unspecified: Secondary | ICD-10-CM | POA: Diagnosis present

## 2020-04-26 DIAGNOSIS — J90 Pleural effusion, not elsewhere classified: Secondary | ICD-10-CM

## 2020-04-26 DIAGNOSIS — K7460 Unspecified cirrhosis of liver: Secondary | ICD-10-CM

## 2020-04-26 DIAGNOSIS — R627 Adult failure to thrive: Secondary | ICD-10-CM | POA: Diagnosis present

## 2020-04-26 DIAGNOSIS — D6959 Other secondary thrombocytopenia: Secondary | ICD-10-CM | POA: Diagnosis present

## 2020-04-26 DIAGNOSIS — Z981 Arthrodesis status: Secondary | ICD-10-CM

## 2020-04-26 DIAGNOSIS — F419 Anxiety disorder, unspecified: Secondary | ICD-10-CM | POA: Diagnosis present

## 2020-04-26 DIAGNOSIS — R0902 Hypoxemia: Secondary | ICD-10-CM

## 2020-04-26 DIAGNOSIS — J9811 Atelectasis: Secondary | ICD-10-CM | POA: Diagnosis present

## 2020-04-26 DIAGNOSIS — Z90710 Acquired absence of both cervix and uterus: Secondary | ICD-10-CM

## 2020-04-26 DIAGNOSIS — Z955 Presence of coronary angioplasty implant and graft: Secondary | ICD-10-CM

## 2020-04-26 DIAGNOSIS — N184 Chronic kidney disease, stage 4 (severe): Secondary | ICD-10-CM | POA: Diagnosis present

## 2020-04-26 DIAGNOSIS — N179 Acute kidney failure, unspecified: Secondary | ICD-10-CM | POA: Diagnosis present

## 2020-04-26 DIAGNOSIS — Z79899 Other long term (current) drug therapy: Secondary | ICD-10-CM

## 2020-04-26 DIAGNOSIS — Z7982 Long term (current) use of aspirin: Secondary | ICD-10-CM

## 2020-04-26 DIAGNOSIS — G9341 Metabolic encephalopathy: Secondary | ICD-10-CM | POA: Insufficient documentation

## 2020-04-26 DIAGNOSIS — Z87891 Personal history of nicotine dependence: Secondary | ICD-10-CM

## 2020-04-26 DIAGNOSIS — E8809 Other disorders of plasma-protein metabolism, not elsewhere classified: Secondary | ICD-10-CM | POA: Diagnosis present

## 2020-04-26 DIAGNOSIS — R0603 Acute respiratory distress: Secondary | ICD-10-CM | POA: Diagnosis not present

## 2020-04-26 DIAGNOSIS — I10 Essential (primary) hypertension: Secondary | ICD-10-CM | POA: Diagnosis present

## 2020-04-26 DIAGNOSIS — I959 Hypotension, unspecified: Secondary | ICD-10-CM | POA: Diagnosis not present

## 2020-04-26 LAB — COMPREHENSIVE METABOLIC PANEL
ALT: 39 U/L (ref 0–44)
AST: 106 U/L — ABNORMAL HIGH (ref 15–41)
Albumin: 3.6 g/dL (ref 3.5–5.0)
Alkaline Phosphatase: 116 U/L (ref 38–126)
Anion gap: 12 (ref 5–15)
BUN: 157 mg/dL — ABNORMAL HIGH (ref 8–23)
CO2: 17 mmol/L — ABNORMAL LOW (ref 22–32)
Calcium: 8.8 mg/dL — ABNORMAL LOW (ref 8.9–10.3)
Chloride: 93 mmol/L — ABNORMAL LOW (ref 98–111)
Creatinine, Ser: 2.12 mg/dL — ABNORMAL HIGH (ref 0.44–1.00)
GFR, Estimated: 24 mL/min — ABNORMAL LOW (ref >60)
Glucose, Bld: 130 mg/dL — ABNORMAL HIGH (ref 70–99)
Potassium: 4 mmol/L (ref 3.5–5.1)
Sodium: 122 mmol/L — ABNORMAL LOW (ref 135–145)
Total Bilirubin: 8 mg/dL — ABNORMAL HIGH (ref 0.3–1.2)
Total Protein: 7.2 g/dL (ref 6.5–8.1)

## 2020-04-26 LAB — URINALYSIS, ROUTINE W REFLEX MICROSCOPIC
Bilirubin Urine: NEGATIVE
Glucose, UA: NEGATIVE mg/dL
Hgb urine dipstick: NEGATIVE
Ketones, ur: NEGATIVE mg/dL
Nitrite: NEGATIVE
Protein, ur: NEGATIVE mg/dL
Specific Gravity, Urine: 1.012 (ref 1.005–1.030)
pH: 5 (ref 5.0–8.0)

## 2020-04-26 LAB — CBC
HCT: 22.8 % — ABNORMAL LOW (ref 36.0–46.0)
Hemoglobin: 8 g/dL — ABNORMAL LOW (ref 12.0–15.0)
MCH: 37 pg — ABNORMAL HIGH (ref 26.0–34.0)
MCHC: 35.1 g/dL (ref 30.0–36.0)
MCV: 105.6 fL — ABNORMAL HIGH (ref 80.0–100.0)
Platelets: 45 10*3/uL — ABNORMAL LOW (ref 150–400)
RBC: 2.16 MIL/uL — ABNORMAL LOW (ref 3.87–5.11)
RDW: 17 % — ABNORMAL HIGH (ref 11.5–15.5)
WBC: 16.4 10*3/uL — ABNORMAL HIGH (ref 4.0–10.5)
nRBC: 0 % (ref 0.0–0.2)

## 2020-04-26 LAB — RESP PANEL BY RT-PCR (FLU A&B, COVID) ARPGX2
Influenza A by PCR: NEGATIVE
Influenza B by PCR: NEGATIVE
SARS Coronavirus 2 by RT PCR: NEGATIVE

## 2020-04-26 LAB — AMMONIA: Ammonia: 44 umol/L — ABNORMAL HIGH (ref 9–35)

## 2020-04-26 LAB — LACTIC ACID, PLASMA: Lactic Acid, Venous: 1.7 mmol/L (ref 0.5–1.9)

## 2020-04-26 MED ORDER — SODIUM CHLORIDE 0.9 % IV SOLN
2.0000 g | INTRAVENOUS | Status: AC
  Administered 2020-04-27 – 2020-04-30 (×4): 2 g via INTRAVENOUS
  Filled 2020-04-26 (×3): qty 20
  Filled 2020-04-26: qty 2

## 2020-04-26 MED ORDER — SODIUM CHLORIDE 0.9 % IV BOLUS
500.0000 mL | Freq: Once | INTRAVENOUS | Status: AC
  Administered 2020-04-26: 500 mL via INTRAVENOUS

## 2020-04-26 MED ORDER — SODIUM CHLORIDE 0.9 % IV SOLN
500.0000 mg | INTRAVENOUS | Status: DC
  Administered 2020-04-26: 500 mg via INTRAVENOUS
  Filled 2020-04-26: qty 500

## 2020-04-26 MED ORDER — AZITHROMYCIN 500 MG IV SOLR
500.0000 mg | INTRAVENOUS | Status: DC
  Filled 2020-04-26: qty 500

## 2020-04-26 MED ORDER — SODIUM CHLORIDE 0.9 % IV SOLN
2.0000 g | INTRAVENOUS | Status: DC
  Administered 2020-04-26: 2 g via INTRAVENOUS
  Filled 2020-04-26: qty 20

## 2020-04-26 NOTE — ED Notes (Signed)
Informed IT trainer of bed assignment

## 2020-04-26 NOTE — ED Provider Notes (Signed)
[facility] Emergency Department Provider Note ____________________________________________   Event Date/Time   First MD Initiated Contact with Patient 04/26/20 2016     (approximate)  I have reviewed the triage vital signs and the nursing notes.   HISTORY  Chief Complaint Altered Mental Status  Level 5 caveat: History of present illness limited due to altered mental status  HPI Shannon Sawyer is a 75 y.o. female with PMH as noted below including liver cirrhosis who presents with increased generalized weakness, shortness of breath, and confusion.  She was referred to the ED for admission by her gastroenterologist Dr. Marius Ditch due to recent lab abnormalities.  The husband reports that the patient was recently instructed to stop most of her medications due to the possibility of kidney injury and rising creatinine.  She has been off of her medication since last week.  Past Medical History:  Diagnosis Date  . Allergy   . Anemia    LOW PLATELET COUNTS  ([facility])  . Anxiety   . Carotid artery disease (HCC)    BILAT.        Marland Kitchen Chronic cough   . Cirrhosis, nonalcoholic (HCC)    PORTAL HTN, FATTY TISSUE   . Coronary artery disease   . DDD (degenerative disc disease), cervical    SPONDYLOLISTHESIS  . Depression   . GERD (gastroesophageal reflux disease)   . Headache    HX OCCULAR MIGRAINES  . Hypertension    ESSENTIAL  . Myocardial infarction ([place])   . Shortness of breath dyspnea    WITH EXERTION   . Tricuspid insufficiency    MODERATE     Patient Active Problem List   Diagnosis Date Noted  . Acute metabolic encephalopathy 37/16/9678  . CAP (community acquired pneumonia) 04/26/2020  . Vaginal yeast infection 07/29/2019  . Toenail fungus 07/29/2019  . SBP (spontaneous bacterial peritonitis) ([place]) 07/29/2019  . Poor dentition 07/28/2019  . Dysphagia 07/28/2019  . Post-menopausal bleeding 07/28/2019  . Decompensated hepatic  cirrhosis, non-alcoholic ([place]) 93/81/0175  . Hypokalemia 06/23/2019  . Stage 3b chronic kidney disease ([place]) 06/23/2019  . Leukocytosis 06/17/2019  . HLD (hyperlipidemia) 05/29/2019  . Anxiety 05/29/2019  . CAD (coronary artery disease) 05/29/2019  . Acute renal failure superimposed on stage IIIb chronic kidney disease ([place]) 05/29/2019  . Generalized weakness 05/29/2019  . Acute on chronic hyponatremia 05/29/2019  . Seasonal allergic rhinitis due to pollen 05/07/2018  . Thrombocytopenia ([place]) 02/03/2018  . H/O acute myocardial infarction 07/11/2017  . GERD (gastroesophageal reflux disease) 07/11/2017  . Bradycardia 11/06/2016  . Severe anxiety with panic 11/15/2015  . Memory change 06/30/2015  . H/O acute pancreatitis 11/18/2014  . Cervical radiculopathy 06/03/2014  . TI (tricuspid incompetence) 02/12/2014  . Carotid artery narrowing 01/12/2014  . Non-alcoholic cirrhosis ([place]) 12/27/8525  . Anemia, iron deficiency 01/12/2014  . Breathlessness on exertion 01/12/2014  . Bilateral carotid artery stenosis 01/12/2014  . Cervical radiculitis 11/04/2013  . DDD (degenerative disc disease), cervical 11/04/2013  . SPL (spondylolisthesis) 11/04/2013  . Arteriosclerosis of coronary artery 05/20/2013  . Essential (primary) hypertension 05/20/2013  . Combined fat and carbohydrate induced hyperlipemia 05/20/2013  . NSTEMI (non-ST elevated myocardial infarction) ([place]) 12/04/2011  . Hyperglycemia 12/04/2011    Past Surgical History:  Procedure Laterality Date  . ABDOMINAL HYSTERECTOMY    . ANTERIOR CERVICAL DECOMP/DISCECTOMY FUSION N/A 06/03/2014   Procedure: CERVICAL FIVE-CERVICAL SIX, CERVICAL SIX-CERVICAL SEVEN ANTERIOR CERVICAL DECOMPRESSION/DISCECTOMY ;  Surgeon: Karie Chimera, MD;  Location: [facility]  NEURO ORS;  Service: Neurosurgery;  Laterality: N/A;  . BREAST BIOPSY Left 09/02/2015   benign  . BREAST CYST ASPIRATION Left    neg  . CARDIAC CATHETERIZATION     2 STENTS 2013 IN [place]   . cardiac stents    . ESOPHAGOGASTRODUODENOSCOPY (EGD) WITH PROPOFOL N/A 02/21/2015   Procedure: ESOPHAGOGASTRODUODENOSCOPY (EGD) WITH PROPOFOL;  Surgeon: Josefine Class, MD;  Location[facility] ENDOSCOPY;  Service: Endoscopy;  Laterality: N/A;  . EYE SURGERY      Prior to Admission medications   Medication Sig Start Date End Date Taking? Authorizing Provider  acetaminophen (TYLENOL) 500 MG tablet Take 500 mg by mouth every 6 (six) hours as needed.     [provider]  albumin human 25 % bottle Inject 50 grams no matter what 04/19/20   Lin Landsman, MD  alendronate (FOSAMAX) 70 MG tablet Take 70 mg by mouth once a week. 12/28/19   [provider]  aMILoride (MIDAMOR) 5 MG tablet Take by mouth. 07/06/19 07/05/20  [provider]  aspirin EC 81 MG tablet Take 81 mg by mouth daily.  12/08/11   [provider]  furosemide (LASIX) 20 MG tablet Take 20 mg by mouth daily. 05/16/19   [provider]  lactulose (CHRONULAC) 10 GM/15ML solution Take 20 g by mouth daily.     [provider]  levofloxacin (LEVAQUIN) 250 MG tablet Take by mouth. 06/28/19   [provider]  mirtazapine (REMERON) 15 MG tablet Take 1 tablet (15 mg total) by mouth at bedtime. 07/28/19   Virginia Crews, MD  nitroGLYCERIN (NITROSTAT) 0.4 MG SL tablet Place 0.4 mg under the tongue every 5 (five) minutes as needed for chest pain.     [provider]  omeprazole (PRILOSEC) 20 MG capsule Take 20 mg by mouth daily.     [provider]  ondansetron (ZOFRAN) 4 MG tablet Take 4 mg by mouth every 8 (eight) hours as needed. 06/28/19   [provider]  potassium chloride 20 MEQ/15ML (10%) SOLN Take by mouth. 07/07/19   [provider]  propranolol (INDERAL) 10 MG tablet Take 10 mg by mouth 2 (two) times daily. 10/23/19   [provider]  rifaximin (XIFAXAN) 550 MG TABS tablet Take by mouth. 11/06/19   [provider]   sertraline (ZOLOFT) 25 MG tablet Take by mouth. 03/10/20   [provider]  Zinc Sulfate (ZINC 15) 66 MG TABS Take 1 tablet (66 mg total) by mouth daily. Unsure of dose 06/01/19   Ralene Muskrat B, MD    Allergies Contrast media  [iodinated diagnostic agents], Azelastine, Flonase [fluticasone propionate], Prednisone, Shellfish allergy, and Tramadol  Family History  Problem Relation Age of Onset  . Liver cancer Mother   . Lymphoma Father   . COPD Father   . COPD Brother   . Liver cancer Son   . Breast cancer Neg Hx   . Colon cancer Neg Hx     Social History Social History   Tobacco Use  . Smoking status: Former Smoker    Types: Cigarettes  . Smokeless tobacco: Never Used  . Tobacco comment: formerly tried smoking cigarettes, no regular use  Vaping Use  . Vaping Use: Never used  Substance Use Topics  . Alcohol use: No  . Drug use: No    Review of Systems Level 5 caveat: Review of systems limited due to altered mental status Constitutional: Positive for weakness. Cardiovascular: Denies chest pain. Respiratory: Positive  for shortness of breath. Gastrointestinal: No abdominal pain. Skin: Negative for rash. Neurological: Negative for headache.   ____________________________________________   PHYSICAL EXAM:  VITAL SIGNS: ED Triage Vitals  Enc Vitals Group     BP 04/26/20 1546 (!) 113/48     Pulse Rate 04/26/20 1546 70     Resp 04/26/20 1546 16     Temp 04/26/20 1546 97.9 F (36.6 C)     Temp Source 04/26/20 1546 Oral     SpO2 04/26/20 1546 95 %     Weight 04/26/20 1547 134 lb (60.8 kg)     Height 04/26/20 1547 5' 1"  (1.549 m)     Head Circumference --      Peak Flow --      Pain Score 04/26/20 1547 0     Pain Loc --      Pain Edu? --      Excl. in [place]? --     Constitutional: Alert and oriented x4.  Somewhat weak appearing, slow to respond, but in no acute distress. Eyes: Conjunctivae are normal.  Faint scleral icterus Head: Atraumatic. Nose:  No congestion/rhinnorhea. Mouth/Throat: Mucous membranes are somewhat dry.   Neck: Normal range of motion.  Cardiovascular: Normal rate, regular rhythm.   Good peripheral circulation. Respiratory: Normal respiratory effort.  No retractions.  Gastrointestinal: Soft and nontender.  Mild distention Genitourinary: No flank tenderness. Musculoskeletal: No lower extremity edema.  Extremities warm and well perfused.  Neurologic:  Normal speech and language.  Motor intact in all extremities. Skin:  Skin is warm and dry. No rash noted. Psychiatric: Calm and cooperative.  ____________________________________________   LABS (all labs ordered are listed, but only abnormal results are displayed)  Labs Reviewed  COMPREHENSIVE METABOLIC PANEL - Abnormal; Notable for the following components:      Result Value   Sodium 122 (*)    Chloride 93 (*)    CO2 17 (*)    Glucose, Bld 130 (*)    BUN 157 (*)    Creatinine, Ser 2.12 (*)    Calcium 8.8 (*)    AST 106 (*)    Total Bilirubin 8.0 (*)    GFR, Estimated 24 (*)    All other components within normal limits  CBC - Abnormal; Notable for the following components:   WBC 16.4 (*)    RBC 2.16 (*)    Hemoglobin 8.0 (*)    HCT 22.8 (*)    MCV 105.6 (*)    MCH 37.0 (*)    RDW 17.0 (*)    Platelets 45 (*)    All other components within normal limits  URINALYSIS, ROUTINE W REFLEX MICROSCOPIC - Abnormal; Notable for the following components:   Color, Urine YELLOW (*)    APPearance HAZY (*)    Leukocytes,Ua MODERATE (*)    Bacteria, UA RARE (*)    All other components within normal limits  AMMONIA - Abnormal; Notable for the following components:   Ammonia 44 (*)    All other components within normal limits  RESP PANEL BY RT-PCR (FLU A&B, COVID) ARPGX2  LACTIC ACID, PLASMA   ____________________________________________  EKG   ____________________________________________  RADIOLOGY  Chest x-ray interpreted by me shows left pleural  effusion with adjacent infiltrate  ____________________________________________   PROCEDURES  Procedure(s) performed: No  Procedures  Critical Care performed: No ____________________________________________   INITIAL IMPRESSION / ASSESSMENT AND PLAN / ED COURSE  Pertinent labs & imaging results that were available during my care of the patient were  reviewed by me and considered in my medical decision making (see chart for details).  75 year old female with a history of cirrhosis and other PMH as noted above presents with increased generalized weakness, shortness of breath, and confusion.  She was referred to the ED for admission by her gastroenterologist Dr. Marius Ditch.  I reviewed the past medical records in epic.  There is a note from Dr. Marius Ditch from yesterday which states as follows:  Reviewed abnormal labs with patient's husband regarding worsening hyperbilirubinemia, renal function.  She also has hyponatremia.  New onset of leukocytosis and anemia  Concern for infection, could be UTI I stopped her Levaquin which is for secondary SBP prophylaxis, propranolol due to worsening of kidney function Attempted paracentesis, there was minimal fluid  Recommend hospital admission.  Advised patient's husband that he should bring her to ER and he preferred [facility]  On exam, the patient is somewhat weak appearing.  She is alert and oriented but slightly slow to respond to questions.  Her vital signs are normal.  There is faint jaundice.  The abdomen is distended but soft and nontender.  Exam is otherwise as described above.  Initial lab work-up confirms hyponatremia, elevated creatinine, elevated bilirubin, and leukocytosis.  Urinalysis shows a few RBCs and rare bacteria but no clear evidence of infection.  Differential includes worsening cirrhosis, acute liver failure, acute infection/sepsis (although I do not suspect UTI) including pneumonia or COVID-19, hepatic encephalopathy.  In addition to  the initial labs I have added on a chest x-ray, Covid swab, lactate, and pneumonia.  Plan will be for admission.  ----------------------------------------- 11:25 PM on 04/26/2020 -----------------------------------------  Chest x-ray shows left pleural effusion with an adjacent infiltrate or atelectasis.  Given the elevated WBC count and worsening shortness of breath I will treat empirically for pneumonia.  I consulted Dr. Damita Dunnings from the hospitalist service for admission.  __________________________  Taijah B Sortino was evaluated in Emergency Department on 04/26/2020 for the symptoms described in the history of present illness. She was evaluated in the context of the global COVID-19 pandemic, which necessitated consideration that the patient might be at risk for infection with the SARS-CoV-2 virus that causes COVID-19. Institutional protocols and algorithms that pertain to the evaluation of patients at risk for COVID-19 are in a state of rapid change based on information released by regulatory bodies including the CDC and federal and state organizations. These policies and algorithms were followed during the patient's care in the ED.  ____________________________________________   FINAL CLINICAL IMPRESSION(S) / ED DIAGNOSES  Final diagnoses:  Hyponatremia  Pleural effusion  Elevated bilirubin      NEW MEDICATIONS STARTED DURING THIS VISIT:  New Prescriptions   No medications on file     Note:  This document was prepared using Dragon voice recognition software and may include unintentional dictation errors.   Arta Silence, MD 04/26/20 2325

## 2020-04-26 NOTE — H&P (Addendum)
History and Physical    Shannon Sawyer BEM:[id number] DOB: 1945/09/01 DOA: 04/26/2020  PCP: Gladstone Lighter, MD   Patient coming from: home  I have personally briefly reviewed patient's old medical records in [place]  Chief Complaint: Altered mental status, sent in by GI  HPI: Shannon Sawyer is a 75 y.o. female with medical history significant for CAD, HTN, depression, CKD IIIb, nonalcoholic liver cirrhosis, not a liver transplant candidate, who was recently referred to palliative care who was sent in by GI for evaluation of altered mental status and concern for labs.  Patient was seen by her gastroenterologist, Dr. Marius Ditch who had noticed worsening renal function and medications for her liver cirrhosis including Levaquin were held however renal function continued to decline.  Additionally she had worsening hyperbilirubinemia, hyponatremia and new leukocytosis and anemia.  Paracentesis was attempted in the office on 2/21 to evaluate for SBP given discontinuation in her Levaquin prophylaxis however there was minimal fluid and patient was subsequently referred for admission.  Husband reports increased weakness, shortness of breath and confusion over the past several days.  There have been no nausea or vomiting or cough or fever or chills.  No complaints of abdominal pain, no black stool or blood in the stool. ED Course: Upon arrival, afebrile at 97.9, BP 113/48, pulse 70 respirations 16 with O2 sat 95% on room air.  Multiple lab abnormalities.  Leukocytosis of 16,000, hemoglobin 8, down from 8.9 the day prior.  Platelets 45K which is baseline, sodium 122 down from 126 the day prior, creatinine 2.12 up from 1.94, bilirubin of 8.0, AST 106 normal ALT and alk phos.  Lactic acid 1.7.  Ammonia 44.  Urinalysis without pyuria. EKG as reviewed by me : Sinus at 69 without acute ST-T wave changes Imaging: Chest x-ray:Left-sided pleural effusion and associated atelectasis/infiltrate.  Patient was  started on Rocephin and azithromycin for possible pneumonia.  Hospitalist consulted.  Review of Systems: As per HPI otherwise all other systems on review of systems negative.    Past Medical History:  Diagnosis Date  . Allergy   . Anemia    LOW PLATELET COUNTS  ([facility])  . Anxiety   . Carotid artery disease (HCC)    BILAT.        Marland Kitchen Chronic cough   . Cirrhosis, nonalcoholic (HCC)    PORTAL HTN, FATTY TISSUE   . Coronary artery disease   . DDD (degenerative disc disease), cervical    SPONDYLOLISTHESIS  . Depression   . GERD (gastroesophageal reflux disease)   . Headache    HX OCCULAR MIGRAINES  . Hypertension    ESSENTIAL  . Myocardial infarction ([place])   . Shortness of breath dyspnea    WITH EXERTION   . Tricuspid insufficiency    MODERATE     Past Surgical History:  Procedure Laterality Date  . ABDOMINAL HYSTERECTOMY    . ANTERIOR CERVICAL DECOMP/DISCECTOMY FUSION N/A 06/03/2014   Procedure: CERVICAL FIVE-CERVICAL SIX, CERVICAL SIX-CERVICAL SEVEN ANTERIOR CERVICAL DECOMPRESSION/DISCECTOMY ;  Surgeon: Karie Chimera, MD;  Location: [facility];  Service: Neurosurgery;  Laterality: N/A;  . BREAST BIOPSY Left 09/02/2015   benign  . BREAST CYST ASPIRATION Left    neg  . CARDIAC CATHETERIZATION     2 STENTS 2013 IN [place]  . cardiac stents    . ESOPHAGOGASTRODUODENOSCOPY (EGD) WITH PROPOFOL N/A 02/21/2015   Procedure: ESOPHAGOGASTRODUODENOSCOPY (EGD) WITH PROPOFOL;  Surgeon: Josefine Class, MD;  Location: [facility] END[facility]COPY;  Service: Endoscopy;  Laterality: N/A;  . EYE SURGERY       reports that she has quit smoking. Her smoking use included cigarettes. She has never used smokeless tobacco. She reports that she does not drink alcohol and does not use drugs.  Allergies  Allergen Reactions  . Contrast Media  [Iodinated Diagnostic Agents] Rash    Blister rash   . Azelastine Swelling  . Flonase [Fluticasone Propionate] Swelling  . Prednisone  Swelling  . Shellfish Allergy Swelling and Other (See Comments)    Per spouse, determined on allergy test.  . Tramadol Nausea Only    Family History  Problem Relation Age of Onset  . Liver cancer Mother   . Lymphoma Father   . COPD Father   . COPD Brother   . Liver cancer Son   . Breast cancer Neg Hx   . Colon cancer Neg Hx       Prior to Admission medications   Medication Sig Start Date End Date Taking? Authorizing Provider  acetaminophen (TYLENOL) 500 MG tablet Take 500 mg by mouth every 6 (six) hours as needed.     [provider]  albumin human 25 % bottle Inject 50 grams no matter what 04/19/20   Lin Landsman, MD  alendronate (FOSAMAX) 70 MG tablet Take 70 mg by mouth once a week. 12/28/19   [provider]  aMILoride (MIDAMOR) 5 MG tablet Take by mouth. 07/06/19 07/05/20  [provider]  aspirin EC 81 MG tablet Take 81 mg by mouth daily.  12/08/11   [provider]  furosemide (LASIX) 20 MG tablet Take 20 mg by mouth daily. 05/16/19   [provider]  lactulose (CHRONULAC) 10 GM/15ML solution Take 20 g by mouth daily.     [provider]  levofloxacin (LEVAQUIN) 250 MG tablet Take by mouth. 06/28/19   [provider]  mirtazapine (REMERON) 15 MG tablet Take 1 tablet (15 mg total) by mouth at bedtime. 07/28/19   Virginia Crews, MD  nitroGLYCERIN (NITROSTAT) 0.4 MG SL tablet Place 0.4 mg under the tongue every 5 (five) minutes as needed for chest pain.     [provider]  omeprazole (PRILOSEC) 20 MG capsule Take 20 mg by mouth daily.     [provider]  ondansetron (ZOFRAN) 4 MG tablet Take 4 mg by mouth every 8 (eight) hours as needed. 06/28/19   [provider]  potassium chloride 20 MEQ/15ML (10%) SOLN Take by mouth. 07/07/19   [provider]  propranolol (INDERAL) 10 MG tablet Take 10 mg by mouth 2 (two) times daily. 10/23/19   [provider]  rifaximin (XIFAXAN)  550 MG TABS tablet Take by mouth. 11/06/19   [provider]  sertraline (ZOLOFT) 25 MG tablet Take by mouth. 03/10/20   [provider]  Zinc Sulfate (ZINC 15) 66 MG TABS Take 1 tablet (66 mg total) by mouth daily. Unsure of dose 06/01/19   Sidney Ace, MD    Physical Exam: Vitals:   04/26/20 2130 04/26/20 2200 04/26/20 2230 04/26/20 2300  BP: (!) 114/44 (!) 120/53 (!) 113/47 (!) 112/54  Pulse: 66 67 69 67  Resp: 19 18 19 17   Temp:      TempSrc:      SpO2: 94% 94% 94% 93%  Weight:      Height:         Vitals:   04/26/20 2130 04/26/20 2200 04/26/20 2230 04/26/20 2300  BP: Marland Kitchen)  114/44 (!) 120/53 (!) 113/47 (!) 112/54  Pulse: 66 67 69 67  Resp: 19 18 19 17   Temp:      TempSrc:      SpO2: 94% 94% 94% 93%  Weight:      Height:          Constitutional:  Chronically ill-appearing, icteric oriented x 3 . Not in any apparent distress HEENT:      Head: Normocephalic and atraumatic.         Eyes: PERLA, EOMI, Conjunctivae are normal. Sclera is icteric.       Mouth/Throat: Mucous membranes are moist.       Neck: Supple with no signs of meningismus. Cardiovascular: Regular rate and rhythm. No murmurs, gallops, or rubs. 2+ symmetrical distal pulses are present . No JVD. No 2+LE edema Respiratory: Respiratory effort increased with scattered wheezes  Gastrointestinal: Soft, non tender, mildly distended with positive bowel sounds.  Genitourinary: No CVA tenderness. Musculoskeletal: Nontender with normal range of motion in all extremities. No cyanosis, or erythema of extremities. Neurologic:  Face is symmetric. Moving all extremities. No gross focal neurologic deficits . Skin: Skin is warm, dry.  No rash or ulcers Psychiatric: Mood and affect are normal    Labs on Admission: I have personally reviewed following labs and imaging studies  CBC: Recent Labs  Lab 04/25/20 1401 04/26/20 1601  WBC 14.8* 16.4*  HGB 8.9* 8.0*  HCT 24.3* 22.8*  MCV 104.3* 105.6*   PLT 45* 45*   Basic Metabolic Panel: Recent Labs  Lab 04/25/20 1401 04/26/20 1601  NA 126* 122*  K 4.0 4.0  CL 97* 93*  CO2 16* 17*  GLUCOSE 130* 130*  BUN 152* 157*  CREATININE 1.94* 2.12*  CALCIUM 9.1 8.8*  MG 2.5*  --   PHOS 4.9*  --    GFR: Estimated Creatinine Clearance: 19.5 mL/min (A) (by C-G formula based on SCr of 2.12 mg/dL (H)). Liver Function Tests: Recent Labs  Lab 04/25/20 1401 04/26/20 1601  AST 110* 106*  ALT 41 39  ALKPHOS 110 116  BILITOT 7.0* 8.0*  PROT 7.3 7.2  ALBUMIN 3.7 3.6   No results for input(s): LIPASE, AMYLASE in the last 168 hours. Recent Labs  Lab 04/26/20 2054  AMMONIA 44*   Coagulation Profile: No results for input(s): INR, PROTIME in the last 168 hours. Cardiac Enzymes: No results for input(s): CKTOTAL, CKMB, CKMBINDEX, TROPONINI in the last 168 hours. BNP (last 3 results) No results for input(s): PROBNP in the last 8760 hours. HbA1C: No results for input(s): HGBA1C in the last 72 hours. CBG: No results for input(s): GLUCAP in the last 168 hours. Lipid Profile: No results for input(s): CHOL, HDL, LDLCALC, TRIG, CHOLHDL, LDLDIRECT in the last 72 hours. Thyroid Function Tests: No results for input(s): TSH, T4TOTAL, FREET4, T3FREE, THYROIDAB in the last 72 hours. Anemia Panel: No results for input(s): VITAMINB12, FOLATE, FERRITIN, TIBC, IRON, RETICCTPCT in the last 72 hours. Urine analysis:    Component Value Date/Time   COLORURINE YELLOW (A) 04/26/2020 1601   APPEARANCEUR HAZY (A) 04/26/2020 1601   LABSPEC 1.012 04/26/2020 1601   PHURINE 5.0 04/26/2020 1601   GLUCOSEU NEGATIVE 04/26/2020 1601   HGBUR NEGATIVE 04/26/2020 1601   BILIRUBINUR NEGATIVE 04/26/2020 1601   KETONESUR NEGATIVE 04/26/2020 1601   PROTEINUR NEGATIVE 04/26/2020 1601   NITRITE NEGATIVE 04/26/2020 1601   LEUKOCYTESUR MODERATE (A) 04/26/2020 1601    Radiological Exams on Admission: DG Chest Portable 1 View  Result Date: 04/26/2020 CLINICAL  DATA:  75 year old female with shortness of breath. EXAM: PORTABLE CHEST 1 VIEW COMPARISON:  Chest radiograph dated 05/29/2019. FINDINGS: Right-sided Port-A-Cath with tip at the cavoatrial junction. Left lung base opacity, likely combination of pleural effusion and associated atelectasis or infiltrate. There is diffuse vascular and interstitial prominence consistent with congestion and/or edema. No pneumothorax. Atherosclerotic calcification of the aortic arch. No acute osseous pathology. Lower cervical ACDF. IMPRESSION: Left-sided pleural effusion and associated atelectasis/infiltrate. Electronically Signed   By: Anner Crete M.D.   On: 04/26/2020 21:24     Assessment/Plan 75 year old female with history of CAD, HTN, depression, CKD IIIb, nonalcoholic liver cirrhosis, not a liver transplant candidate, who was recently referred to palliative care who was sent in by GI for evaluation of altered mental status and concern for altered labs.    Decompensated hepatic cirrhosis, non-alcoholic (HCC)   Jaundice -Patient with confusion, ammonia 44, platelets 45,000, anemia, hyperbilirubinemia of 8, hyponatremia and ascites. No complaints of pain -Not a liver transplant candidate, and was recently referred to palliative care  -Recently had discontinuation of some meds due to worsening renal function including Levaquin -Unsuccessful paracentesis on 2/21 -Consider IR consult for paracentesis but will await GI recommendations -Continue Rocephin -Lactulose    Leukocytosis -Leukocytosis of 30,076, uncertain whether attributable to acute infection -Acuity is uncertain as chart review reveals leukocytosis for the past several months -Continue to monitor -Continue treatment of pneumonia seen on chest x-ray -We will await GI recommendations versus retry of paracentesis with IR assistance, given unsuccessfully attempt on 2/21 -Patient currently on Rocephin and azithromycin for treatment of CAP    Mild  confusion versus early hepatic encephalopathy -Patient with increased confusion and ammonia level of 44 but oriented x3 -Possibly multifactorial related to elevated ammonia level, possible acute infection, as well as severe hyponatremia -Treat infection, -Trial of lactulose -Neurologic checks -Fall and aspiration precautions    CAP (community acquired pneumonia) -Chest x-ray showing possible infiltrate and patient has leukocytosis without fever or tachycardia but symptomatic for shortness of breath and weakness -Empiric Rocephin and azithromycin while continuing with any other potential source of infection     Acute renal failure superimposed on stage IIIb CKD -Creatinine 2.12, up from 1.94 on the day prior -Could be related to poor oral intake, given lack of improvement with prior discontinuation of some of the medication that might affect renal function -We will try some gentle hydration with normal saline   Thrombocytopenia (HCC) -Stable platelet count of 45,000 -SCDs for DVT prophylaxis -Continue to monitor  Chronic anemia -Hemoglobin 8, down from 8.9 the day prior -No blood in stool or melena -Continue to monitor    Acute on chronic hyponatremia -Serum sodium 122, down from 1.6 yesterday prior but baseline usually around 126-131 -All likely related to liver cirrhosis -May be contributing in part to altered mental status -Follow urine and sodium osmolalities and urine sodium -Continue to monitor.  Not a candidate for hypertonic saline at this time  Essential (primary) hypertension -Blood pressure somewhat soft so hold antihypertensives    CAD (coronary artery disease) -No complaints of chest pain.  EKG nonacute -Nitroglycerin as needed, aspirin -Propranolol as blood pressure will tolerate     DVT prophylaxis: SCDs Code Status: full code  Family Communication:  none  Disposition Plan: Back to previous home environment Consults called: GI Status:At the time of  admission, it appears that the appropriate admission status for this patient is INPATIENT. This is judged to be reasonable and necessary in order to  provide the required intensity of service to ensure the patient's safety given the presenting symptoms, physical exam findings, and initial radiographic and laboratory data in the context of their  Comorbid conditions.   Patient requires inpatient status due to high intensity of service, high risk for further deterioration and high frequency of surveillance required.   I certify that at the point of admission it is my clinical judgment that the patient will require inpatient hospital care spanning beyond [place] MD [facility]     04/26/2020, 11:34 PM

## 2020-04-26 NOTE — ED Notes (Signed)
Tiffany RN to assist this RN in accessing pt's port soon.

## 2020-04-26 NOTE — ED Notes (Signed)
Pt assisted to BR and placed back in subwait with family. Call bell in reach

## 2020-04-26 NOTE — ED Notes (Signed)
Family updated at this time. Pt and family given some drink and food

## 2020-04-26 NOTE — ED Notes (Addendum)
See triage note. Jaundice noted in skin color and whites of eyes. Pt's abd distented but soft. Denies pain. Husband at bedside reports pt recently taken off all daily meds due to liver and kidney function. Pt/husband denies that pt has history of respiratory issues. Pt currently wheezing. Pt denies changes with her BM's and urination. Resp reg/unlabored. Pt denies feeling SOB currently (at rest) but states does get SOB when walking around. 96% RA. Husband denies pt having any recent fevers or being in contact with anyone sick. Reports pt usually on levofloxacin and lasix but was taken off these meds recently. Bruising noted all over pt's arms and directly over the port on her R chest. Pt given warm blanket. Bed locked low. Rail up. Call bell within reach.

## 2020-04-26 NOTE — Progress Notes (Signed)
Reviewed abnormal labs with patient's husband regarding worsening hyperbilirubinemia, renal function.  She also has hyponatremia.  New onset of leukocytosis and anemia  Concern for infection, could be UTI I stopped her Levaquin which is for secondary SBP prophylaxis, propranolol due to worsening of kidney function Attempted paracentesis, there was minimal fluid  Recommend hospital admission.  Advised patient's husband that he should bring her to ER and he preferred [place], MD [address]  [place], [place] [address]  Main: [phone]  Fax: [phone] Pager: [phone]

## 2020-04-26 NOTE — ED Triage Notes (Signed)
Pt sent from PCP for decline with liver and kidney issues, pt has cirrhosis and was seen last week for routine check up, should some issue with kidney function and when she came back yesterday there wasn't any significant change. Pt is altered and unable to give history.

## 2020-04-27 ENCOUNTER — Telehealth: Payer: Self-pay

## 2020-04-27 DIAGNOSIS — R17 Unspecified jaundice: Secondary | ICD-10-CM

## 2020-04-27 DIAGNOSIS — N1831 Chronic kidney disease, stage 3a: Secondary | ICD-10-CM

## 2020-04-27 DIAGNOSIS — E871 Hypo-osmolality and hyponatremia: Secondary | ICD-10-CM

## 2020-04-27 DIAGNOSIS — K7460 Unspecified cirrhosis of liver: Secondary | ICD-10-CM

## 2020-04-27 DIAGNOSIS — N179 Acute kidney failure, unspecified: Secondary | ICD-10-CM

## 2020-04-27 DIAGNOSIS — Z515 Encounter for palliative care: Secondary | ICD-10-CM | POA: Diagnosis not present

## 2020-04-27 DIAGNOSIS — Z7189 Other specified counseling: Secondary | ICD-10-CM | POA: Diagnosis not present

## 2020-04-27 DIAGNOSIS — N170 Acute kidney failure with tubular necrosis: Secondary | ICD-10-CM | POA: Diagnosis not present

## 2020-04-27 DIAGNOSIS — K7290 Hepatic failure, unspecified without coma: Secondary | ICD-10-CM

## 2020-04-27 LAB — CBC WITH DIFFERENTIAL/PLATELET
Abs Immature Granulocytes: 0.14 10*3/uL — ABNORMAL HIGH (ref 0.00–0.07)
Basophils Absolute: 0 10*3/uL (ref 0.0–0.1)
Basophils Relative: 0 %
Eosinophils Absolute: 0.3 10*3/uL (ref 0.0–0.5)
Eosinophils Relative: 2 %
HCT: 20.8 % — ABNORMAL LOW (ref 36.0–46.0)
Hemoglobin: 7.4 g/dL — ABNORMAL LOW (ref 12.0–15.0)
Immature Granulocytes: 1 %
Lymphocytes Relative: 4 %
Lymphs Abs: 0.7 10*3/uL (ref 0.7–4.0)
MCH: 37.6 pg — ABNORMAL HIGH (ref 26.0–34.0)
MCHC: 35.6 g/dL (ref 30.0–36.0)
MCV: 105.6 fL — ABNORMAL HIGH (ref 80.0–100.0)
Monocytes Absolute: 1.2 10*3/uL — ABNORMAL HIGH (ref 0.1–1.0)
Monocytes Relative: 8 %
Neutro Abs: 13.5 10*3/uL — ABNORMAL HIGH (ref 1.7–7.7)
Neutrophils Relative %: 85 %
Platelets: 37 10*3/uL — ABNORMAL LOW (ref 150–400)
RBC: 1.97 MIL/uL — ABNORMAL LOW (ref 3.87–5.11)
RDW: 16.9 % — ABNORMAL HIGH (ref 11.5–15.5)
WBC: 15.8 10*3/uL — ABNORMAL HIGH (ref 4.0–10.5)
nRBC: 0 % (ref 0.0–0.2)

## 2020-04-27 LAB — COMPREHENSIVE METABOLIC PANEL
ALT: 36 U/L (ref 0–44)
AST: 92 U/L — ABNORMAL HIGH (ref 15–41)
Albumin: 3.3 g/dL — ABNORMAL LOW (ref 3.5–5.0)
Alkaline Phosphatase: 96 U/L (ref 38–126)
Anion gap: 10 (ref 5–15)
BUN: 159 mg/dL — ABNORMAL HIGH (ref 8–23)
CO2: 18 mmol/L — ABNORMAL LOW (ref 22–32)
Calcium: 8.2 mg/dL — ABNORMAL LOW (ref 8.9–10.3)
Chloride: 96 mmol/L — ABNORMAL LOW (ref 98–111)
Creatinine, Ser: 2.07 mg/dL — ABNORMAL HIGH (ref 0.44–1.00)
GFR, Estimated: 25 mL/min — ABNORMAL LOW (ref >60)
Glucose, Bld: 97 mg/dL (ref 70–99)
Potassium: 4.1 mmol/L (ref 3.5–5.1)
Sodium: 124 mmol/L — ABNORMAL LOW (ref 135–145)
Total Bilirubin: 7.6 mg/dL — ABNORMAL HIGH (ref 0.3–1.2)
Total Protein: 6.7 g/dL (ref 6.5–8.1)

## 2020-04-27 LAB — PROTIME-INR
INR: 2.1 — ABNORMAL HIGH (ref 0.8–1.2)
Prothrombin Time: 23.1 seconds — ABNORMAL HIGH (ref 11.4–15.2)

## 2020-04-27 LAB — HIV ANTIBODY (ROUTINE TESTING W REFLEX): HIV Screen 4th Generation wRfx: NONREACTIVE

## 2020-04-27 LAB — SODIUM: Sodium: 124 mmol/L — ABNORMAL LOW (ref 135–145)

## 2020-04-27 LAB — SODIUM, URINE, RANDOM: Sodium, Ur: <10 mmol/L

## 2020-04-27 LAB — PROCALCITONIN: Procalcitonin: 0.88 ng/mL

## 2020-04-27 LAB — OSMOLALITY: Osmolality: 324 mOsm/kg (ref 275–295)

## 2020-04-27 LAB — OSMOLALITY, URINE: Osmolality, Ur: 354 mOsm/kg (ref 300–900)

## 2020-04-27 MED ORDER — MIDODRINE HCL 5 MG PO TABS
5.0000 mg | ORAL_TABLET | Freq: Three times a day (TID) | ORAL | Status: DC
  Administered 2020-04-27 – 2020-04-28 (×3): 5 mg via ORAL
  Filled 2020-04-27 (×3): qty 1

## 2020-04-27 MED ORDER — SODIUM CHLORIDE 0.9% FLUSH
10.0000 mL | INTRAVENOUS | Status: DC | PRN
  Administered 2020-04-29: 10 mL

## 2020-04-27 MED ORDER — ENSURE ENLIVE PO LIQD
237.0000 mL | Freq: Three times a day (TID) | ORAL | Status: DC
  Administered 2020-04-27 – 2020-05-02 (×14): 237 mL via ORAL

## 2020-04-27 MED ORDER — LACTULOSE 10 GM/15ML PO SOLN
20.0000 g | Freq: Two times a day (BID) | ORAL | Status: DC
  Administered 2020-04-27 – 2020-04-28 (×2): 20 g via ORAL
  Filled 2020-04-27 (×8): qty 30

## 2020-04-27 MED ORDER — ALBUMIN NICU 25% IV SOLUTION
50.0000 g | Freq: Every day | INTRAVENOUS | Status: DC
  Administered 2020-04-27 – 2020-04-29 (×3): 50 g via INTRAVENOUS
  Filled 2020-04-27 (×4): qty 200

## 2020-04-27 MED ORDER — SODIUM CHLORIDE 0.9 % IV SOLN: INTRAVENOUS | Status: DC

## 2020-04-27 MED ORDER — LACTULOSE 10 GM/15ML PO SOLN
30.0000 g | Freq: Three times a day (TID) | ORAL | Status: DC
  Administered 2020-04-27: 30 g via ORAL
  Filled 2020-04-27: qty 60

## 2020-04-27 MED ORDER — RIFAXIMIN 550 MG PO TABS
550.0000 mg | ORAL_TABLET | Freq: Two times a day (BID) | ORAL | Status: DC
  Administered 2020-04-27 – 2020-05-01 (×10): 550 mg via ORAL
  Filled 2020-04-27 (×13): qty 1

## 2020-04-27 MED ORDER — ADULT MULTIVITAMIN W/MINERALS CH
1.0000 | ORAL_TABLET | Freq: Every day | ORAL | Status: DC
  Administered 2020-04-27 – 2020-05-01 (×5): 1 via ORAL
  Filled 2020-04-27 (×5): qty 1

## 2020-04-27 MED ORDER — SODIUM CHLORIDE 0.9% FLUSH
10.0000 mL | Freq: Two times a day (BID) | INTRAVENOUS | Status: DC
  Administered 2020-04-27 – 2020-05-02 (×10): 10 mL

## 2020-04-27 MED ORDER — CHLORHEXIDINE GLUCONATE CLOTH 2 % EX PADS
6.0000 | MEDICATED_PAD | Freq: Every day | CUTANEOUS | Status: DC
  Administered 2020-04-27 – 2020-05-02 (×5): 6 via TOPICAL

## 2020-04-27 NOTE — Progress Notes (Signed)
Initial Nutrition Assessment  DOCUMENTATION CODES:   Not applicable  INTERVENTION:   -Ensure Enlive po TID, each supplement provides 350 kcal and 20 grams of protein -MVI with minerals daily  NUTRITION DIAGNOSIS:   Increased nutrient needs related to chronic illness (cirrhosis) as evidenced by estimated needs.  GOAL:   Patient will meet greater than or equal to 90% of their needs  MONITOR:   PO intake,Supplement acceptance,Labs,Weight trends,Skin,I & O's  REASON FOR ASSESSMENT:   Malnutrition Screening Tool    ASSESSMENT:   75 year old female with history of CAD, HTN, depression, CKD IIIb, nonalcoholic liver cirrhosis, not a liver transplant candidate, who was recently referred to palliative care who was sent in by GI for evaluation of altered mental status and concern for altered labs.  Pt admitted with decompensated hepatic cirrhosis.   Reviewed I/O's: +312 ml x 24 hours   UOP: 200 ml x 24 hours  Pt unavailable at time of visit.   Pt is currently on a heart healthy diet, however, no meal completions available at time of assessment.   Reviewed wt hx; pt has experienced a 15.1% wt loss over the past 11 months. While this is not significant for time frame, it is concerning given pt's advanced age and multiple co-morbidities. Pt is at risk for malnutrition, however, unable to identify at this time. Due to weight loss and multiple co-morbidities, pt would greatly benefit from addition of oral nutrition supplements to help meet nutritional needs.    Medications reviewed and include 0.9% sodium chloride infusion @ 75 ml/hr.   Medications reviewed and include lactulose.   Labs reviewed: Na: 124 (on IV supplementation).   Diet Order:   Diet Order            Diet Heart Room service appropriate? Yes; Fluid consistency: Thin  Diet effective now                 EDUCATION NEEDS:   No education needs have been identified at this time  Skin:  Skin Assessment:  Reviewed RN Assessment  Last BM:  04/26/20  Height:   Ht Readings from Last 1 Encounters:  04/27/20 5' 1"  (1.549 m)    Weight:   Wt Readings from Last 1 Encounters:  04/27/20 61.6 kg    Ideal Body Weight:  47.7 kg  BMI:  Body mass index is 25.66 kg/m.  Estimated Nutritional Needs:   Kcal:  1950-2150  Protein:  95-110 grams  Fluid:  > 1.9 L    Loistine Chance, RD, LDN, [place] Registered Dietitian II Certified Diabetes Care and Education Specialist Please refer to [facility] for RD and/or RD on-call/weekend/after hours pager

## 2020-04-27 NOTE — Telephone Encounter (Signed)
Patient admitted to [facility] yesterday

## 2020-04-27 NOTE — Plan of Care (Signed)

## 2020-04-27 NOTE — ED Notes (Signed)
2A called and notified patient on the way to floor at this time. No concerns posed to this RN.

## 2020-04-27 NOTE — Consult Note (Signed)
Cephas Darby, MD [address]  [place]  [place],  [address]  Main: [phone]  Fax: [phone] Pager: [phone]   Consultation  Referring Provider:     No ref. provider found Primary Care Physician:  Gladstone Lighter, MD Primary Gastroenterologist: [facility] transplant hepatology         Reason for Consultation:     Decompensated cirrhosis of liver  Date of Admission:  04/26/2020 Date of Consultation:  04/27/2020         HPI:   Ludmilla B Tackett is a 75 y.o. female nonalcoholic decompensated cirrhosis of liver, stage III CKD, with ascites, hepatorenal syndrome, hepatic encephalopathy.  Patient has been seen by [facility] transplant hepatology until recently and patient's husband wanted to establish care with GI locally for convenience.  I saw this patient for the first time on 04/19/2020 to establish care for decompensated cirrhosis of liver.  On her labs, she was found to have worsening creatinine.  Therefore, I held her Levaquin, diuretics and recheck her labs.  Labs from 2/21 revealed worsening of BUN/creatinine, serum sodium, new onset of leukocytosis and worsening of anemia.  Patient's husband also reported that her heart rate was high at home.  We also attempted outpatient paracentesis due to abdominal distention, there was minimal ascites found and paracentesis was not performed. Patient is also seen by palliative care this week as outpatient  Patient has been receiving albumin infusion every 2 weeks at [facility]  Due to worsening of her labs, I have advised patient's husband to admit her to the hospital.  She is admitted at [facility] yesterday.  Repeat labs revealed serum sodium 122, BUN/creatinine 157/2.12, hemoglobin 7.4, WBC 15.8, platelets 37, worsening bilirubin.  Chest x-ray revealed possible infiltrates, therefore she is empirically started on antibiotics for community-acquired pneumonia.  Patient is also started on ceftriaxone for SBP prophylaxis.  Patient denied abdominal  pain, nausea or vomiting, lethargic but able to speak softly. Patient's husband is bedside and nephrologist is also bedside  NSAIDs: None  Antiplts/Anticoagulants/Anti thrombotics: None  GI Procedures: Upper endoscopy 10/08/2016, report not available  Past Medical History:  Diagnosis Date  . Allergy   . Anemia    LOW PLATELET COUNTS  ([facility])  . Anxiety   . Carotid artery disease (HCC)    BILAT.        Marland Kitchen Chronic cough   . Cirrhosis, nonalcoholic (HCC)    PORTAL HTN, FATTY TISSUE   . Coronary artery disease   . DDD (degenerative disc disease), cervical    SPONDYLOLISTHESIS  . Depression   . GERD (gastroesophageal reflux disease)   . Headache    HX OCCULAR MIGRAINES  . Hypertension    ESSENTIAL  . Myocardial infarction ([place])   . Shortness of breath dyspnea    WITH EXERTION   . Tricuspid insufficiency    MODERATE     Past Surgical History:  Procedure Laterality Date  . ABDOMINAL HYSTERECTOMY    . ANTERIOR CERVICAL DECOMP/DISCECTOMY FUSION N/A 06/03/2014   Procedure: CERVICAL FIVE-CERVICAL SIX, CERVICAL SIX-CERVICAL SEVEN ANTERIOR CERVICAL DECOMPRESSION/DISCECTOMY ;  Surgeon: Karie Chimera, MD;  Location: [place] NEURO ORS;  Service: Neurosurgery;  Laterality: N/A;  . BREAST BIOPSY Left 09/02/2015   benign  . BREAST CYST ASPIRATION Left    neg  . CARDIAC CATHETERIZATION     2 STENTS 2013 IN [place]  . cardiac stents    . ESOPHAGOGASTRODUODENOSCOPY (EGD) WITH PROPOFOL N/A 02/21/2015   Procedure: ESOPHAGOGASTRODUODENOSCOPY (EGD)  WITH PROPOFOL;  Surgeon: Josefine Class, MD;  Location: [facility] ENDOSCOPY;  Service: Endoscopy;  Laterality: N/A;  . EYE SURGERY      Prior to Admission medications   Medication Sig Start Date End Date Taking? Authorizing Provider  acetaminophen (TYLENOL) 500 MG tablet Take 500 mg by mouth every 6 (six) hours as needed.     [provider]  albumin human 25 % bottle Inject 50 grams no matter what 04/19/20   Lin Landsman, MD  alendronate (FOSAMAX) 70 MG tablet Take 70 mg by mouth once a week. 12/28/19   [provider]  aMILoride (MIDAMOR) 5 MG tablet Take by mouth. 07/06/19 07/05/20  [provider]  aspirin EC 81 MG tablet Take 81 mg by mouth daily.  12/08/11   [provider]  furosemide (LASIX) 20 MG tablet Take 20 mg by mouth daily. 05/16/19   [provider]  lactulose (CHRONULAC) 10 GM/15ML solution Take 20 g by mouth daily.     [provider]  levofloxacin (LEVAQUIN) 250 MG tablet Take by mouth. 06/28/19   [provider]  mirtazapine (REMERON) 15 MG tablet Take 1 tablet (15 mg total) by mouth at bedtime. 07/28/19   Virginia Crews, MD  nitroGLYCERIN (NITROSTAT) 0.4 MG SL tablet Place 0.4 mg under the tongue every 5 (five) minutes as needed for chest pain.     [provider]  omeprazole (PRILOSEC) 20 MG capsule Take 20 mg by mouth daily.     [provider]  ondansetron (ZOFRAN) 4 MG tablet Take 4 mg by mouth every 8 (eight) hours as needed. 06/28/19   [provider]  potassium chloride 20 MEQ/15ML (10%) SOLN Take by mouth. 07/07/19   [provider]  propranolol (INDERAL) 10 MG tablet Take 10 mg by mouth 2 (two) times daily. 10/23/19   [provider]  rifaximin (XIFAXAN) 550 MG TABS tablet Take by mouth. 11/06/19   [provider]  sertraline (ZOLOFT) 25 MG tablet Take by mouth. 03/10/20   [provider]  Zinc Sulfate (ZINC 15) 66 MG TABS Take 1 tablet (66 mg total) by mouth daily. Unsure of dose 06/01/19   Ralene Muskrat B, MD   Current Facility-Administered Medications:  .  albumin human 25 % solution 50 g, 50 g, Intravenous, Daily, Vanga, Tally Due, MD .  cefTRIAXone (ROCEPHIN) 2 g in sodium chloride 0.9 % 100 mL IVPB, 2 g, Intravenous, Q24H, Damita Dunnings, Waldemar Dickens, MD .  Chlorhexidine Gluconate Cloth 2 % PADS 6 each, 6 each, Topical, Daily, Athena Masse, MD, 6 each at 04/27/20 1100 .   feeding supplement (ENSURE ENLIVE / ENSURE PLUS) liquid 237 mL, 237 mL, Oral, TID BM, Lorella Nimrod, MD, 237 mL at 04/27/20 1413 .  lactulose (CHRONULAC) 10 GM/15ML solution 20 g, 20 g, Oral, BID, Vanga, Tally Due, MD .  midodrine (PROAMATINE) tablet 5 mg, 5 mg, Oral, TID WC, Singh, Harmeet, MD .  multivitamin with minerals tablet 1 tablet, 1 tablet, Oral, Daily, Lorella Nimrod, MD, 1 tablet at 04/27/20 1411 .  rifaximin (XIFAXAN) tablet 550 mg, 550 mg, Oral, BID, Vanga, Tally Due, MD, 550 mg at 04/27/20 1411 .  sodium chloride flush (NS) 0.9 % injection 10-40 mL, 10-40 mL, Intracatheter, Q12H, Judd Gaudier V, MD .  sodium chloride flush (NS) 0.9 % injection 10-40 mL, 10-40 mL, Intracatheter, PRN, Athena Masse, MD   Family History  Problem Relation Age of Onset  . Liver cancer Mother   .  Lymphoma Father   . COPD Father   . COPD Brother   . Liver cancer Son   . Breast cancer Neg Hx   . Colon cancer Neg Hx      Social History   Tobacco Use  . Smoking status: Former Smoker    Types: Cigarettes  . Smokeless tobacco: Never Used  . Tobacco comment: formerly tried smoking cigarettes, no regular use  Vaping Use  . Vaping Use: Never used  Substance Use Topics  . Alcohol use: No  . Drug use: No    Allergies as of 04/26/2020 - Review Complete 04/26/2020  Allergen Reaction Noted  . Contrast media  [iodinated diagnostic agents] Rash 09/17/2016  . Azelastine Swelling 05/07/2014  . Flonase [fluticasone propionate] Swelling 05/07/2014  . Prednisone Swelling 05/07/2014  . Shellfish allergy Swelling and Other (See Comments) 01/05/2014  . Tramadol Nausea Only 05/07/2014    Review of Systems:    All systems reviewed and negative except where noted in HPI.   Physical Exam:  Vital signs in last 24 hours: Temp:  [97.5 F (36.4 C)-97.9 F (36.6 C)] 97.7 F (36.5 C) (02/23 1115) Pulse Rate:  [62-82] 66 (02/23 1115) Resp:  [16-21] 16 (02/23 1115) BP: (101-134)/(42-58) 102/46  (02/23 1115) SpO2:  [92 %-97 %] 96 % (02/23 1115) Weight:  [60.8 kg-61.6 kg] 61.6 kg (02/23 0027) Last BM Date: 04/26/20 General: Ill-appearing, cooperative in NAD Head:  Normocephalic and atraumatic. Eyes:   deep icterus.   Conjunctiva pale. PERRLA. Ears:  Normal auditory acuity. Neck:  Supple; no masses or thyroidomegaly Lungs: Respirations even and unlabored. Lungs clear to auscultation bilaterally.   No wheezes, crackles, or rhonchi.  Heart:  Regular rate and rhythm;  Without murmur, clicks, rubs or gallops Abdomen:  Soft, moderately distended, nontender. Normal bowel sounds. No appreciable masses or hepatomegaly.  No rebound or guarding.  Rectal:  Not performed. Msk:  Symmetrical without gross deformities.  Strength generalized weakness Extremities: 3+ edema, no cyanosis or clubbing. Neurologic:  Alert and oriented x3;  grossly normal neurologically. Skin:  Intact without significant lesions or rashes. Psych: Lethargic, cooperative  LAB RESULTS: CBC Latest Ref Rng & Units 04/27/2020 04/26/2020 04/25/2020  WBC 4.0 - 10.5 K/uL 15.8(H) 16.4(H) 14.8(H)  Hemoglobin 12.0 - 15.0 g/dL 7.4(L) 8.0(L) 8.9(L)  Hematocrit 36.0 - 46.0 % 20.8(L) 22.8(L) 24.3(L)  Platelets 150 - 400 K/uL 37(L) 45(L) 45(L)    BMET BMP Latest Ref Rng & Units 04/27/2020 04/27/2020 04/26/2020  Glucose 70 - 99 mg/dL 97 - 130(H)  BUN 8 - 23 mg/dL 159(H) - 157(H)  Creatinine 0.44 - 1.00 mg/dL 2.07(H) - 2.12(H)  BUN/Creat Ratio 12 - 28 - - -  Sodium 135 - 145 mmol/L 124(L) 124(L) 122(L)  Potassium 3.5 - 5.1 mmol/L 4.1 - 4.0  Chloride 98 - 111 mmol/L 96(L) - 93(L)  CO2 22 - 32 mmol/L 18(L) - 17(L)  Calcium 8.9 - 10.3 mg/dL 8.2(L) - 8.8(L)    LFT Hepatic Function Latest Ref Rng & Units 04/27/2020 04/26/2020 04/25/2020  Total Protein 6.5 - 8.1 g/dL 6.7 7.2 7.3  Albumin 3.5 - 5.0 g/dL 3.3(L) 3.6 3.7  AST 15 - 41 U/L 92(H) 106(H) 110(H)  ALT 0 - 44 U/L 36 39 41  Alk Phosphatase 38 - 126 U/L 96 116 110  Total Bilirubin  0.3 - 1.2 mg/dL 7.6(H) 8.0(H) 7.0(H)  Bilirubin, Direct 0.1 - 0.5 mg/dL - - -     STUDIES: DG Chest Portable 1 View  Result Date:  04/26/2020 CLINICAL DATA:  75 year old female with shortness of breath. EXAM: PORTABLE CHEST 1 VIEW COMPARISON:  Chest radiograph dated 05/29/2019. FINDINGS: Right-sided Port-A-Cath with tip at the cavoatrial junction. Left lung base opacity, likely combination of pleural effusion and associated atelectasis or infiltrate. There is diffuse vascular and interstitial prominence consistent with congestion and/or edema. No pneumothorax. Atherosclerotic calcification of the aortic arch. No acute osseous pathology. Lower cervical ACDF. IMPRESSION: Left-sided pleural effusion and associated atelectasis/infiltrate. Electronically Signed   By: Anner Crete M.D.   On: 04/26/2020 21:24      Impression / Plan:   Nou B Lias is a 75 y.o. female with decompensated NASH cirrhosis with ascites, hepatic encephalopathy, history of coronary disease s/p PCI, history of chronic kidney disease is admitted with acute on chronic decompensated cirrhosis of liver leading to worsening of renal function, worsening of hyponatremia as well as anemia  Decompensated cirrhosis of liver, child class C, meld sodium 33 Agree with empiric antibiotics to cover for possible community-acquired pneumonia Agree with ceftriaxone for SBP prophylaxis Recommend blood cultures, no evidence of UTI based on urine analysis No evidence of active GI bleed to explain anemia at this time. Most likely secondary to decompensated liver disease, continue to monitor Decrease lactulose to 20g twice daily due to AKI and concern for dehydration from diarrhea that can worsen renal function. Adjust the lactulose dose if needed Add rifaximin 550 mg twice daily  AKI on CKD and hyponatremia, likely hepatorenal syndrome Start albumin 50 g daily Start midodrine 5 mg 3 times daily Nephrology is on board, appreciate  recommendations Monitor BMP daily, if no improvement in the next 24 hours, recommend to start octreotide  Patient is not a liver transplant candidate due to her overall functional status. Palliative care consultation is appropriate. If patient is not making meaningful recovery during this admission, recommend hospice  Thank you for involving me in the care of this patient. GI will follow along with you    LOS: 1 day   Sherri Sear, MD  04/27/2020, 2:42 PM   Note: This dictation was prepared with Dragon dictation along with smaller phrase technology. Any transcriptional errors that result from this process are unintentional.

## 2020-04-27 NOTE — ED Notes (Signed)
Pt ambulatory to toilet with one assist. 1 occurrence of urine output.

## 2020-04-27 NOTE — Telephone Encounter (Signed)
-----   Message from Storm Frisk, [place] sent at 04/26/2020  5:21 PM EST ----- Regarding: ED admit Hi Riyana, Biel called and left a message for Marius Ditch to let her know Lora has been admitted into the hospital. He said, he can be reached at [phone].    Thanks

## 2020-04-27 NOTE — TOC Initial Note (Addendum)
Transition of Care [facility]) - Initial/Assessment Note    Patient Details  Name: Shannon Sawyer MRN: [id number] Date of Birth: 1945/10/06  [facility] CM/SW Contact:    Kerin Salen, RN Phone Number: 04/27/2020, 3:11 PM  Clinical Narrative: Spoke with patient, lives at home with husband. Patient states husband helps with ADL's, cooking and shopping because she is too weak to do anything but ly in hospital bed. Uses [facility], husband pick up medications and transports her to medical appointments. Patient ask if SNF was recommended by Attending will she accept, patient replied NO, she had a bad experience before, prefers to go home. Palliative Nurse visit last week, not sure of agency nor nurse name. PCP at the [facility], last visit week ago.    Home Health PT/OT arrangements with [facility] per PT recommendations. Wheelchair also recommended, [employer] representative notified.               Barriers to Discharge: Continued Medical Work up   Patient Goals and CMS Choice Patient states their goals for this hospitalization and ongoing recovery are:: To return home.   Choice offered to / list presented to : NA  Expected Discharge Plan and Services   In-house Referral: Clinical Social Work,Hospice / Palliative Care Discharge Planning Services: CM Consult Post Acute Care Choice: NA Living arrangements for the past 2 months: Single Family Home                 DME Arranged: N/A DME Agency: NA                  Prior Living Arrangements/Services Living arrangements for the past 2 months: Single Family Home Lives with:: Spouse Patient language and need for interpreter reviewed:: Yes Do you feel safe going back to the place where you live?: Yes      Need for Family Participation in Patient Care: Yes (Comment) Care giver support system in place?: Yes (comment) Current home services: DME Criminal Activity/Legal Involvement Pertinent to Current  Situation/Hospitalization: No - Comment as needed  Activities of Daily Living Home Assistive Devices/Equipment: [place] (specify type),Bedside commode/3-in-1,Shower chair with back ADL Screening (condition at time of admission) Patient's cognitive ability adequate to safely complete daily activities?: Yes Is the patient deaf or have difficulty hearing?: No Does the patient have difficulty seeing, even when wearing glasses/contacts?: No Does the patient have difficulty concentrating, remembering, or making decisions?: No Patient able to express need for assistance with ADLs?: Yes Does the patient have difficulty dressing or bathing?: Yes Independently performs ADLs?: No Communication: Independent Dressing (OT): Needs assistance Is this a change from baseline?: Pre-admission baseline Grooming: Needs assistance Is this a change from baseline?: Pre-admission baseline Feeding: Independent Bathing: Needs assistance Is this a change from baseline?: Pre-admission baseline Toileting: Needs assistance Is this a change from baseline?: Pre-admission baseline In/Out Bed: Needs assistance Is this a change from baseline?: Pre-admission baseline Walks in Home: Independent with device (comment) (RW) Does the patient have difficulty walking or climbing stairs?: Yes Weakness of Legs: Both Weakness of Arms/Hands: Both  Permission Sought/Granted Permission sought to share information with : Case Manager Permission granted to share information with : No  Share Information with NAME: Husband Shannon Sawyer           Emotional Assessment Appearance:: Appears older than stated age Attitude/Demeanor/Rapport: Inconsistent,Guarded Affect (typically observed): Flat,Guarded Orientation: : Oriented to Self,Oriented to Place Alcohol / Substance Use: Not Applicable Psych Involvement: No (comment)  Admission diagnosis:  Hyponatremia [E87.1] Pleural effusion [J90] Elevated bilirubin [R17] Decompensated  hepatic cirrhosis ([place]) [K72.90, K74.60] Patient Active Problem List   Diagnosis Date Noted  . Acute metabolic encephalopathy 04/88/8916  . CAP (community acquired pneumonia) 04/26/2020  . Vaginal yeast infection 07/29/2019  . Toenail fungus 07/29/2019  . SBP (spontaneous bacterial peritonitis) ([place]) 07/29/2019  . Poor dentition 07/28/2019  . Dysphagia 07/28/2019  . Post-menopausal bleeding 07/28/2019  . Decompensated hepatic cirrhosis, non-alcoholic ([place]) 94/50/3888  . Hypokalemia 06/23/2019  . Stage 3b chronic kidney disease ([place]) 06/23/2019  . Leukocytosis 06/17/2019  . HLD (hyperlipidemia) 05/29/2019  . Anxiety 05/29/2019  . CAD (coronary artery disease) 05/29/2019  . Acute renal failure superimposed on stage IIIb chronic kidney disease ([place]) 05/29/2019  . Generalized weakness 05/29/2019  . Acute on chronic hyponatremia 05/29/2019  . Seasonal allergic rhinitis due to pollen 05/07/2018  . Thrombocytopenia ([place]) 02/03/2018  . H/O acute myocardial infarction 07/11/2017  . GERD (gastroesophageal reflux disease) 07/11/2017  . Bradycardia 11/06/2016  . Severe anxiety with panic 11/15/2015  . Memory change 06/30/2015  . H/O acute pancreatitis 11/18/2014  . Cervical radiculopathy 06/03/2014  . TI (tricuspid incompetence) 02/12/2014  . Carotid artery narrowing 01/12/2014  . Non-alcoholic cirrhosis ([place]) 28/00/3491  . Anemia, iron deficiency 01/12/2014  . Breathlessness on exertion 01/12/2014  . Bilateral carotid artery stenosis 01/12/2014  . Cervical radiculitis 11/04/2013  . DDD (degenerative disc disease), cervical 11/04/2013  . SPL (spondylolisthesis) 11/04/2013  . Arteriosclerosis of coronary artery 05/20/2013  . Essential (primary) hypertension 05/20/2013  . Combined fat and carbohydrate induced hyperlipemia 05/20/2013  . NSTEMI (non-ST elevated myocardial infarction) ([place]) 12/04/2011  . Hyperglycemia 12/04/2011   PCP:  Gladstone Lighter, MD Pharmacy:   [place], [place] - [address] [place] [address] Phone: [phone] Fax: [phone]     Social Determinants of Health (SDOH) Interventions    Readmission Risk Interventions Readmission Risk Prevention Plan 04/27/2020  Transportation Screening Complete  PCP or Specialist Appt within 3-5 Days Complete  HRI or [place] Complete  Social Work Consult for [place] Planning/Counseling Complete  Palliative Care Screening Complete  Medication Review Press photographer) Complete  Some recent data might be hidden

## 2020-04-27 NOTE — Evaluation (Signed)
Physical Therapy Evaluation Patient Details Name: Shannon Sawyer MRN: [id number] DOB: [birth year] Today's Date: 04/27/2020   History of Present Illness  Pt admitted for hepatic cirrhosis with complaints of AMS. History includes CAD, HTN, depression, and CKD.  Clinical Impression  Pt is a pleasant 75 year old female who was admitted for hepatic cirrhosis. Pt performs bed mobility/transfers with cga and ambulation with min assist and RW. Pt demonstrates deficits with strength/mobility/fatigue. Able to ambulate short distance to [facility] and reports she feels close to baseline. Politely refused to sit in recliner despite encouragement. Needs 24/7 assist which husband able to provide. Would benefit from [facility] for long distances. Would benefit from skilled PT to address above deficits and promote optimal return to PLOF. Recommend transition to [place] upon discharge from acute hospitalization.     Follow Up Recommendations Home health PT;Supervision/Assistance - 24 hour    Equipment Recommendations  Wheelchair (measurements PT)    Recommendations for Other Services       Precautions / Restrictions Precautions Precautions: Fall Restrictions Weight Bearing Restrictions: No      Mobility  Bed Mobility Overal bed mobility: Needs Assistance Bed Mobility: Supine to Sit     Supine to sit: Min guard     General bed mobility comments: able to initiate movement with cues for sequencing. once seated at EOB, able to adjust and demonstrate upright posture.    Transfers Overall transfer level: Needs assistance Equipment used: Rolling walker (2 wheeled) Transfers: Sit to/from Stand Sit to Stand: Min guard         General transfer comment: increased effort required with quick fatigue. Once standing, upright posture noted. Cues for hand placement during standing attempts from multiple surfaces.  Ambulation/Gait Ambulation/Gait assistance: Min assist Gait Distance (Feet): 3 Feet Assistive device:  Rolling walker (2 wheeled) Gait Pattern/deviations: Step-to pattern     General Gait Details: ambulated short distance to [facility] needing to have BM. Cues for safety. Currently needs hands on assist for mobility and further distance limited due to fatigue.  Stairs            Wheelchair Mobility    Modified Rankin (Stroke Patients Only)       Balance Overall balance assessment: Needs assistance Sitting-balance support: Feet supported Sitting balance-Leahy Scale: Good     Standing balance support: Bilateral upper extremity supported Standing balance-Leahy Scale: Fair                               Pertinent Vitals/Pain Pain Assessment: No/denies pain    Home Living Family/patient expects to be discharged to:: Private residence Living Arrangements: Spouse/significant other Available Help at Discharge: Family;Available 24 hours/day Type of Home: House Home Access: Ramped entrance     Home Layout: One level Home Equipment: Walker - 2 wheels;Walker - 4 wheels;Cane - single point;Bedside commode;Hospital bed (has WC, however in bad shape)      Prior Function Level of Independence: Needs assistance         Comments: uses RW for short distances without assistance. Past 2 weeks, spouse reports couple of near miss falls due to weakness. Able to ambulate to bathroom and uses BSC at night     Hand Dominance        Extremity/Trunk Assessment   Upper Extremity Assessment Upper Extremity Assessment: Generalized weakness (B UE grossly 3/5)    Lower Extremity Assessment Lower Extremity Assessment: Generalized weakness (B LE grossly 3/5-fatigues quickly)  Communication   Communication: No difficulties  Cognition Arousal/Alertness: Awake/alert Behavior During Therapy: WFL for tasks assessed/performed Overall Cognitive Status: Within Functional Limits for tasks assessed                                        General Comments  General comments (skin integrity, edema, etc.): increased edema in B ankles    Exercises Other Exercises Other Exercises: Pt with successful urine and BM. Needs max assist to wipe due to balance/weakness. Reports she is typically able to perform hygiene indep. Other Exercises: Supine ther-ex performed x 5-10 reps including SLRs and heel slides, Fatigues quickly with increased WOB noted.   Assessment/Plan    PT Assessment Patient needs continued PT services  PT Problem List Decreased strength;Decreased activity tolerance;Decreased balance;Decreased mobility       PT Treatment Interventions Gait training;DME instruction;Therapeutic exercise;Balance training    PT Goals (Current goals can be found in the Care Plan section)  Acute Rehab PT Goals Patient Stated Goal: to get stronger PT Goal Formulation: With patient Time For Goal Achievement: 05/11/20 Potential to Achieve Goals: Good    Frequency Min 2X/week   Barriers to discharge        Co-evaluation               AM-PAC PT "6 Clicks" Mobility  Outcome Measure Help needed turning from your back to your side while in a flat bed without using bedrails?: A Little Help needed moving from lying on your back to sitting on the side of a flat bed without using bedrails?: A Little Help needed moving to and from a bed to a chair (including a wheelchair)?: A Little Help needed standing up from a chair using your arms (e.g., wheelchair or bedside chair)?: A Little Help needed to walk in hospital room?: A Lot Help needed climbing 3-5 steps with a railing? : Total 6 Click Score: 15    End of Session Equipment Utilized During Treatment: Gait belt Activity Tolerance: Patient limited by fatigue Patient left: in bed;with bed alarm set (despite encouragement to sit in recliner) Nurse Communication: Mobility status PT Visit Diagnosis: Muscle weakness (generalized) (M62.81);Difficulty in walking, not elsewhere classified  (R26.2);Unsteadiness on feet (R26.81)    Time: 2010-0712 PT Time Calculation (min) (ACUTE ONLY): 27 min   Charges:   PT Evaluation $PT Eval Low Complexity: 1 Low PT Treatments $Therapeutic Activity: 8-22 mins        Greggory Stallion, PT, DPT [phone]   Ray,Stephanie 04/27/2020, 4:28 PM

## 2020-04-27 NOTE — Consult Note (Signed)
[address] [place], [place] [address] Phone [phone]. Fax [phone]  Date: 04/27/2020                  Patient Name:  Shannon Sawyer  MRN: [id number]  DOB: 06-17-1945  Age / Sex: 75 y.o., female         PCP: Gladstone Lighter, MD                 Service Requesting Consult: IM/ Lorella Nimrod, MD                 Reason for Consult: ARF            History of Present Illness: Patient is a 75 year old white female with a past history of hypertension coronary artery disease, congestive heart failure, chronic kidney disease stage 4, NASH not a candidate for liver transplant no sent for admission to the hospital for evaluation of acute kidney injury on top of chronic kidney disease with hyponatremia.  She was given IV fluids with normal saline as a 500 mL bolus on admission.  She is presently on 75 cc an hour with normal saline.  She has been on furosemide 20 mg a day with fluid restriction to 1 L a day.  As per the husband patient has been drinking extra water.  She had cirrhosis diagnosed about 7 years ago.  She also has been on SSRI for anxiety depression.  Patient had been on amiloride and also furosemide as outpatient.  The amiloride was discontinued due to worsening renal indicis.  Her mental status has been intact.   Medications: Outpatient medications: Medications Prior to Admission  Medication Sig Dispense Refill Last Dose  . acetaminophen (TYLENOL) 500 MG tablet Take 500 mg by mouth every 6 (six) hours as needed.      Marland Kitchen albumin human 25 % bottle Inject 50 grams no matter what 50 mL 0   . alendronate (FOSAMAX) 70 MG tablet Take 70 mg by mouth once a week.     Marland Kitchen aMILoride (MIDAMOR) 5 MG tablet Take by mouth.     Marland Kitchen aspirin EC 81 MG tablet Take 81 mg by mouth daily.      . furosemide (LASIX) 20 MG tablet Take 20 mg by mouth daily.     Marland Kitchen lactulose (CHRONULAC) 10 GM/15ML solution Take 20 g by mouth daily.      Marland Kitchen levofloxacin (LEVAQUIN) 250 MG tablet Take by mouth.      . mirtazapine (REMERON) 15 MG tablet Take 1 tablet (15 mg total) by mouth at bedtime. 90 tablet 1   . nitroGLYCERIN (NITROSTAT) 0.4 MG SL tablet Place 0.4 mg under the tongue every 5 (five) minutes as needed for chest pain.      Marland Kitchen omeprazole (PRILOSEC) 20 MG capsule Take 20 mg by mouth daily.      . ondansetron (ZOFRAN) 4 MG tablet Take 4 mg by mouth every 8 (eight) hours as needed.     . potassium chloride 20 MEQ/15ML (10%) SOLN Take by mouth.     . propranolol (INDERAL) 10 MG tablet Take 10 mg by mouth 2 (two) times daily.     . rifaximin (XIFAXAN) 550 MG TABS tablet Take by mouth.     . sertraline (ZOLOFT) 25 MG tablet Take by mouth.     . Zinc Sulfate (ZINC 15) 66 MG TABS Take 1 tablet (66 mg total) by mouth daily. Unsure of dose  0     Current medications:  Current Facility-Administered Medications  Medication Dose Route Frequency Provider Last Rate Last Admin  . 0.9 %  sodium chloride infusion   Intravenous Continuous Athena Masse, MD 75 mL/hr at 04/27/20 0044 New Bag at 04/27/20 0044  . albumin human 25 % solution 50 g  50 g Intravenous Daily Vanga, Tally Due, MD      . azithromycin (ZITHROMAX) 500 mg in sodium chloride 0.9 % 250 mL IVPB  500 mg Intravenous Q24H Judd Gaudier V, MD      . cefTRIAXone (ROCEPHIN) 2 g in sodium chloride 0.9 % 100 mL IVPB  2 g Intravenous Q24H Athena Masse, MD      . Chlorhexidine Gluconate Cloth 2 % PADS 6 each  6 each Topical Daily Athena Masse, MD   6 each at 04/27/20 1100  . lactulose (CHRONULAC) 10 GM/15ML solution 20 g  20 g Oral BID Vanga, Tally Due, MD      . rifaximin Doreene Nest) tablet 550 mg  550 mg Oral BID Lin Landsman, MD      . sodium chloride flush (NS) 0.9 % injection 10-40 mL  10-40 mL Intracatheter Q12H Judd Gaudier V, MD      . sodium chloride flush (NS) 0.9 % injection 10-40 mL  10-40 mL Intracatheter PRN Athena Masse, MD          Allergies: Allergies  Allergen Reactions  . Contrast Media  [Iodinated  Diagnostic Agents] Rash    Blister rash   . Azelastine Swelling  . Flonase [Fluticasone Propionate] Swelling  . Prednisone Swelling  . Shellfish Allergy Swelling and Other (See Comments)    Per spouse, determined on allergy test.  . Tramadol Nausea Only      Past Medical History: Past Medical History:  Diagnosis Date  . Allergy   . Anemia    LOW PLATELET COUNTS  ([facility])  . Anxiety   . Carotid artery disease (HCC)    BILAT.        Marland Kitchen Chronic cough   . Cirrhosis, nonalcoholic (HCC)    PORTAL HTN, FATTY TISSUE   . Coronary artery disease   . DDD (degenerative disc disease), cervical    SPONDYLOLISTHESIS  . Depression   . GERD (gastroesophageal reflux disease)   . Headache    HX OCCULAR MIGRAINES  . Hypertension    ESSENTIAL  . Myocardial infarction ([place])   . Shortness of breath dyspnea    WITH EXERTION   . Tricuspid insufficiency    MODERATE      Past Surgical History: Past Surgical History:  Procedure Laterality Date  . ABDOMINAL HYSTERECTOMY    . ANTERIOR CERVICAL DECOMP/DISCECTOMY FUSION N/A 06/03/2014   Procedure: CERVICAL FIVE-CERVICAL SIX, CERVICAL SIX-CERVICAL SEVEN ANTERIOR CERVICAL DECOMPRESSION/DISCECTOMY ;  Surgeon: Karie Chimera, MD;  Location: [place] [facility]UR[facility];  Service: Neurosurgery;  Laterality: N/A;  . BREAST BIOPSY Left 09/02/2015   benign  . BREAST CYST ASPIRATION Left    neg  . CARDIAC CATHETERIZATION     2 STENTS 2013 IN [place]  . cardiac stents    . ESOPHAGOGASTRODUODENOSCOPY (EGD) WITH PROPOFOL N/A 02/21/2015   Procedure: ESOPHAGOGASTRODUODENOSCOPY (EGD) WITH PROPOFOL;  Surgeon: Josefine Class, MD;  Location: [facility] ENDOSCOPY;  Service: Endoscopy;  Laterality: N/A;  . EYE SURGERY       Family History: Family History  Problem Relation Age of Onset  . Liver cancer Mother   . Lymphoma Father   . COPD Father   .  COPD Brother   . Liver cancer Son   . Breast cancer Neg Hx   . Colon cancer Neg Hx      Social  History: Social History   Socioeconomic History  . Marital status: Married    Spouse name: Sonia Side  . Number of children: 2  . Years of education: Not on file  . Highest education level: Some college, no degree  Occupational History  . 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 Occupation: retired  Tobacco Use  . Smoking status: Former Smoker    Types: Cigarettes  . Smokeless tobacco: Never Used  . Tobacco comment: formerly tried smoking cigarettes, no regular use  Vaping Use  . Vaping Use: Never used  Substance and Sexual Activity  . Alcohol use: No  . Drug use: No  . Sexual activity: Yes    Partners: Male    Birth control/protection: Post-menopausal, Surgical  Other Topics Concern  . Not on file  Social History Narrative   One child was stillborn, and one son died of liver cancer. Pt has one living biological child, and 2 stepchildren.   Social Determinants of Health   Financial Resource Strain: Not on file  Food Insecurity: Not on file  Transportation Needs: Not on file  Physical Activity: Not on file  Stress: Not on file  Social Connections: Not on file  Intimate Partner Violence: Not on file     Review of Systems: Gen: Patient is awake and alert in no acute distress HEENT: Within normal limits CV: No chest pain or shortness of breath Resp: Denies any cough GI: Complains of abdominal swelling GU : Denies any symptoms Psych: Feels depressed Neuro: Denies any lethargy   Vital Signs: Blood pressure (!) 102/46, pulse 66, temperature 97.7 F (36.5 C), temperature source Oral, resp. rate 16, height 5\' 1"  (1.549 m), weight 61.6 kg, SpO2 96 %.   Intake/Output Summary (Last 24 hours) at 04/27/2020 1258 Last data filed at 04/27/2020 0092 Gross per 24 hour  Intake 511.89 ml  Output 350 ml  Net 161.89 ml    Weight trends: [facility] Weights   04/26/20 1547 04/27/20 0027  Weight: 60.8 kg 61.6 kg    Physical Exam: General:  Well-built  HEENT  within normal  limits  Neck:  Neck supple  Lungs:  Decreased breath sounds bilaterally  Heart::  S1-S2 regular rate and rhythm  Abdomen:  Distended with ascites  Extremities:  Trace edema  Neurologic:  Awake and alert oriented x3  Skin:  Skin intact    Lab results: Basic Metabolic Panel: Recent Labs  Lab 04/25/20 1401 04/26/20 1601 04/27/20 0415  NA 126* 122* 124*  124*  K 4.0 4.0 4.1  CL 97* 93* 96*  CO2 16* 17* 18*  GLUCOSE 130* 130* 97  BUN 152* 157* 159*  CREATININE 1.94* 2.12* 2.07*  CALCIUM 9.1 8.8* 8.2*  MG 2.5*  --   --   PHOS 4.9*  --   --     Liver Function Tests: Recent Labs  Lab 04/27/20 0415  AST 92*  ALT 36  ALKPHOS 96  BILITOT 7.6*  PROT 6.7  ALBUMIN 3.3*    Recent Labs  Lab 04/26/20 2054  AMMONIA 44*    CBC: Recent Labs  Lab 04/26/20 1601 04/27/20 0415  WBC 16.4* 15.8*  NEUTROABS  --  13.5*  HGB 8.0* 7.4*  HCT 22.8* 20.8*  MCV 105.6* 105.6*  PLT 45* 37*     Microbiology: Recent Results (from  the past 720 hour(s))  Resp Panel by RT-PCR (Flu A&B, Covid) Nasopharyngeal Swab     Status: None   Collection Time: 04/26/20  8:54 PM   Specimen: Nasopharyngeal Swab; Nasopharyngeal(NP) swabs in vial transport medium  Result Value Ref Range Status   SARS Coronavirus 2 by RT PCR NEGATIVE NEGATIVE Final    Comment: (NOTE) SARS-CoV-2 target nucleic acids are NOT DETECTED.  The SARS-CoV-2 RNA is generally detectable in upper respiratory specimens during the acute phase of infection. The lowest concentration of SARS-CoV-2 viral copies this assay can detect is 138 copies/mL. A negative result does not preclude SARS-Cov-2 infection and should not be used as the sole basis for treatment or other patient management decisions. A negative result may occur with  improper specimen collection/handling, submission of specimen other than nasopharyngeal swab, presence of viral mutation(s) within the areas targeted by this assay, and inadequate number of  viral copies(<138 copies/mL). A negative result must be combined with clinical observations, patient history, and epidemiological information. The expected result is Negative.  Fact Sheet for Patients:  EntrepreneurPulse.com.au  Fact Sheet for Healthcare Providers:  IncredibleEmployment.be  This test is no t yet approved or cleared by the [place] FDA and  has been authorized for detection and/or diagnosis of SARS-CoV-2 by FDA under an Emergency Use Authorization (EUA). This EUA will remain  in effect (meaning this test can be used) for the duration of the COVID-19 declaration under Section 564(b)(1) of the Act, 21 U.S.C.section 360bbb-3(b)(1), unless the authorization is terminated  or revoked sooner.       Influenza A by PCR NEGATIVE NEGATIVE Final   Influenza B by PCR NEGATIVE NEGATIVE Final    Comment: (NOTE) The Xpert Xpress SARS-CoV-2/FLU/RSV plus assay is intended as an aid in the diagnosis of influenza from Nasopharyngeal swab specimens and should not be used as a sole basis for treatment. Nasal washings and aspirates are unacceptable for Xpert Xpress SARS-CoV-2/FLU/RSV testing.  Fact Sheet for Patients: EntrepreneurPulse.com.au  Fact Sheet for Healthcare Providers: IncredibleEmployment.be  This test is not yet approved or cleared by the [place] FDA and has been authorized for detection and/or diagnosis of SARS-CoV-2 by FDA under an Emergency Use Authorization (EUA). This EUA will remain in effect (meaning this test can be used) for the duration of the COVID-19 declaration under Section 564(b)(1) of the Act, 21 U.S.C. section 360bbb-3(b)(1), unless the authorization is terminated or revoked.  Performed at [facility], [address] [place], [place] [address]      Coagulation Studies: Recent Labs    04/27/20 7035  LABPROT 23.1*  INR 2.1*    Urinalysis: Recent  Labs    04/26/20 1601  COLORURINE YELLOW*  LABSPEC 1.012  PHURINE 5.0  GLUCOSEU NEGATIVE  HGBUR NEGATIVE  BILIRUBINUR NEGATIVE  KETONESUR NEGATIVE  PROTEINUR NEGATIVE  NITRITE NEGATIVE  LEUKOCYTESUR MODERATE*        Imaging: DG Chest Portable 1 View  Result Date: 04/26/2020 CLINICAL DATA:  75 year old female with shortness of breath. EXAM: PORTABLE CHEST 1 VIEW COMPARISON:  Chest radiograph dated 05/29/2019. FINDINGS: Right-sided Port-A-Cath with tip at the cavoatrial junction. Left lung base opacity, likely combination of pleural effusion and associated atelectasis or infiltrate. There is diffuse vascular and interstitial prominence consistent with congestion and/or edema. No pneumothorax. Atherosclerotic calcification of the aortic arch. No acute osseous pathology. Lower cervical ACDF. IMPRESSION: Left-sided pleural effusion and associated atelectasis/infiltrate. Electronically Signed   By: Anner Crete M.D.   On: 04/26/2020 21:24  Assessment & Plan: Pt is a 75 y.o.   female with history of hypertension, coronary artery disease, congestive heart failure, nonalcoholic liver disease with cirrhosis and chronic kidney disease, was admitted on 04/26/2020 with Hyponatremia [E87.1] Pleural effusion [J90] Elevated bilirubin [R17] Decompensated hepatic cirrhosis ([place]) [K72.90, K74.60]     #Hyponatremia and acute kidney injury on the top of chronic kidney disease 4(Cr 1.71/GFR 29) from 06/16/2019) .  She has severe azotemia most likely due to overdiuresis.  Patient was given IV fluids since admission with isotonic saline. The hyponatremia is most likely dilutional secondary to fluid overload. At the present time I would like to hold the IV fluids and limit her fluid intake to 1 L a day. - We should also continue the midodrine for hypotension. - d/c iv NS - consider octreotide  Patient need to be closely monitored for hepatorenal syndrome which seems to be imminent in the near  future.  I had extensive discussion with patient's husband at bedside in detail and answered all his questions to his satisfaction. We will continue to follow the patient closely during the hospitalization and keep you updated on the progress and prognosis thank you        LOS: 1 Harmeet Singh 2/23/20221:35 PM    Note: This note was prepared with Dragon dictation. Any transcription errors are unintentional

## 2020-04-27 NOTE — Consult Note (Signed)
Consultation Note Date: 04/27/2020   Patient Name: Shannon Sawyer  DOB: 04-Jul-1945  MRN: [id number]  Age / Sex: 75 y.o., female  PCP: Gladstone Lighter, MD Referring Physician: Lorella Nimrod, MD  Reason for Consultation: Establishing goals of care  HPI/Patient Profile: 75 y.o. female  with past medical history of CAD, HTN, depression, CKD IIIb, nonalcoholic liver cirrhosis, and not a liver transplant candidate admitted on 04/26/2020 with abnormal labs - worsening renal function. Hyperbilirubinemia, hyponatremia, leukocytosis, and anemia. Patient admitted with decompensated hepatic cirrhosis and acute renal failure. Patient has seen outpatient palliative recently. PMT consulted to discuss [place].   Clinical Assessment and Goals of Care: I have reviewed medical records including EPIC notes, labs and imaging, received report from RN, assessed the patient and then met with patient and spouse  to discuss diagnosis prognosis, GOC, EOL wishes, disposition and options.  I introduced Palliative Medicine as specialized medical care for people living with serious illness. It focuses on providing relief from the symptoms and stress of a serious illness. The goal is to improve quality of life for both the patient and the family.  We discussed a brief life review of the patient. Spouse serves as patient's 24/7 caregiver at home.   As far as functional and nutritional status, spouse tells me patient has become weaker over the past 3-4 weeks. Was able to ambulate well with walker but this is now declining. He tells me about poor appetite for ~ 1 year. He also speaks of drowsiness, confusion.   We review outpatient palliative visit 2 days ago.    We discussed patient's current illness and what it means in the larger context of patient's on-going co-morbidities.  Natural disease trajectory and expectations at EOL were discussed. Spouse has good understanding of  situation. He recently spoke with nephrology, we reviewed that conversation.   I attempted to elicit values and goals of care important to the patient.  Spouse shares that quality of life is what is most important to both of them. Also, he would like patient home with him as much as possible - though, he notes he is open to a rehab stay if it is felt it would benefit patient. He would like to see PT work with patient in hospital and see how she does.   We review that patient has ACP documents in chart - DNR, HCPOA, and living will. We review code status as she is currently listed as full code. Confirmed DNR.   Discussed with patient/family the importance of continued conversation with family and the medical providers regarding overall plan of care and treatment options, ensuring decisions are within the context of the patient's values and GOCs.    For now, allow time for outcomes and palliative care will follow.   Patient denies any discomfort - appears comfortable resting in bed.   Questions and concerns were addressed. The family was encouraged to call with questions or concerns.   Primary Decision Maker PATIENT / joined by spouse who is HCPOA    SUMMARY OF RECOMMENDATIONS   - code status changed to DNR - PT eval - spouse interested to see if patient would benefit from rehab though he does not want to lose time with her at rehab if would not provide much benefit - he is hopeful PT can help with this decision - patient likely hospice eligible for extra support if chooses to go home though this was not yet discussed - will follow and allow time for outcomes  Code Status/Advance Care Planning:  DNR  Discharge Planning: To Be Determined      Primary Diagnoses: Present on Admission: . Essential (primary) hypertension . CAD (coronary artery disease) . Thrombocytopenia ([place]) . Acute renal failure superimposed on stage IIIb chronic kidney disease ([place]) . Leukocytosis   I have  reviewed the medical record, interviewed the patient and family, and examined the patient. The following aspects are pertinent.  Past Medical History:  Diagnosis Date  . Allergy   . Anemia    LOW PLATELET COUNTS  ([facility])  . Anxiety   . Carotid artery disease (HCC)    BILAT.        Marland Kitchen Chronic cough   . Cirrhosis, nonalcoholic (HCC)    PORTAL HTN, FATTY TISSUE   . Coronary artery disease   . DDD (degenerative disc disease), cervical    SPONDYLOLISTHESIS  . Depression   . GERD (gastroesophageal reflux disease)   . Headache    HX OCCULAR MIGRAINES  . Hypertension    ESSENTIAL  . Myocardial infarction ([place])   . Shortness of breath dyspnea    WITH EXERTION   . Tricuspid insufficiency    MODERATE    Social History   Socioeconomic History  . Marital status: Married    Spouse name: Sonia Side  . Number of children: 2  . Years of education: Not on file  . Highest education level: Some college, no degree  Occupational History  . 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 Occupation: retired  Tobacco Use  . Smoking status: Former Smoker    Types: Cigarettes  . Smokeless tobacco: Never Used  . Tobacco comment: formerly tried smoking cigarettes, no regular use  Vaping Use  . Vaping Use: Never used  Substance and Sexual Activity  . Alcohol use: No  . Drug use: No  . Sexual activity: Yes    Partners: Male    Birth control/protection: Post-menopausal, Surgical  Other Topics Concern  . Not on file  Social History Narrative   One child was stillborn, and one son died of liver cancer. Pt has one living biological child, and 2 stepchildren.   Social Determinants of Health   Financial Resource Strain: Not on file  Food Insecurity: Not on file  Transportation Needs: Not on file  Physical Activity: Not on file  Stress: Not on file  Social Connections: Not on file   Family History  Problem Relation Age of Onset  . Liver cancer Mother   . Lymphoma Father    . COPD Father   . COPD Brother   . Liver cancer Son   . Breast cancer Neg Hx   . Colon cancer Neg Hx    Scheduled Meds: . Chlorhexidine Gluconate Cloth  6 each Topical Daily  . lactulose  20 g Oral BID  . midodrine  5 mg Oral TID WC  . rifaximin  550 mg Oral BID  . sodium chloride flush  10-40 mL Intracatheter Q12H   Continuous Infusions: . albumin human    . azithromycin    . cefTRIAXone (ROCEPHIN)  IV     PRN Meds:.sodium chloride flush Allergies  Allergen Reactions  . Contrast Media  [Iodinated Diagnostic Agents] Rash    Blister rash   . Azelastine Swelling  . Flonase [Fluticasone Propionate] Swelling  . Prednisone Swelling  . Shellfish Allergy Swelling and Other (See Comments)    Per spouse, determined on allergy test.  . Tramadol Nausea Only   Review  of Systems  Unable to perform ROS: Mental status change  ROS limited by drowsiness, some confusion - denies discomfort/pain  Physical Exam Constitutional:      General: She is not in acute distress.    Comments: Drowsy but responsive, nods head appropraitely  Eyes:     General: Scleral icterus present.  Pulmonary:     Effort: Pulmonary effort is normal.  Skin:    General: Skin is warm and dry.     Coloration: Skin is jaundiced.  Neurological:     Comments: Mild confusion     Vital Signs: BP (!) 102/46 (BP Location: Left Arm)   Pulse 66   Temp 97.7 F (36.5 C) (Oral)   Resp 16   Ht _0  (1.549 m)   Wt 61.6 kg   SpO2 96%   BMI 25.66 kg/m  Pain Scale: 0-10   Pain Score: 0-No pain   SpO2: SpO2: 96 % O2 Device:SpO2: 96 % O2 Flow Rate: .   IO: Intake/output summary:   Intake/Output Summary (Last 24 hours) at 04/27/2020 1344 Last data filed at 04/27/2020 1275 Gross per 24 hour  Intake 511.89 ml  Output 350 ml  Net 161.89 ml    LBM: Last BM Date: 04/26/20 Baseline Weight: Weight: 60.8 kg Most recent weight: Weight: 61.6 kg     Palliative Assessment/Data: PPS 40%    Time Total: 45  minutes Greater than 50%  of this time was spent counseling and coordinating care related to the above assessment and plan.  Juel Burrow, DNP, AGNP-C Palliative Medicine Team [phone] Pager: [phone]

## 2020-04-27 NOTE — Progress Notes (Signed)
PROGRESS NOTE    Shannon Sawyer  EVO:[id number] DOB: [birth year] DOA: 04/26/2020 PCP: Gladstone Lighter, MD   Brief Narrative: Taken from H&P. Shannon Sawyer is a 75 y.o. female with medical history significant for CAD, HTN, depression, CKD IIIb, nonalcoholic liver cirrhosis, not a liver transplant candidate, who was recently referred to palliative care who was sent in by GI for evaluation of altered mental status and concern for labs. Patient is having worsening renal function and her home dose of Levaquin which she was taking for SBP prophylaxis was held. Patient with worsening liver functions, hyperbilirubinemia, hyponatremia, thrombocytopenia, leukocytosis which seems chronic and anemia. Ultrasound with mild ascites.  Procalcitonin elevated at 0.88 Started on ceftriaxone for concern of SBP. Meld score of 33 and class C child Pugh score, mortality of 52.6% and next 3 months.  Subjective: Patient was sleeping comfortably when seen today.  Easily arousable but remained little somnolent.  Alert and oriented x3.  Denies any abdominal pain.  No nausea or vomiting.  No appetite.  No cough or congestion.  No shortness of breath.  Assessment & Plan:   Principal Problem:   Decompensated hepatic cirrhosis, non-alcoholic (HCC) Active Problems:   Essential (primary) hypertension   Thrombocytopenia (HCC)   CAD (coronary artery disease)   Acute renal failure superimposed on stage IIIb chronic kidney disease (HCC)   Acute on chronic hyponatremia   Leukocytosis   CAP (community acquired pneumonia)  Decompensated nonalcoholic hepatic cirrhosis. With class C liver cirrhosis and meld score of 33 which makes her with very increased risk of mortality, or 52.6% in the next 68-month Worsening liver functions and failure, elevated T bili, hypoalbuminemia, hyponatremia and worsening thrombocytopenia secondary to liver disease. Patient also developed hepatorenal syndrome with worsening renal function. Exam  positive for flapping tremors and marked scleral icterus, no significant ascites at this time. GI was on board and wants to involve nephrology. Patient was on Levaquin for SBP prophylaxis which was discontinued recently due to worsening renal function. She was started on ceftriaxone and Zithromax, chest x-ray with infiltrate/atelectasis, most likely atelectasis as there is no significant upper respiratory symptoms.  Patient does has leukocytosis and elevated procalcitonin.  Leukocytosis seems chronic. Not a candidate for liver transplant. -Continue with lactulose and rifaximin. -Continue with ceftriaxone -Continue with supportive care. -Palliative care consult-patient is very high risk for deterioration and death.  Leukocytosis.  Seems chronic per chart review.  Procalcitonin elevated at 0.88 but that is unreliable with CKD. Chest x-ray with some concern of infiltrate/atelectasis but no upper respiratory symptoms, less likely pneumonia.  UA with some leukocytes. She was started on Zithromax and ceftriaxone for a possible CAP. -Discontinue Zithromax -Continue with ceftriaxone as it will cover for any SBP. -Obtain blood cultures.   -Check urine culture as add-on as patient is already on antibiotics. -Can be an underlying hematologic abnormality secondary to liver disease.  Acute on chronic hyponatremia.  Most likely secondary to liver disease.  Urinary sodium less than 10, elevated serum osmolality at 324. Baseline sodium around 126-130.  Patient did received some IV fluid. Patient was on Zoloft and Lasix at home which can be contributory. Nephrology was consulted at the request of GI. -Discontinue IV fluid -Keep holding Zoloft and Lasix -Monitor sodium  Thrombocytopenia.  Worsening thrombocytopenia secondary to liver disease. Platelets at 37 today.  But all cell lines were decreased as she received some fluid.  No obvious bleeding, multiple bruising involving extremities. -Continue to  monitor.  Anemia.  Hemoglobin at  7.4 today, no obvious bleeding and stool occult card was negative.  All cell lines decreased. Most likely secondary to liver and kidney disease. -Continue to monitor -Transfuse if below 7  AKI with CKD stage IIIb.  Creatinine with minor improvement but worsening azotemia.  Most likely secondary to hepatorenal syndrome and poor p.o. intake. -Nephrology was consulted-we will appreciate their recommendations. Patient did received some IV fluid for concern of azotemia. -Discontinue IV fluid per nephrology recommendations. -Continue with midodrine to avoid hypotension. -Monitor renal function -Avoid nephrotoxins  Hypertension.  Blood pressure currently soft. -Patient is on midodrine. -Keep holding home antihypertensives  CAD.  No chest pain and EKG without any acute changes. -Continue aspirin. -Holding propranolol due to softer blood pressure, can be resumed if blood pressure improves.   Objective: Vitals:   04/27/20 0027 04/27/20 0530 04/27/20 0806 04/27/20 1115  BP: (!) 109/42 (!) 134/57 (!) 102/45 (!) 102/46  Pulse: 82 82 62 66  Resp: 19 (!) 21 16 16   Temp: 97.6 F (36.4 C) (!) 97.5 F (36.4 C) 97.8 F (36.6 C) 97.7 F (36.5 C)  TempSrc: Oral Oral Oral Oral  SpO2: 95% 92% 97% 96%  Weight: 61.6 kg     Height: 5' 1"  (1.549 m)       Intake/Output Summary (Last 24 hours) at 04/27/2020 1340 Last data filed at 04/27/2020 9622 Gross per 24 hour  Intake 511.89 ml  Output 350 ml  Net 161.89 ml   Filed Weights   04/26/20 1547 04/27/20 0027  Weight: 60.8 kg 61.6 kg    Examination:  General exam: Chronically ill-appearing, lethargic lady, appears calm and comfortable, marked scleral icterus and flapping tremors Respiratory system: Clear to auscultation. Respiratory effort normal. Cardiovascular system: S1 & S2 heard, RRR.  Gastrointestinal system: Soft, nontender, nondistended, bowel sounds positive. Central nervous system: Alert and  oriented. No focal neurological deficits. Extremities: No edema, no cyanosis, pulses intact and symmetrical. Skin: Multiple ecchymosis involving hands and lower arm. Psychiatry: Judgement and insight appear normal.    DVT prophylaxis: SCDs Code Status: DNR Family Communication: Husband was updated at bedside. Disposition Plan:  Status is: Inpatient  Remains inpatient appropriate because:Inpatient level of care appropriate due to severity of illness   Dispo: The patient is from: Home              Anticipated d/c is to: To be determined              Anticipated d/c date is: 2 days              Patient currently is not medically stable to d/c.   Difficult to place patient No              Level of care: Progressive Cardiac  All the records are reviewed and case discussed with Care Management/Social Worker. Management plans discussed with the patient, nursing and they are in agreement.  Consultants:   GI  Nephrology  Palliative care  Procedures:  Antimicrobials:  Ceftriaxone  Data Reviewed: I have personally reviewed following labs and imaging studies  CBC: Recent Labs  Lab 04/25/20 1401 04/26/20 1601 04/27/20 0415  WBC 14.8* 16.4* 15.8*  NEUTROABS  --   --  13.5*  HGB 8.9* 8.0* 7.4*  HCT 24.3* 22.8* 20.8*  MCV 104.3* 105.6* 105.6*  PLT 45* 45* 37*   Basic Metabolic Panel: Recent Labs  Lab 04/25/20 1401 04/26/20 1601 04/27/20 0415  NA 126* 122* 124*  124*  K 4.0 4.0 4.1  CL 97* 93* 96*  CO2 16* 17* 18*  GLUCOSE 130* 130* 97  BUN 152* 157* 159*  CREATININE 1.94* 2.12* 2.07*  CALCIUM 9.1 8.8* 8.2*  MG 2.5*  --   --   PHOS 4.9*  --   --    GFR: Estimated Creatinine Clearance: 20.1 mL/min (A) (by C-G formula based on SCr of 2.07 mg/dL (H)). Liver Function Tests: Recent Labs  Lab 04/25/20 1401 04/26/20 1601 04/27/20 0415  AST 110* 106* 92*  ALT 41 39 36  ALKPHOS 110 116 96  BILITOT 7.0* 8.0* 7.6*  PROT 7.3 7.2 6.7  ALBUMIN 3.7 3.6 3.3*   No  results for input(s): LIPASE, AMYLASE in the last 168 hours. Recent Labs  Lab 04/26/20 2054  AMMONIA 44*   Coagulation Profile: Recent Labs  Lab 04/27/20 0909  INR 2.1*   Cardiac Enzymes: No results for input(s): CKTOTAL, CKMB, CKMBINDEX, TROPONINI in the last 168 hours. BNP (last 3 results) No results for input(s): PROBNP in the last 8760 hours. HbA1C: No results for input(s): HGBA1C in the last 72 hours. CBG: No results for input(s): GLUCAP in the last 168 hours. Lipid Profile: No results for input(s): CHOL, HDL, LDLCALC, TRIG, CHOLHDL, LDLDIRECT in the last 72 hours. Thyroid Function Tests: No results for input(s): TSH, T4TOTAL, FREET4, T3FREE, THYROIDAB in the last 72 hours. Anemia Panel: No results for input(s): VITAMINB12, FOLATE, FERRITIN, TIBC, IRON, RETICCTPCT in the last 72 hours. Sepsis Labs: Recent Labs  Lab 04/26/20 2054 04/27/20 0415  PROCALCITON  --  0.88  LATICACIDVEN 1.7  --     Recent Results (from the past 240 hour(s))  Resp Panel by RT-PCR (Flu A&B, Covid) Nasopharyngeal Swab     Status: None   Collection Time: 04/26/20  8:54 PM   Specimen: Nasopharyngeal Swab; Nasopharyngeal(NP) swabs in vial transport medium  Result Value Ref Range Status   SARS Coronavirus 2 by RT PCR NEGATIVE NEGATIVE Final    Comment: (NOTE) SARS-CoV-2 target nucleic acids are NOT DETECTED.  The SARS-CoV-2 RNA is generally detectable in upper respiratory specimens during the acute phase of infection. The lowest concentration of SARS-CoV-2 viral copies this assay can detect is 138 copies/mL. A negative result does not preclude SARS-Cov-2 infection and should not be used as the sole basis for treatment or other patient management decisions. A negative result may occur with  improper specimen collection/handling, submission of specimen other than nasopharyngeal swab, presence of viral mutation(s) within the areas targeted by this assay, and inadequate number of  viral copies(<138 copies/mL). A negative result must be combined with clinical observations, patient history, and epidemiological information. The expected result is Negative.  Fact Sheet for Patients:  EntrepreneurPulse.com.au  Fact Sheet for Healthcare Providers:  IncredibleEmployment.be  This test is no t yet approved or cleared by the [place] FDA and  has been authorized for detection and/or diagnosis of SARS-CoV-2 by FDA under an Emergency Use Authorization (EUA). This EUA will remain  in effect (meaning this test can be used) for the duration of the COVID-19 declaration under Section 564(b)(1) of the Act, 21 U.S.C.section 360bbb-3(b)(1), unless the authorization is terminated  or revoked sooner.       Influenza A by PCR NEGATIVE NEGATIVE Final   Influenza B by PCR NEGATIVE NEGATIVE Final    Comment: (NOTE) The Xpert Xpress SARS-CoV-2/FLU/RSV plus assay is intended as an aid in the diagnosis of influenza from Nasopharyngeal swab specimens and should not be used as a sole basis for treatment.  Nasal washings and aspirates are unacceptable for Xpert Xpress SARS-CoV-2/FLU/RSV testing.  Fact Sheet for Patients: EntrepreneurPulse.com.au  Fact Sheet for Healthcare Providers: IncredibleEmployment.be  This test is not yet approved or cleared by the [place] FDA and has been authorized for detection and/or diagnosis of SARS-CoV-2 by FDA under an Emergency Use Authorization (EUA). This EUA will remain in effect (meaning this test can be used) for the duration of the COVID-19 declaration under Section 564(b)(1) of the Act, 21 U.S.C. section 360bbb-3(b)(1), unless the authorization is terminated or revoked.  Performed at [facility], [address] [place], [place] [address]      Radiology Studies: DG Chest Portable 1 View  Result Date: 04/26/2020 CLINICAL DATA:  75 year old female  with shortness of breath. EXAM: PORTABLE CHEST 1 VIEW COMPARISON:  Chest radiograph dated 05/29/2019. FINDINGS: Right-sided Port-A-Cath with tip at the cavoatrial junction. Left lung base opacity, likely combination of pleural effusion and associated atelectasis or infiltrate. There is diffuse vascular and interstitial prominence consistent with congestion and/or edema. No pneumothorax. Atherosclerotic calcification of the aortic arch. No acute osseous pathology. Lower cervical ACDF. IMPRESSION: Left-sided pleural effusion and associated atelectasis/infiltrate. Electronically Signed   By: Anner Crete M.D.   On: 04/26/2020 21:24    Scheduled Meds: . Chlorhexidine Gluconate Cloth  6 each Topical Daily  . lactulose  20 g Oral BID  . midodrine  5 mg Oral TID WC  . rifaximin  550 mg Oral BID  . sodium chloride flush  10-40 mL Intracatheter Q12H   Continuous Infusions: . albumin human    . azithromycin    . cefTRIAXone (ROCEPHIN)  IV       LOS: 1 day   Time spent: 45 minutes. More than 50% of the time was spent in counseling/coordination of care  Lorella Nimrod, MD [facility]  If 7PM-7AM, please contact night-coverage [email or web address]  04/27/2020, 1:40 PM   This record has been created using Systems analyst. Errors have been sought and corrected,but may not always be located. Such creation errors do not reflect on the standard of care.

## 2020-04-28 DIAGNOSIS — Z7189 Other specified counseling: Secondary | ICD-10-CM | POA: Diagnosis not present

## 2020-04-28 DIAGNOSIS — K7290 Hepatic failure, unspecified without coma: Secondary | ICD-10-CM | POA: Diagnosis not present

## 2020-04-28 DIAGNOSIS — Z66 Do not resuscitate: Secondary | ICD-10-CM

## 2020-04-28 DIAGNOSIS — Z515 Encounter for palliative care: Secondary | ICD-10-CM

## 2020-04-28 DIAGNOSIS — K7460 Unspecified cirrhosis of liver: Secondary | ICD-10-CM | POA: Diagnosis not present

## 2020-04-28 LAB — VITAMIN B12: Vitamin B-12: 1495 pg/mL — ABNORMAL HIGH (ref 180–914)

## 2020-04-28 LAB — CBC
HCT: 20.5 % — ABNORMAL LOW (ref 36.0–46.0)
Hemoglobin: 7.6 g/dL — ABNORMAL LOW (ref 12.0–15.0)
MCH: 38 pg — ABNORMAL HIGH (ref 26.0–34.0)
MCHC: 37.1 g/dL — ABNORMAL HIGH (ref 30.0–36.0)
MCV: 102.5 fL — ABNORMAL HIGH (ref 80.0–100.0)
Platelets: 45 10*3/uL — ABNORMAL LOW (ref 150–400)
RBC: 2 MIL/uL — ABNORMAL LOW (ref 3.87–5.11)
RDW: 17.3 % — ABNORMAL HIGH (ref 11.5–15.5)
WBC: 15.3 10*3/uL — ABNORMAL HIGH (ref 4.0–10.5)
nRBC: 0 % (ref 0.0–0.2)

## 2020-04-28 LAB — RETICULOCYTES
Immature Retic Fract: 27.7 % — ABNORMAL HIGH (ref 2.3–15.9)
RBC.: 1.97 MIL/uL — ABNORMAL LOW (ref 3.87–5.11)
Retic Count, Absolute: 113.1 10*3/uL (ref 19.0–186.0)
Retic Ct Pct: 5.7 % — ABNORMAL HIGH (ref 0.4–3.1)

## 2020-04-28 LAB — COMPREHENSIVE METABOLIC PANEL
ALT: 33 U/L (ref 0–44)
AST: 88 U/L — ABNORMAL HIGH (ref 15–41)
Albumin: 4 g/dL (ref 3.5–5.0)
Alkaline Phosphatase: 77 U/L (ref 38–126)
Anion gap: 12 (ref 5–15)
BUN: 162 mg/dL — ABNORMAL HIGH (ref 8–23)
CO2: 18 mmol/L — ABNORMAL LOW (ref 22–32)
Calcium: 8.7 mg/dL — ABNORMAL LOW (ref 8.9–10.3)
Chloride: 99 mmol/L (ref 98–111)
Creatinine, Ser: 2.25 mg/dL — ABNORMAL HIGH (ref 0.44–1.00)
GFR, Estimated: 22 mL/min — ABNORMAL LOW (ref >60)
Glucose, Bld: 103 mg/dL — ABNORMAL HIGH (ref 70–99)
Potassium: 3.8 mmol/L (ref 3.5–5.1)
Sodium: 129 mmol/L — ABNORMAL LOW (ref 135–145)
Total Bilirubin: 7.2 mg/dL — ABNORMAL HIGH (ref 0.3–1.2)
Total Protein: 7.2 g/dL (ref 6.5–8.1)

## 2020-04-28 LAB — FERRITIN: Ferritin: 194 ng/mL (ref 11–307)

## 2020-04-28 LAB — IRON AND TIBC
Iron: 29 ug/dL (ref 28–170)
Saturation Ratios: 13 % (ref 10.4–31.8)
TIBC: 227 ug/dL — ABNORMAL LOW (ref 250–450)
UIBC: 198 ug/dL

## 2020-04-28 LAB — FOLATE: Folate: 39 ng/mL (ref >5.9)

## 2020-04-28 MED ORDER — OCTREOTIDE ACETATE 100 MCG/ML IJ SOLN
100.0000 ug | Freq: Three times a day (TID) | INTRAMUSCULAR | Status: DC
  Administered 2020-04-28 – 2020-05-02 (×9): 100 ug via SUBCUTANEOUS
  Filled 2020-04-28 (×17): qty 1

## 2020-04-28 MED ORDER — MIDODRINE HCL 5 MG PO TABS
7.5000 mg | ORAL_TABLET | Freq: Three times a day (TID) | ORAL | Status: DC
  Administered 2020-04-28 – 2020-05-01 (×10): 7.5 mg via ORAL
  Filled 2020-04-28 (×11): qty 2

## 2020-04-28 NOTE — Evaluation (Signed)
Occupational Therapy Evaluation Patient Details Name: Shannon Sawyer MRN: [id number] DOB: [birth year] Today's Date: 04/28/2020    History of Present Illness Pt is 75 y/o F with who was admitted for hepatic cirrhosis with complaints of AMS. History includes CAD, HTN, depression, and CKD.   Clinical Impression   Pt seen for OT evaluation this date in setting of acute hospitalization d/t altered mental status and abdominal fluid collection. Pt reports that at least 2-3  Months ago, she was able to do more for herself including transferring and dressing herself, but does state she's gotten progressively weak. Pt states that she lives with her spouse and he has assisted her with bathing, dressing, transferring and walking household distances with RW in recent weeks/months d/t pt feeling weak. Pt states that she was able to complete short HH distances such as from bed to restroom herself with RW ~2 weeks ago, but states that he spouse has had to help steady her lately. Pt present this date with gross weakness of trunk and limbs as well as decreased fxl activity tolerance. Pt requires MIN A for lateral rolling in bed and declines OOB activity with OT on assessment this date. Pt requires SETUP to MIN A With bed level UB ADLs such as self-feeding/drinking (requires encouragement to do as much herself as possible) and requires MAX A with bed level LB ADLs 2/2 and discomfort. Will continue to follow pt acutely, anticipate she will require HHOT upon f/u as well as intermittent SUPV for safety with fxl mobility. While pt could benefit from rehab stay, she is not particularly motivated to improve, but just wants to maintain her strength to reduce caregiver burden.     Follow Up Recommendations  Home health OT    Equipment Recommendations  Other (comment) (pt requires new wheelchair. pt reports having all other necessary equipment)    Recommendations for Other Services       Precautions / Restrictions  Precautions Precautions: Fall Restrictions Weight Bearing Restrictions: No      Mobility Bed Mobility Overal bed mobility: Needs Assistance Bed Mobility: Rolling Rolling: Min assist         General bed mobility comments: use of bed rails, MIN cues to sequence.    Transfers           General transfer comment: pt declines to get OOB for OT on assessment citing fatigue.    Balance               ADL either performed or assessed with clinical judgement   ADL              General ADL Comments: pt requires SETUP to MIN A with bed level UB ADLs including self-feeding and washing face. Pt requires MAX A for bed level LB ADLs d/t abd discomfort.     Vision Baseline Vision/History: Wears glasses Patient Visual Report: No change from baseline       Perception     Praxis      Pertinent Vitals/Pain Pain Assessment: Faces Faces Pain Scale: Hurts a little bit Pain Location: bottom is a little sore with rolling in bed Pain Descriptors / Indicators: Sore Pain Intervention(s): Monitored during session;Repositioned     Hand Dominance     Extremity/Trunk Assessment Upper Extremity Assessment Upper Extremity Assessment: Generalized weakness (shld/elbow 3/5, MMT 3+/5)   Lower Extremity Assessment Lower Extremity Assessment: Generalized weakness       Communication Communication Communication: No difficulties   Cognition Arousal/Alertness: Awake/alert Behavior During Therapy:  WFL for tasks assessed/performed;Flat affect Overall Cognitive Status: Within Functional Limits for tasks assessed                                 General Comments: pt appropriate with one step commands and she is basically oriented to place, time, self. Pt not oriented to day/date, not oriented to situation, but does report that her abdomen is uncomfortable.   General Comments       Exercises Other Exercises Other Exercises: OT educates re: role of OT in acute  setting, importance of protein to maintain strength, importance of OOB activity to maintain fxl tolerance. Pt with moderate reception/attention, but somewhat flat, difficult to guage pt's caryover capacity.   Shoulder Instructions      Home Living Family/patient expects to be discharged to:: Private residence Living Arrangements: Spouse/significant other Available Help at Discharge: Family;Available 24 hours/day Type of Home: House Home Access: Ramped entrance     Home Layout: One level     Bathroom Shower/Tub: Occupational psychologist: Standard Bathroom Accessibility: No   Home Equipment: Environmental consultant - 2 wheels;Walker - 4 wheels;Cane - single point;Bedside commode;Hospital bed          Prior Functioning/Environment Level of Independence: Needs assistance  Gait / Transfers Assistance Needed: states she uses RW and that most recently, her husband has even had to help her with [place] amb longer distances like to the living room. ADL's / Homemaking Assistance Needed: Pt states that for ~2-3 months that pt has  requires increasing self care assist from her spouse including bathing and dressing.            OT Problem List: Decreased strength;Decreased range of motion;Decreased activity tolerance;Impaired balance (sitting and/or standing);Decreased coordination;Decreased cognition;Decreased knowledge of use of DME or AE;Pain      OT Treatment/Interventions: Self-care/ADL training;DME and/or AE instruction;Therapeutic activities;Balance training;Therapeutic exercise;Energy conservation;Patient/family education    OT Goals(Current goals can be found in the care plan section) Acute Rehab OT Goals Patient Stated Goal: pt not super motivated with goal setting, does finally endorse wanting to at least maintain strength to make her care easier on her spouse. OT Goal Formulation: With patient Time For Goal Achievement: 05/13/20 Potential to Achieve Goals: Fair ADL Goals Pt Will Perform  Upper Body Dressing: with set-up;sitting Pt Will Transfer to Toilet: with min assist;ambulating;bedside commode (with LRAD ~10') Pt Will Perform Toileting - Clothing Manipulation and hygiene: with min assist;sitting/lateral leans Pt Will Perform Tub/Shower Transfer: with min assist;Stand pivot transfer;shower seat  OT Frequency: Min 1X/week   Barriers to D/C:            Co-evaluation              AM-PAC OT "6 Clicks" Daily Activity     Outcome Measure Help from another person eating meals?: None Help from another person taking care of personal grooming?: A Little Help from another person toileting, which includes using toliet, bedpan, or urinal?: A Lot Help from another person bathing (including washing, rinsing, drying)?: A Lot Help from another person to put on and taking off regular upper body clothing?: A Little Help from another person to put on and taking off regular lower body clothing?: A Lot 6 Click Score: 16   End of Session Nurse Communication: Mobility status;Other (comment) (CNA present in room, pt reports fatigue and declines getting bath with CNA)  Activity Tolerance: Patient limited by fatigue Patient  left: in bed;with call bell/phone within reach;with bed alarm set  OT Visit Diagnosis: Unsteadiness on feet (R26.81);Muscle weakness (generalized) (M62.81)                Time: 6734-1937 OT Time Calculation (min): 54 min Charges:  OT General Charges $OT Visit: 1 Visit OT Evaluation $OT Eval Moderate Complexity: 1 Mod OT Treatments $Self Care/Home Management : 23-37 mins $Therapeutic Activity: 23-37 mins  Gerrianne Scale, MS, OTR/L ascom [phone] 04/28/20, 2:11 PM

## 2020-04-28 NOTE — Progress Notes (Addendum)
Pt appetite very poor, refusing ensures. Will take meds in applesauce. Will continue to encourage PO intake. MD made aware via secure chat. No new orders at this time.

## 2020-04-28 NOTE — Progress Notes (Signed)
[place], [place] 04/28/20  Subjective:   LOS: 2 Shannon Sawyer is a 75 y.o. female with PMHX anemia, carotid artery disease, cirrhosis not a liver transplant candidate, depression, GERD, essential hypertension, shortness of breath, myocardial infarction, and tricuspid insufficiency.  She is currently admitted to the hospital with acute kidney injury on top of chronic kidney disease with hyponatremia.   Patient seen resting in bed with husband at the bedside Husband states she has a poor appetite and this is normal Denies nausea Denies shortness of breath Husband says she uses walker at home to get around and can manage some without the walker They were instructed she have 3-5 BM's per day for liver condition He says this increased over the past 2 weeks and he felt she was "going more than what she was taking in."  Currently on room air UOP 328ml  Objective:  Vital signs in last 24 hours:  Temp:  [97.6 F (36.4 C)-98.1 F (36.7 C)] 97.7 F (36.5 C) (02/24 1235) Pulse Rate:  [65-77] 73 (02/24 1235) Resp:  [16-18] 18 (02/24 1235) BP: (107-117)/(47-56) 117/56 (02/24 1235) SpO2:  [92 %-100 %] 100 % (02/24 1235) Weight:  [62.5 kg] 62.5 kg (02/24 0539)  Weight change: 1.718 kg Filed Weights   04/26/20 1547 04/27/20 0027 04/28/20 0539  Weight: 60.8 kg 61.6 kg 62.5 kg    Intake/Output:    Intake/Output Summary (Last 24 hours) at 04/28/2020 1449 Last data filed at 04/28/2020 1337 Gross per 24 hour  Intake 937.96 ml  Output 200 ml  Net 737.96 ml    Physical Exam: General:  NAD, resting comfortable  HEENT  within normal limits  Neck:  Neck supple  Lungs:  Decreased breath sounds bilaterally  Heart::  regular rate and rhythm  Abdomen:  Distended with ascites, firm  Extremities:  Trace edema  Neurologic:  Awake and alert oriented x3  Skin:  Skin intact     Basic Metabolic Panel:  Recent Labs  Lab 04/25/20 1401 04/26/20 1601  04/27/20 0415 04/28/20 0545  NA 126* 122* 124*  124* 129*  K 4.0 4.0 4.1 3.8  CL 97* 93* 96* 99  CO2 16* 17* 18* 18*  GLUCOSE 130* 130* 97 103*  BUN 152* 157* 159* 162*  CREATININE 1.94* 2.12* 2.07* 2.25*  CALCIUM 9.1 8.8* 8.2* 8.7*  MG 2.5*  --   --   --   PHOS 4.9*  --   --   --      CBC: Recent Labs  Lab 04/25/20 1401 04/26/20 1601 04/27/20 0415 04/28/20 0545  WBC 14.8* 16.4* 15.8* 15.3*  NEUTROABS  --   --  13.5*  --   HGB 8.9* 8.0* 7.4* 7.6*  HCT 24.3* 22.8* 20.8* 20.5*  MCV 104.3* 105.6* 105.6* 102.5*  PLT 45* 45* 37* 45*     No results found for: HEPBSAG, HEPBSAB, HEPBIGM    Microbiology:  Recent Results (from the past 240 hour(s))  Resp Panel by RT-PCR (Flu A&B, Covid) Nasopharyngeal Swab     Status: None   Collection Time: 04/26/20  8:54 PM   Specimen: Nasopharyngeal Swab; Nasopharyngeal(NP) swabs in vial transport medium  Result Value Ref Range Status   SARS Coronavirus 2 by RT PCR NEGATIVE NEGATIVE Final    Comment: (NOTE) SARS-CoV-2 target nucleic acids are NOT DETECTED.  The SARS-CoV-2 RNA is generally detectable in upper respiratory specimens during the acute phase of infection. The lowest concentration of SARS-CoV-2 viral copies this assay can detect  is 138 copies/mL. A negative result does not preclude SARS-Cov-2 infection and should not be used as the sole basis for treatment or other patient management decisions. A negative result may occur with  improper specimen collection/handling, submission of specimen other than nasopharyngeal swab, presence of viral mutation(s) within the areas targeted by this assay, and inadequate number of viral copies(<138 copies/mL). A negative result must be combined with clinical observations, patient history, and epidemiological information. The expected result is Negative.  Fact Sheet for Patients:  EntrepreneurPulse.com.au  Fact Sheet for Healthcare Providers:   IncredibleEmployment.be  This test is no t yet approved or cleared by the [place] FDA and  has been authorized for detection and/or diagnosis of SARS-CoV-2 by FDA under an Emergency Use Authorization (EUA). This EUA will remain  in effect (meaning this test can be used) for the duration of the COVID-19 declaration under Section 564(b)(1) of the Act, 21 U.S.C.section 360bbb-3(b)(1), unless the authorization is terminated  or revoked sooner.       Influenza A by PCR NEGATIVE NEGATIVE Final   Influenza B by PCR NEGATIVE NEGATIVE Final    Comment: (NOTE) The Xpert Xpress SARS-CoV-2/FLU/RSV plus assay is intended as an aid in the diagnosis of influenza from Nasopharyngeal swab specimens and should not be used as a sole basis for treatment. Nasal washings and aspirates are unacceptable for Xpert Xpress SARS-CoV-2/FLU/RSV testing.  Fact Sheet for Patients: EntrepreneurPulse.com.au  Fact Sheet for Healthcare Providers: IncredibleEmployment.be  This test is not yet approved or cleared by the [place] FDA and has been authorized for detection and/or diagnosis of SARS-CoV-2 by FDA under an Emergency Use Authorization (EUA). This EUA will remain in effect (meaning this test can be used) for the duration of the COVID-19 declaration under Section 564(b)(1) of the Act, 21 U.S.C. section 360bbb-3(b)(1), unless the authorization is terminated or revoked.  Performed at [facility], [address] [place], [place] [address]   CULTURE, BLOOD (ROUTINE X 2) w Reflex to ID Panel     Status: None (Preliminary result)   Collection Time: 04/27/20  1:10 PM   Specimen: BLOOD  Result Value Ref Range Status   Specimen Description BLOOD LEFT ANTECUBITAL  Final   Special Requests   Final    BOTTLES DRAWN AEROBIC AND ANAEROBIC Blood Culture adequate volume   Culture   Final    NO GROWTH < 24 HOURS Performed at [facility], [address], [place], [place] [address]    Report Status PENDING  Incomplete  CULTURE, BLOOD (ROUTINE X 2) w Reflex to ID Panel     Status: None (Preliminary result)   Collection Time: 04/27/20  1:11 PM   Specimen: BLOOD  Result Value Ref Range Status   Specimen Description BLOOD RIGHT ANTECUBITAL  Final   Special Requests   Final    BOTTLES DRAWN AEROBIC AND ANAEROBIC Blood Culture results may not be optimal due to an excessive volume of blood received in culture bottles   Culture   Final    NO GROWTH < 24 HOURS Performed at [facility], [address], [place], [place] [address]    Report Status PENDING  Incomplete    Coagulation Studies: Recent Labs    04/27/20 0909  LABPROT 23.1*  INR 2.1*    Urinalysis: Recent Labs    04/26/20 1601  COLORURINE YELLOW*  LABSPEC 1.012  PHURINE 5.0  GLUCOSEU NEGATIVE  HGBUR NEGATIVE  BILIRUBINUR NEGATIVE  KETONESUR NEGATIVE  PROTEINUR NEGATIVE  NITRITE NEGATIVE  LEUKOCYTESUR MODERATE*      Imaging: DG Chest Portable 1 View  Result Date: 04/26/2020 CLINICAL DATA:  75 year old female with shortness of breath. EXAM: PORTABLE CHEST 1 VIEW COMPARISON:  Chest radiograph dated 05/29/2019. FINDINGS: Right-sided Port-A-Cath with tip at the cavoatrial junction. Left lung base opacity, likely combination of pleural effusion and associated atelectasis or infiltrate. There is diffuse vascular and interstitial prominence consistent with congestion and/or edema. No pneumothorax. Atherosclerotic calcification of the aortic arch. No acute osseous pathology. Lower cervical ACDF. IMPRESSION: Left-sided pleural effusion and associated atelectasis/infiltrate. Electronically Signed   By: Anner Crete M.D.   On: 04/26/2020 21:24     Medications:   . albumin human 50 g (04/28/20 1208)  . cefTRIAXone (ROCEPHIN)  IV 2 g (04/27/20 2244)   . Chlorhexidine Gluconate Cloth  6 each Topical Daily  . feeding supplement  237 mL  Oral TID BM  . lactulose  20 g Oral BID  . midodrine  5 mg Oral TID WC  . multivitamin with minerals  1 tablet Oral Daily  . rifaximin  550 mg Oral BID  . sodium chloride flush  10-40 mL Intracatheter Q12H   sodium chloride flush  Assessment/ Plan:  75 y.o. female with PMHX chronic kidney disease stage 4, anemia, carotid artery disease, cirrhosis not a liver transplant candidate, depression, GERD, essential hypertension, shortness of breath, myocardial infarction, and tricuspid insufficiency. was admitted on 04/26/2020 for  Principal Problem:   Decompensated hepatic cirrhosis, non-alcoholic (HCC) Active Problems:   Essential (primary) hypertension   Thrombocytopenia (HCC)   CAD (coronary artery disease)   Acute renal failure superimposed on stage IIIb chronic kidney disease (HCC)   Acute on chronic hyponatremia   Leukocytosis   CAP (community acquired pneumonia)  Hyponatremia [E87.1] Pleural effusion [J90] Elevated bilirubin [R17] Decompensated hepatic cirrhosis ([place]) [K72.90, K74.60]  #. Hyponatremia Acute kidney injury on top of chronic kidney disease stage IV (creatinine 1.71/GFR 29) from 06/16/2019 -Likely due to to overdiuresis -Isotonic saline IV fluids given since admission, these fluids are now [place] -Patient placed on a 1 L/day fluid restriction -Improved sodium level 129 -We will continue to monitor  #. Anemia of CKD  Lab Results  Component Value Date   HGB 7.6 (L) 04/28/2020  -Albumin prescribed daily -We will continue to monitor   LOS: [place] 2/24/20222:49 [place], [place]

## 2020-04-28 NOTE — Progress Notes (Signed)
PROGRESS NOTE    Truc B Maulden  XBW:[id number] DOB: 1945/05/16 DOA: 04/26/2020 PCP: Gladstone Lighter, MD   Brief Narrative: Taken from H&P. Starleen B Posada is a 75 y.o. female with medical history significant for CAD, HTN, depression, CKD IIIb, nonalcoholic liver cirrhosis, not a liver transplant candidate, who was recently referred to palliative care who was sent in by GI for evaluation of altered mental status and concern for labs. Patient is having worsening renal function and her home dose of Levaquin which she was taking for SBP prophylaxis was held. Patient with worsening liver functions, hyperbilirubinemia, hyponatremia, thrombocytopenia, leukocytosis which seems chronic and anemia. Ultrasound with mild ascites.  Procalcitonin elevated at 0.88 Started on ceftriaxone for concern of SBP. Meld score of 33 and class C child Pugh score, mortality of 52.6% and next 3 months.  Subjective: Patient continued to have poor appetite.  No nausea or vomiting.  She was more alert and awake today.  Assessment & Plan:   Principal Problem:   Decompensated hepatic cirrhosis, non-alcoholic (HCC) Active Problems:   Essential (primary) hypertension   Thrombocytopenia (HCC)   CAD (coronary artery disease)   Acute renal failure superimposed on stage IIIb chronic kidney disease (HCC)   Acute on chronic hyponatremia   Leukocytosis   CAP (community acquired pneumonia)  Decompensated nonalcoholic hepatic cirrhosis. With class C liver cirrhosis and meld score of 33 which makes her with very increased risk of mortality, or 52.6% in the next 8-month Worsening liver functions and failure, elevated T bili, hypoalbuminemia, hyponatremia and worsening thrombocytopenia secondary to liver disease. Patient also developed hepatorenal syndrome with worsening renal function. Exam positive for flapping tremors and marked scleral icterus, no significant ascites at this time. GI was on board and wants to involve  nephrology. Patient was on Levaquin for SBP prophylaxis which was discontinued recently due to worsening renal function. She was started on ceftriaxone and Zithromax, chest x-ray with infiltrate/atelectasis, most likely atelectasis as there is no significant upper respiratory symptoms.  Patient does has leukocytosis and elevated procalcitonin.  Leukocytosis seems chronic. Not a candidate for liver transplant. -GI started her on albumin and octreotide. -Midodrine dose was increased to 7.5 mg 3 times daily at GI request. -Continue with lactulose and rifaximin. -Continue with ceftriaxone -Continue with supportive care. -Palliative care consult-patient is very high risk for deterioration and death, ongoing discussion, patient was made DNR.  Leukocytosis.  Seems chronic per chart review.  Procalcitonin elevated at 0.88 but that is unreliable with CKD. Chest x-ray with some concern of infiltrate/atelectasis but no upper respiratory symptoms, less likely pneumonia.  UA with some leukocytes. She was started on Zithromax and ceftriaxone for a possible CAPl, Zithromax was discontinued. -Continue with ceftriaxone as it will cover for any SBP. -Obtain blood cultures-remain negative -Urine cultures pending -Can be an underlying hematologic abnormality secondary to liver disease.  Acute on chronic hyponatremia.  Most likely secondary to liver disease.  Urinary sodium less than 10, elevated serum osmolality at 324. Baseline sodium around 126-130.  Patient did received some IV fluid. Patient was on Zoloft and Lasix at home which can be contributory. Nephrology was consulted at the request of GI. Sodium improved to 129 with fluid restriction today. -Keep holding Zoloft and Lasix -Monitor sodium  Thrombocytopenia.  Most likely secondary to liver disease. Platelets at 45 today.  No obvious bleeding, multiple bruising involving extremities. -Continue to monitor.  Anemia.  Hemoglobin at 7.6 today, no  obvious bleeding and stool occult card was negative.  Anemia panel consistent with anemia of chronic disease, most likely secondary to liver and kidney disease. -Continue to monitor -Transfuse if below 7  AKI with CKD stage IIIb.  Worsening creatinine and BUN.  Most likely secondary to hepatorenal syndrome and poor p.o. intake. -Nephrology was consulted-we will appreciate their recommendations. Patient did received some IV fluid for concern of azotemia. -Continue with midodrine to avoid hypotension, dose increased today. -Monitor renal function -Avoid nephrotoxins  Hypertension.  Blood pressure currently soft. -Patient is on midodrine. -Keep holding home antihypertensives  CAD.  No chest pain and EKG without any acute changes. -Continue aspirin. -Holding propranolol due to softer blood pressure, can be resumed if blood pressure improves.   Objective: Vitals:   04/28/20 0412 04/28/20 0539 04/28/20 0846 04/28/20 1235  BP: (!) 116/56  (!) 116/53 (!) 117/56  Pulse: 77  74 73  Resp: 16  18 18   Temp: 98.1 F (36.7 C)  97.7 F (36.5 C) 97.7 F (36.5 C)  TempSrc: Oral  Oral Oral  SpO2: 96% 92% 95% 100%  Weight:  62.5 kg    Height:        Intake/Output Summary (Last 24 hours) at 04/28/2020 1657 Last data filed at 04/28/2020 1337 Gross per 24 hour  Intake 817.96 ml  Output 200 ml  Net 617.96 ml   Filed Weights   04/26/20 1547 04/27/20 0027 04/28/20 0539  Weight: 60.8 kg 61.6 kg 62.5 kg    Examination:  General.  Chronically ill-appearing lady, in no acute distress. Pulmonary.  Decreased breath sound at bases, normal respiratory effort. CV.  Regular rate and rhythm, no JVD, rub or murmur. Abdomen.  Soft, nontender, nondistended, BS positive. CNS.  Alert and oriented x3.  No focal neurologic deficit. Extremities.  No edema, no cyanosis, pulses intact and symmetrical. Psychiatry.  Judgment and insight appears normal.   DVT prophylaxis: SCDs Code Status: DNR Family  Communication: Discussed with patient Disposition Plan:  Status is: Inpatient  Remains inpatient appropriate because:Inpatient level of care appropriate due to severity of illness   Dispo: The patient is from: Home              Anticipated d/c is to: To be determined              Anticipated d/c date is: 2 days              Patient currently is not medically stable to d/c.   Difficult to place patient No              Level of care: Progressive Cardiac  All the records are reviewed and case discussed with Care Management/Social Worker. Management plans discussed with the patient, nursing and they are in agreement.  Consultants:   GI  Nephrology  Palliative care  Procedures:  Antimicrobials:  Ceftriaxone  Data Reviewed: I have personally reviewed following labs and imaging studies  CBC: Recent Labs  Lab 04/25/20 1401 04/26/20 1601 04/27/20 0415 04/28/20 0545  WBC 14.8* 16.4* 15.8* 15.3*  NEUTROABS  --   --  13.5*  --   HGB 8.9* 8.0* 7.4* 7.6*  HCT 24.3* 22.8* 20.8* 20.5*  MCV 104.3* 105.6* 105.6* 102.5*  PLT 45* 45* 37* 45*   Basic Metabolic Panel: Recent Labs  Lab 04/25/20 1401 04/26/20 1601 04/27/20 0415 04/28/20 0545  NA 126* 122* 124*  124* 129*  K 4.0 4.0 4.1 3.8  CL 97* 93* 96* 99  CO2 16* 17* 18* 18*  GLUCOSE 130* 130*  97 103*  BUN 152* 157* 159* 162*  CREATININE 1.94* 2.12* 2.07* 2.25*  CALCIUM 9.1 8.8* 8.2* 8.7*  MG 2.5*  --   --   --   PHOS 4.9*  --   --   --    GFR: Estimated Creatinine Clearance: 18.6 mL/min (A) (by C-G formula based on SCr of 2.25 mg/dL (H)). Liver Function Tests: Recent Labs  Lab 04/25/20 1401 04/26/20 1601 04/27/20 0415 04/28/20 0545  AST 110* 106* 92* 88*  ALT 41 39 36 33  ALKPHOS 110 116 96 77  BILITOT 7.0* 8.0* 7.6* 7.2*  PROT 7.3 7.2 6.7 7.2  ALBUMIN 3.7 3.6 3.3* 4.0   No results for input(s): LIPASE, AMYLASE in the last 168 hours. Recent Labs  Lab 04/26/20 2054  AMMONIA 44*   Coagulation  Profile: Recent Labs  Lab 04/27/20 0909  INR 2.1*   Cardiac Enzymes: No results for input(s): CKTOTAL, CKMB, CKMBINDEX, TROPONINI in the last 168 hours. BNP (last 3 results) No results for input(s): PROBNP in the last 8760 hours. HbA1C: No results for input(s): HGBA1C in the last 72 hours. CBG: No results for input(s): GLUCAP in the last 168 hours. Lipid Profile: No results for input(s): CHOL, HDL, LDLCALC, TRIG, CHOLHDL, LDLDIRECT in the last 72 hours. Thyroid Function Tests: No results for input(s): TSH, T4TOTAL, FREET4, T3FREE, THYROIDAB in the last 72 hours. Anemia Panel: Recent Labs    04/27/20 0909 04/28/20 0545  VITAMINB12 1,495*  --   FOLATE  --  39.0  FERRITIN  --  194  TIBC  --  227*  IRON  --  29  RETICCTPCT  --  5.7*   Sepsis Labs: Recent Labs  Lab 04/26/20 2054 04/27/20 0415  PROCALCITON  --  0.88  LATICACIDVEN 1.7  --     Recent Results (from the past 240 hour(s))  Resp Panel by RT-PCR (Flu A&B, Covid) Nasopharyngeal Swab     Status: None   Collection Time: 04/26/20  8:54 PM   Specimen: Nasopharyngeal Swab; Nasopharyngeal(NP) swabs in vial transport medium  Result Value Ref Range Status   SARS Coronavirus 2 by RT PCR NEGATIVE NEGATIVE Final    Comment: (NOTE) SARS-CoV-2 target nucleic acids are NOT DETECTED.  The SARS-CoV-2 RNA is generally detectable in upper respiratory specimens during the acute phase of infection. The lowest concentration of SARS-CoV-2 viral copies this assay can detect is 138 copies/mL. A negative result does not preclude SARS-Cov-2 infection and should not be used as the sole basis for treatment or other patient management decisions. A negative result may occur with  improper specimen collection/handling, submission of specimen other than nasopharyngeal swab, presence of viral mutation(s) within the areas targeted by this assay, and inadequate number of viral copies(<138 copies/mL). A negative result must be combined  with clinical observations, patient history, and epidemiological information. The expected result is Negative.  Fact Sheet for Patients:  EntrepreneurPulse.com.au  Fact Sheet for Healthcare Providers:  IncredibleEmployment.be  This test is no t yet approved or cleared by the [place] FDA and  has been authorized for detection and/or diagnosis of SARS-CoV-2 by FDA under an Emergency Use Authorization (EUA). This EUA will remain  in effect (meaning this test can be used) for the duration of the COVID-19 declaration under Section 564(b)(1) of the Act, 21 U.S.C.section 360bbb-3(b)(1), unless the authorization is terminated  or revoked sooner.       Influenza A by PCR NEGATIVE NEGATIVE Final   Influenza B by PCR NEGATIVE NEGATIVE  Final    Comment: (NOTE) The Xpert Xpress SARS-CoV-2/FLU/RSV plus assay is intended as an aid in the diagnosis of influenza from Nasopharyngeal swab specimens and should not be used as a sole basis for treatment. Nasal washings and aspirates are unacceptable for Xpert Xpress SARS-CoV-2/FLU/RSV testing.  Fact Sheet for Patients: EntrepreneurPulse.com.au  Fact Sheet for Healthcare Providers: IncredibleEmployment.be  This test is not yet approved or cleared by the [place] FDA and has been authorized for detection and/or diagnosis of SARS-CoV-2 by FDA under an Emergency Use Authorization (EUA). This EUA will remain in effect (meaning this test can be used) for the duration of the COVID-19 declaration under Section 564(b)(1) of the Act, 21 U.S.C. section 360bbb-3(b)(1), unless the authorization is terminated or revoked.  Performed at [facility], [address] [place], [place] [address]   CULTURE, BLOOD (ROUTINE X 2) w Reflex to ID Panel     Status: None (Preliminary result)   Collection Time: 04/27/20  1:10 PM   Specimen: BLOOD  Result Value Ref Range Status    Specimen Description BLOOD LEFT ANTECUBITAL  Final   Special Requests   Final    BOTTLES DRAWN AEROBIC AND ANAEROBIC Blood Culture adequate volume   Culture   Final    NO GROWTH < 24 HOURS Performed at [facility], [address], [place], [place] [address]    Report Status PENDING  Incomplete  CULTURE, BLOOD (ROUTINE X 2) w Reflex to ID Panel     Status: None (Preliminary result)   Collection Time: 04/27/20  1:11 PM   Specimen: BLOOD  Result Value Ref Range Status   Specimen Description BLOOD RIGHT ANTECUBITAL  Final   Special Requests   Final    BOTTLES DRAWN AEROBIC AND ANAEROBIC Blood Culture results may not be optimal due to an excessive volume of blood received in culture bottles   Culture   Final    NO GROWTH < 24 HOURS Performed at [facility], [address] [place], [place] [address]    Report Status PENDING  Incomplete     Radiology Studies: DG Chest Portable 1 View  Result Date: 04/26/2020 CLINICAL DATA:  75 year old female with shortness of breath. EXAM: PORTABLE CHEST 1 VIEW COMPARISON:  Chest radiograph dated 05/29/2019. FINDINGS: Right-sided Port-A-Cath with tip at the cavoatrial junction. Left lung base opacity, likely combination of pleural effusion and associated atelectasis or infiltrate. There is diffuse vascular and interstitial prominence consistent with congestion and/or edema. No pneumothorax. Atherosclerotic calcification of the aortic arch. No acute osseous pathology. Lower cervical ACDF. IMPRESSION: Left-sided pleural effusion and associated atelectasis/infiltrate. Electronically Signed   By: Anner Crete M.D.   On: 04/26/2020 21:24    Scheduled Meds: . Chlorhexidine Gluconate Cloth  6 each Topical Daily  . feeding supplement  237 mL Oral TID BM  . lactulose  20 g Oral BID  . midodrine  7.5 mg Oral TID WC  . multivitamin with minerals  1 tablet Oral Daily  . octreotide  100 mcg Subcutaneous TID  . rifaximin  550 mg Oral BID   . sodium chloride flush  10-40 mL Intracatheter Q12H   Continuous Infusions: . albumin human 50 g (04/28/20 1208)  . cefTRIAXone (ROCEPHIN)  IV 2 g (04/27/20 2244)     LOS: 2 days   Time spent: 35 minutes. More than 50% of the time was spent in counseling/coordination of care  Lorella Nimrod, MD [facility]  If 7PM-7AM, please contact night-coverage [email or web address]  04/28/2020, 4:57 PM   This record has been created using Systems analyst. Errors have been sought and corrected,but may not always be located. Such creation errors do not reflect on the standard of care.

## 2020-04-28 NOTE — TOC Progression Note (Signed)
Transition of Care [facility]) - Progression Note    Patient Details  Name: Shannon Sawyer MRN: [id number] Date of Birth: 1945-08-27  Transition of Care [facility] CM/SW [place], RN Phone Number: 04/28/2020, 9:00 AM  Clinical Narrative:   APS report made to DSS, Cindi Carbon per Abuse and Neglect referral.      Barriers to Discharge: Continued Medical Work up  Expected Discharge Plan and Services   In-house Referral: Clinical Social Work,Hospice / Palliative Care Discharge Planning Services: CM Consult Post Acute Care Choice: NA Living arrangements for the past 2 months: Single Family Home                 DME Arranged: N/A DME Agency: NA                   Social Determinants of Health (SDOH) Interventions    Readmission Risk Interventions Readmission Risk Prevention Plan 04/27/2020  Transportation Screening Complete  PCP or Specialist Appt within 3-5 Days Complete  HRI or [place] Complete  Social Work Consult for [place] Planning/Counseling Complete  Palliative Care Screening Complete  Medication Review Press photographer) Complete  Some recent data might be hidden

## 2020-04-28 NOTE — Progress Notes (Signed)
Daily Progress Note   Patient Name: Shannon Sawyer       Date: 04/28/2020 DOB: [birth year]  Age: 75 y.o. MRN#: [id number] Attending Physician: Lorella Nimrod, MD Primary Care Physician: Gladstone Lighter, MD Admit Date: 04/26/2020  Reason for Consultation/Follow-up: Establishing goals of care  Subjective: Patient sipping ensure, denies complaints, smiling  Length of Stay: 2  Current Medications: Scheduled Meds:  . Chlorhexidine Gluconate Cloth  6 each Topical Daily  . feeding supplement  237 mL Oral TID BM  . lactulose  20 g Oral BID  . midodrine  5 mg Oral TID WC  . multivitamin with minerals  1 tablet Oral Daily  . rifaximin  550 mg Oral BID  . sodium chloride flush  10-40 mL Intracatheter Q12H    Continuous Infusions: . albumin human 50 g (04/28/20 1208)  . cefTRIAXone (ROCEPHIN)  IV 2 g (04/27/20 2244)    PRN Meds: sodium chloride flush  Physical Exam Constitutional:      General: She is not in acute distress. Pulmonary:     Effort: Pulmonary effort is normal.  Skin:    General: Skin is warm and dry.  Neurological:     Mental Status: She is alert.             Vital Signs: BP (!) 117/56 (BP Location: Left Arm)   Pulse 73   Temp 97.7 F (36.5 C) (Oral)   Resp 18   Ht 5' 1"  (1.549 m)   Wt 62.5 kg   SpO2 100%   BMI 26.03 kg/m  SpO2: SpO2: 100 % O2 Device: O2 Device: Room Air O2 Flow Rate: O2 Flow Rate (L/min): 1 L/min  Intake/output summary:   Intake/Output Summary (Last 24 hours) at 04/28/2020 1418 Last data filed at 04/28/2020 1337 Gross per 24 hour  Intake 937.96 ml  Output 200 ml  Net 737.96 ml   LBM: Last BM Date: 04/28/20 Baseline Weight: Weight: 60.8 kg Most recent weight: Weight: 62.5 kg       Palliative Assessment/Data: PPS 40%   Flowsheet Rows    Flowsheet Row Most Recent Value  Intake Tab   Referral Department Hospitalist  Unit at Time of Referral Cardiac/Telemetry Unit  Palliative Care Primary Diagnosis --  [cirrhosis]  Date Notified 04/27/20  Palliative Care Type New Palliative care  Reason for referral Clarify Goals of Care  Date of Admission 04/26/20  Date first seen by Palliative Care 04/27/20  # of days Palliative referral response time 0 Day(s)  # of days IP prior to Palliative referral 1  Clinical Assessment   Palliative Performance Scale Score 40%  Psychosocial & Spiritual Assessment   Palliative Care Outcomes   Patient/Family meeting held? Yes  Who was at the meeting? patient and spouse  Palliative Care Outcomes Clarified goals of care, Completed durable DNR, Changed CPR status      Patient Active Problem List   Diagnosis Date Noted  . Acute metabolic encephalopathy 08/81/1031  . CAP (community acquired pneumonia) 04/26/2020  . Vaginal yeast infection 07/29/2019  . Toenail fungus 07/29/2019  . SBP (spontaneous bacterial peritonitis) ([place]) 07/29/2019  . Poor dentition 07/28/2019  . Dysphagia 07/28/2019  . Post-menopausal bleeding 07/28/2019  .  Decompensated hepatic cirrhosis, non-alcoholic ([place]) 56/25/6389  . Hypokalemia 06/23/2019  . Stage 3b chronic kidney disease ([place]) 06/23/2019  . Leukocytosis 06/17/2019  . HLD (hyperlipidemia) 05/29/2019  . Anxiety 05/29/2019  . CAD (coronary artery disease) 05/29/2019  . Acute renal failure superimposed on stage IIIb chronic kidney disease ([place]) 05/29/2019  . Generalized weakness 05/29/2019  . Acute on chronic hyponatremia 05/29/2019  . Seasonal allergic rhinitis due to pollen 05/07/2018  . Thrombocytopenia ([place]) 02/03/2018  . H/O acute myocardial infarction 07/11/2017  . GERD (gastroesophageal reflux disease) 07/11/2017  . Bradycardia 11/06/2016  . Severe anxiety with panic 11/15/2015  . Memory change 06/30/2015  . H/O acute pancreatitis 11/18/2014  .  Cervical radiculopathy 06/03/2014  . TI (tricuspid incompetence) 02/12/2014  . Carotid artery narrowing 01/12/2014  . Non-alcoholic cirrhosis ([place]) 37/34/2876  . Anemia, iron deficiency 01/12/2014  . Breathlessness on exertion 01/12/2014  . Bilateral carotid artery stenosis 01/12/2014  . Cervical radiculitis 11/04/2013  . DDD (degenerative disc disease), cervical 11/04/2013  . SPL (spondylolisthesis) 11/04/2013  . Arteriosclerosis of coronary artery 05/20/2013  . Essential (primary) hypertension 05/20/2013  . Combined fat and carbohydrate induced hyperlipemia 05/20/2013  . NSTEMI (non-ST elevated myocardial infarction) ([place]) 12/04/2011  . Hyperglycemia 12/04/2011    Palliative Care Assessment & Plan   HPI: 75 y.o. female  with past medical history of CAD, HTN, depression, CKDIIIb,nonalcoholic liver cirrhosis, and not a liver transplant candidate admitted on 04/26/2020 with abnormal labs - worsening renal function. Hyperbilirubinemia, hyponatremia, leukocytosis, and anemia. Patient admitted with decompensated hepatic cirrhosis and acute renal failure. Patient has seen outpatient palliative recently. PMT consulted to discuss [place].   Assessment: Follow up with patient and spouse. No complaints. Spouse reviews patient's session with PT yesterday - tells me patient did better than he thought she could. We review recommendations for home with home health. He is happy with this recommendation, he was hoping to avoid SNF.   We also discuss the possibility of hospice support at home. I tell him I believe she would likely qualify. We discuss philosophy of hospice care and type of support provided. We discuss that she would not be eligible for both hospice and PT at home.   He is very interested in support of hospice - currently trying to decide if he would like to discharge with home health and once PT is completed transition to hospice or just discharge with hospice.  He does ask about prognosis.  He tells me he wants to be realistic and that hearing numbers/timelines/statistics are helpful for him. We discuss her MELD-Na - he tells me he was told at one point it was in the 1s. We discuss that her current lab values indicate a MELD-Na of 33. We did not discuss how this impacts prognosis (65 - 66% Estimated 90-Day Mortality) but he indicates he understands the severity of this and implications.   Mr. Clontz indicates his need for time to think about options and discuss with Jodi. He has my contact information. Will follow up with him tomorrow.   Recommendations/Plan: Spouse considering home with home health/palliative with later transition to hospice once PT complete vs home with hospice at discharge - either choice is reasonable depending on goals/needs Significant discussion about prognosis, spouse requesting time to process information  Code Status: DNR  Prognosis:  < 6 months  Discharge Planning: Home with Hospice  Care plan was discussed with patient and spouse  Thank you for allowing the Palliative Medicine Team to assist in the care of  this patient.   Total Time 30 minutes Prolonged Time Billed  no       Greater than 50%  of this time was spent counseling and coordinating care related to the above assessment and plan.  Juel Burrow, DNP, [facility] Palliative Medicine Team Team Phone # [phone]  Pager [phone]

## 2020-04-28 NOTE — Plan of Care (Signed)

## 2020-04-28 NOTE — Progress Notes (Signed)
Shannon Darby, MD [address]  [place]  [place], [place] [address]  Main: [phone]  Fax: [phone] Pager: [phone]   Subjective: Patient reports feeling better today.  Her husband is bedside.  She is still able to tolerate only small amounts by mouth.  Reports having bowel movements daily, which are brown in color.  She denies any abdominal pain.  Reports making good urine   Objective: Vital signs in last 24 hours: Vitals:   04/28/20 0412 04/28/20 0539 04/28/20 0846 04/28/20 1235  BP: (!) 116/56  (!) 116/53 (!) 117/56  Pulse: 77  74 73  Resp: 16  18 18   Temp: 98.1 F (36.7 C)  97.7 F (36.5 C) 97.7 F (36.5 C)  TempSrc: Oral  Oral Oral  SpO2: 96% 92% 95% 100%  Weight:  62.5 kg    Height:       Weight change: 1.718 kg  Intake/Output Summary (Last 24 hours) at 04/28/2020 1609 Last data filed at 04/28/2020 1337 Gross per 24 hour  Intake 937.96 ml  Output 200 ml  Net 737.96 ml     Exam: Heart:: Regular rate and rhythm or S1S2 present Lungs: normal and clear to auscultation Abdomen: Soft, nontender, mildly distended   Lab Results:  Current Facility-Administered Medications:  .  albumin human 25 % solution 50 g, 50 g, Intravenous, Daily, Caldwell Kronenberger, Tally Due, MD, Last Rate: 60 mL/hr at 04/28/20 1208, 50 g at 04/28/20 1208 .  cefTRIAXone (ROCEPHIN) 2 g in sodium chloride 0.9 % 100 mL IVPB, 2 g, Intravenous, Q24H, Judd Gaudier V, MD, Last Rate: 200 mL/hr at 04/27/20 2244, 2 g at 04/27/20 2244 .  Chlorhexidine Gluconate Cloth 2 % PADS 6 each, 6 each, Topical, Daily, Athena Masse, MD, 6 each at 04/28/20 1019 .  feeding supplement (ENSURE ENLIVE / ENSURE PLUS) liquid 237 mL, 237 mL, Oral, TID BM, Amin, Sumayya, MD, 237 mL at 04/28/20 1351 .  lactulose (CHRONULAC) 10 GM/15ML solution 20 g, 20 g, Oral, BID, Sarajane Fambrough, Tally Due, MD, 20 g at 04/28/20 1052 .  midodrine (PROAMATINE) tablet 5 mg, 5 mg, Oral, TID WC, Candiss Norse, Harmeet, MD, 5 mg at 04/28/20  1215 .  multivitamin with minerals tablet 1 tablet, 1 tablet, Oral, Daily, Lorella Nimrod, MD, 1 tablet at 04/28/20 1052 .  octreotide (SANDOSTATIN) injection 100 mcg, 100 mcg, Subcutaneous, TID, Niurka Benecke, Tally Due, MD .  rifaximin Doreene Nest) tablet 550 mg, 550 mg, Oral, BID, Abdelrahman Nair, Tally Due, MD, 550 mg at 04/28/20 1052 .  sodium chloride flush (NS) 0.9 % injection 10-40 mL, 10-40 mL, Intracatheter, Q12H, Judd Gaudier V, MD, 10 mL at 04/28/20 1019 .  sodium chloride flush (NS) 0.9 % injection 10-40 mL, 10-40 mL, Intracatheter, PRN, Athena Masse, MD  Micro Results: Recent Results (from the past 240 hour(s))  Resp Panel by RT-PCR (Flu A&B, Covid) Nasopharyngeal Swab     Status: None   Collection Time: 04/26/20  8:54 PM   Specimen: Nasopharyngeal Swab; Nasopharyngeal(NP) swabs in vial transport medium  Result Value Ref Range Status   SARS Coronavirus 2 by RT PCR NEGATIVE NEGATIVE Final    Comment: (NOTE) SARS-CoV-2 target nucleic acids are NOT DETECTED.  The SARS-CoV-2 RNA is generally detectable in upper respiratory specimens during the acute phase of infection. The lowest concentration of SARS-CoV-2 viral copies this assay can detect is 138 copies/mL. A negative result does not preclude SARS-Cov-2 infection and should not be used as the sole basis for treatment or  other patient management decisions. A negative result may occur with  improper specimen collection/handling, submission of specimen other than nasopharyngeal swab, presence of viral mutation(s) within the areas targeted by this assay, and inadequate number of viral copies(<138 copies/mL). A negative result must be combined with clinical observations, patient history, and epidemiological information. The expected result is Negative.  Fact Sheet for Patients:  EntrepreneurPulse.com.au  Fact Sheet for Healthcare Providers:  IncredibleEmployment.be  This test is no t yet approved or  cleared by the [place] FDA and  has been authorized for detection and/or diagnosis of SARS-CoV-2 by FDA under an Emergency Use Authorization (EUA). This EUA will remain  in effect (meaning this test can be used) for the duration of the COVID-19 declaration under Section 564(b)(1) of the Act, 21 U.S.C.section 360bbb-3(b)(1), unless the authorization is terminated  or revoked sooner.       Influenza A by PCR NEGATIVE NEGATIVE Final   Influenza B by PCR NEGATIVE NEGATIVE Final    Comment: (NOTE) The Xpert Xpress SARS-CoV-2/FLU/RSV plus assay is intended as an aid in the diagnosis of influenza from Nasopharyngeal swab specimens and should not be used as a sole basis for treatment. Nasal washings and aspirates are unacceptable for Xpert Xpress SARS-CoV-2/FLU/RSV testing.  Fact Sheet for Patients: EntrepreneurPulse.com.au  Fact Sheet for Healthcare Providers: IncredibleEmployment.be  This test is not yet approved or cleared by the [place] FDA and has been authorized for detection and/or diagnosis of SARS-CoV-2 by FDA under an Emergency Use Authorization (EUA). This EUA will remain in effect (meaning this test can be used) for the duration of the COVID-19 declaration under Section 564(b)(1) of the Act, 21 U.S.C. section 360bbb-3(b)(1), unless the authorization is terminated or revoked.  Performed at [facility], [address] [place], [place] [address]   CULTURE, BLOOD (ROUTINE X 2) w Reflex to ID Panel     Status: None (Preliminary result)   Collection Time: 04/27/20  1:10 PM   Specimen: BLOOD  Result Value Ref Range Status   Specimen Description BLOOD LEFT ANTECUBITAL  Final   Special Requests   Final    BOTTLES DRAWN AEROBIC AND ANAEROBIC Blood Culture adequate volume   Culture   Final    NO GROWTH < 24 HOURS Performed at [facility], [address] [place], [place] [address]    Report Status PENDING   Incomplete  CULTURE, BLOOD (ROUTINE X 2) w Reflex to ID Panel     Status: None (Preliminary result)   Collection Time: 04/27/20  1:11 PM   Specimen: BLOOD  Result Value Ref Range Status   Specimen Description BLOOD RIGHT ANTECUBITAL  Final   Special Requests   Final    BOTTLES DRAWN AEROBIC AND ANAEROBIC Blood Culture results may not be optimal due to an excessive volume of blood received in culture bottles   Culture   Final    NO GROWTH < 24 HOURS Performed at [facility], [address], [place], [place] [address]    Report Status PENDING  Incomplete   Studies/Results: DG Chest Portable 1 View  Result Date: 04/26/2020 CLINICAL DATA:  75 year old female with shortness of breath. EXAM: PORTABLE CHEST 1 VIEW COMPARISON:  Chest radiograph dated 05/29/2019. FINDINGS: Right-sided Port-A-Cath with tip at the cavoatrial junction. Left lung base opacity, likely combination of pleural effusion and associated atelectasis or infiltrate. There is diffuse vascular and interstitial prominence consistent with congestion and/or edema. No pneumothorax. Atherosclerotic calcification of the aortic arch. No acute  osseous pathology. Lower cervical ACDF. IMPRESSION: Left-sided pleural effusion and associated atelectasis/infiltrate. Electronically Signed   By: Anner Crete M.D.   On: 04/26/2020 21:24   Medications:  I have reviewed the patient's current medications. Prior to Admission:  Medications Prior to Admission  Medication Sig Dispense Refill Last Dose  . acetaminophen (TYLENOL) 500 MG tablet Take 500 mg by mouth every 6 (six) hours as needed.    prn at prn  . albumin human 25 % bottle Inject 50 grams no matter what 50 mL 0 Past Month at Unknown time  . alendronate (FOSAMAX) 70 MG tablet Take 70 mg by mouth once a week.   Past Week at Unknown time  . aMILoride (MIDAMOR) 5 MG tablet Take 10 mg by mouth in the morning and at bedtime.   Past Week at Unknown time  . aspirin EC 81 MG tablet  Take 81 mg by mouth daily.    Past Week at Unknown time  . furosemide (LASIX) 20 MG tablet Take 20 mg by mouth daily.   Past Week at Unknown time  . lactulose (CHRONULAC) 10 GM/15ML solution Take 20 g by mouth daily.    Past Week at Unknown time  . levofloxacin (LEVAQUIN) 250 MG tablet Take 250 mg by mouth daily.   Past Week at Unknown time  . mirtazapine (REMERON) 15 MG tablet Take 1 tablet (15 mg total) by mouth at bedtime. 90 tablet 1 Past Week at Unknown time  . nitroGLYCERIN (NITROSTAT) 0.4 MG SL tablet Place 0.4 mg under the tongue every 5 (five) minutes as needed for chest pain.    prn at prn  . omeprazole (PRILOSEC) 20 MG capsule Take 20 mg by mouth daily.    Past Week at Unknown time  . ondansetron (ZOFRAN) 4 MG tablet Take 4 mg by mouth daily.   Past Week at unknown  . propranolol (INDERAL) 10 MG tablet Take 10 mg by mouth 2 (two) times daily.   Past Week at Unknown time  . rifaximin (XIFAXAN) 550 MG TABS tablet Take 550 mg by mouth 2 (two) times daily.   Past Week at Unknown time  . sertraline (ZOLOFT) 25 MG tablet Take 25 mg by mouth daily.   Past Week at Unknown time   Scheduled: . Chlorhexidine Gluconate Cloth  6 each Topical Daily  . feeding supplement  237 mL Oral TID BM  . lactulose  20 g Oral BID  . midodrine  5 mg Oral TID WC  . multivitamin with minerals  1 tablet Oral Daily  . octreotide  100 mcg Subcutaneous TID  . rifaximin  550 mg Oral BID  . sodium chloride flush  10-40 mL Intracatheter Q12H   Continuous: . albumin human 50 g (04/28/20 1208)  . cefTRIAXone (ROCEPHIN)  IV 2 g (04/27/20 2244)   BEM:[id number] chloride flush Anti-infectives (From admission, onward)   Start     Dose/Rate Route Frequency Ordered Stop   04/27/20 2330  cefTRIAXone (ROCEPHIN) 2 g in sodium chloride 0.9 % 100 mL IVPB        2 g 200 mL/hr over 30 Minutes Intravenous Every 24 hours 04/26/20 2333 05/01/20 2329   04/27/20 2300  azithromycin (ZITHROMAX) 500 mg in sodium chloride 0.9 % 250 mL  IVPB  Status:  Discontinued        500 mg 250 mL/hr over 60 Minutes Intravenous Every 24 hours 04/26/20 2333 04/27/20 1349   04/27/20 1300  rifaximin (XIFAXAN) tablet 550 mg  550 mg Oral 2 times daily 04/27/20 1204     04/26/20 2230  cefTRIAXone (ROCEPHIN) 2 g in sodium chloride 0.9 % 100 mL IVPB  Status:  Discontinued        2 g 200 mL/hr over 30 Minutes Intravenous Every 24 hours 04/26/20 2222 04/27/20 0020   04/26/20 2230  azithromycin (ZITHROMAX) 500 mg in sodium chloride 0.9 % 250 mL IVPB  Status:  Discontinued        500 mg 250 mL/hr over 60 Minutes Intravenous Every 24 hours 04/26/20 2222 04/27/20 0019     Scheduled Meds: . Chlorhexidine Gluconate Cloth  6 each Topical Daily  . feeding supplement  237 mL Oral TID BM  . lactulose  20 g Oral BID  . midodrine  5 mg Oral TID WC  . multivitamin with minerals  1 tablet Oral Daily  . octreotide  100 mcg Subcutaneous TID  . rifaximin  550 mg Oral BID  . sodium chloride flush  10-40 mL Intracatheter Q12H   Continuous Infusions: . albumin human 50 g (04/28/20 1208)  . cefTRIAXone (ROCEPHIN)  IV 2 g (04/27/20 2244)   PRN Meds:.sodium chloride flush   Assessment: Principal Problem:   Decompensated hepatic cirrhosis, non-alcoholic (HCC) Active Problems:   Essential (primary) hypertension   Thrombocytopenia (HCC)   CAD (coronary artery disease)   Acute renal failure superimposed on stage IIIb chronic kidney disease (HCC)   Acute on chronic hyponatremia   Leukocytosis   CAP (community acquired pneumonia)   Plan: Jaslyn B Willig is a 75 y.o. female with decompensated NASH cirrhosis with ascites, hepatic encephalopathy, history of coronary disease s/p PCI, history of chronic kidney disease is admitted with acute on chronic decompensated cirrhosis of liver leading to worsening of renal function, worsening of hyponatremia as well as anemia  Decompensated cirrhosis of liver, child class C, meld sodium 33 On ceftriaxone for SBP  prophylaxis blood cultures no growth to date, no evidence of UTI based on urine analysis No evidence of active GI bleed to explain anemia at this time. Most likely secondary to decompensated liver disease, continue to monitor Continue lactulose to 20g twice daily, decrease the dose if needed to prevent dehydration Continue rifaximin 550 mg twice daily  AKI on CKD and hyponatremia, likely hepatorenal syndrome Hyponatremia is improving Continue albumin 50 g daily Continue midodrine 5 mg 3 times daily, can increase to 7.5 mg 3 times daily, will discuss with nephrology Recommend to start octreotide 183mg subcut 3 times daily  Patient is not a liver transplant candidate due to her overall functional status. Palliative care consultation is appropriate. If patient is not making meaningful recovery during this admission, recommend hospice  Thank you for involving me in the care of this patient.   LOS: 2 days   Takeria Marquina 04/28/2020, 4:09 PM

## 2020-04-29 DIAGNOSIS — Z515 Encounter for palliative care: Secondary | ICD-10-CM | POA: Diagnosis not present

## 2020-04-29 DIAGNOSIS — Z7189 Other specified counseling: Secondary | ICD-10-CM | POA: Diagnosis not present

## 2020-04-29 DIAGNOSIS — K709 Alcoholic liver disease, unspecified: Secondary | ICD-10-CM | POA: Diagnosis not present

## 2020-04-29 DIAGNOSIS — E871 Hypo-osmolality and hyponatremia: Secondary | ICD-10-CM | POA: Diagnosis not present

## 2020-04-29 DIAGNOSIS — K7290 Hepatic failure, unspecified without coma: Secondary | ICD-10-CM | POA: Diagnosis not present

## 2020-04-29 DIAGNOSIS — Z66 Do not resuscitate: Secondary | ICD-10-CM | POA: Diagnosis not present

## 2020-04-29 LAB — CBC WITH DIFFERENTIAL/PLATELET
Abs Immature Granulocytes: 0.07 10*3/uL (ref 0.00–0.07)
Basophils Absolute: 0 10*3/uL (ref 0.0–0.1)
Basophils Relative: 0 %
Eosinophils Absolute: 0.6 10*3/uL — ABNORMAL HIGH (ref 0.0–0.5)
Eosinophils Relative: 6 %
HCT: 22.9 % — ABNORMAL LOW (ref 36.0–46.0)
Hemoglobin: 8 g/dL — ABNORMAL LOW (ref 12.0–15.0)
Immature Granulocytes: 1 %
Lymphocytes Relative: 6 %
Lymphs Abs: 0.6 10*3/uL — ABNORMAL LOW (ref 0.7–4.0)
MCH: 35.7 pg — ABNORMAL HIGH (ref 26.0–34.0)
MCHC: 34.9 g/dL (ref 30.0–36.0)
MCV: 102.2 fL — ABNORMAL HIGH (ref 80.0–100.0)
Monocytes Absolute: 0.7 10*3/uL (ref 0.1–1.0)
Monocytes Relative: 7 %
Neutro Abs: 8.2 10*3/uL — ABNORMAL HIGH (ref 1.7–7.7)
Neutrophils Relative %: 80 %
Platelets: 38 10*3/uL — ABNORMAL LOW (ref 150–400)
RBC: 2.24 MIL/uL — ABNORMAL LOW (ref 3.87–5.11)
RDW: 19.5 % — ABNORMAL HIGH (ref 11.5–15.5)
WBC: 10.1 10*3/uL (ref 4.0–10.5)
nRBC: 0 % (ref 0.0–0.2)

## 2020-04-29 LAB — RENAL FUNCTION PANEL
Albumin: 4 g/dL (ref 3.5–5.0)
Anion gap: 14 (ref 5–15)
BUN: 174 mg/dL — ABNORMAL HIGH (ref 8–23)
CO2: 16 mmol/L — ABNORMAL LOW (ref 22–32)
Calcium: 8.6 mg/dL — ABNORMAL LOW (ref 8.9–10.3)
Chloride: 102 mmol/L (ref 98–111)
Creatinine, Ser: 2.31 mg/dL — ABNORMAL HIGH (ref 0.44–1.00)
GFR, Estimated: 22 mL/min — ABNORMAL LOW (ref >60)
Glucose, Bld: 123 mg/dL — ABNORMAL HIGH (ref 70–99)
Phosphorus: 6.7 mg/dL — ABNORMAL HIGH (ref 2.5–4.6)
Potassium: 3.9 mmol/L (ref 3.5–5.1)
Sodium: 132 mmol/L — ABNORMAL LOW (ref 135–145)

## 2020-04-29 LAB — CBC
HCT: 18.7 % — ABNORMAL LOW (ref 36.0–46.0)
Hemoglobin: 6.8 g/dL — ABNORMAL LOW (ref 12.0–15.0)
MCH: 37.8 pg — ABNORMAL HIGH (ref 26.0–34.0)
MCHC: 36.4 g/dL — ABNORMAL HIGH (ref 30.0–36.0)
MCV: 103.9 fL — ABNORMAL HIGH (ref 80.0–100.0)
Platelets: 44 10*3/uL — ABNORMAL LOW (ref 150–400)
RBC: 1.8 MIL/uL — ABNORMAL LOW (ref 3.87–5.11)
RDW: 17.7 % — ABNORMAL HIGH (ref 11.5–15.5)
WBC: 12.2 10*3/uL — ABNORMAL HIGH (ref 4.0–10.5)
nRBC: 0.2 % (ref 0.0–0.2)

## 2020-04-29 LAB — ABO/RH: ABO/RH(D): O POS

## 2020-04-29 LAB — URINE CULTURE

## 2020-04-29 LAB — HEMOGLOBIN AND HEMATOCRIT, BLOOD
HCT: 26.6 % — ABNORMAL LOW (ref 36.0–46.0)
Hemoglobin: 9.2 g/dL — ABNORMAL LOW (ref 12.0–15.0)

## 2020-04-29 LAB — PREPARE RBC (CROSSMATCH)

## 2020-04-29 LAB — GLUCOSE, CAPILLARY: Glucose-Capillary: 119 mg/dL — ABNORMAL HIGH (ref 70–99)

## 2020-04-29 MED ORDER — SODIUM CHLORIDE 0.9% IV SOLUTION: Freq: Once | INTRAVENOUS | Status: DC

## 2020-04-29 MED ORDER — SODIUM BICARBONATE 650 MG PO TABS
650.0000 mg | ORAL_TABLET | Freq: Three times a day (TID) | ORAL | Status: DC
  Administered 2020-04-29 – 2020-05-01 (×9): 650 mg via ORAL
  Filled 2020-04-29 (×10): qty 1

## 2020-04-29 NOTE — Plan of Care (Signed)

## 2020-04-29 NOTE — Progress Notes (Signed)
PROGRESS NOTE    Shannon Sawyer  GUY:[id number] DOB: Dec 07, 1945 DOA: 04/26/2020 PCP: Gladstone Lighter, MD   Brief Narrative: Taken from H&P. Shannon Sawyer is a 75 y.o. female with medical history significant for CAD, HTN, depression, CKD IIIb, nonalcoholic liver cirrhosis, not a liver transplant candidate, who was recently referred to palliative care who was sent in by GI for evaluation of altered mental status and concern for labs. Patient is having worsening renal function and her home dose of Levaquin which she was taking for SBP prophylaxis was held. Patient with worsening liver functions, hyperbilirubinemia, hyponatremia, thrombocytopenia, leukocytosis which seems chronic and anemia. Ultrasound with mild ascites.  Procalcitonin elevated at 0.88 Started on ceftriaxone for concern of SBP. Meld score of 33 and class C child Pugh score, mortality of 52.6% and next 3 months.  Subjective: Patient appears more somnolent, easily arousable and answering questions appropriately, stating that she is more tired.  Per patient she did slept well last night.  No nausea or vomiting but appetite remains very poor.  Assessment & Plan:   Principal Problem:   Decompensated hepatic cirrhosis, non-alcoholic (HCC) Active Problems:   Essential (primary) hypertension   Thrombocytopenia (HCC)   CAD (coronary artery disease)   Acute renal failure superimposed on stage IIIb chronic kidney disease (HCC)   Acute on chronic hyponatremia   Leukocytosis   CAP (community acquired pneumonia)  Decompensated nonalcoholic hepatic cirrhosis. With class C liver cirrhosis and meld score of 33 which makes her with very increased risk of mortality, or 52.6% in the next 63-month Worsening liver functions and failure. Patient also developed hepatorenal syndrome with worsening renal function. Exam positive for flapping tremors and marked scleral icterus, Patient was on Levaquin for SBP prophylaxis which was discontinued  recently due to worsening renal function. She was started on ceftriaxone and Zithromax, chest x-ray with infiltrate/atelectasis, most likely atelectasis as there is no significant upper respiratory symptoms.  Patient does has leukocytosis and elevated procalcitonin.  Leukocytosis seems chronic. Not a candidate for liver transplant. -GI started her on albumin and octreotide. -Midodrine dose was increased to 7.5 mg 3 times daily at GI request, with some improvement in blood pressure. -Continue with lactulose and rifaximin. -Continue with ceftriaxone -Continue with supportive care. -Palliative care consult-patient is very high risk for deterioration and death, ongoing discussion, patient was made DNR. -Had another discussion with husband he wants to talk with nephrology regarding dialysis.  Leukocytosis.  Seems chronic per chart review.  Procalcitonin elevated at 0.88 but that is unreliable with CKD. Chest x-ray with some concern of infiltrate/atelectasis but no upper respiratory symptoms, less likely pneumonia.  UA with some leukocytes. She was started on Zithromax and ceftriaxone for a possible CAPl, Zithromax was discontinued. -Continue with ceftriaxone as it will cover for any SBP. -Obtain blood cultures-remain negative -Urine cultures negative. -Can be an underlying hematologic abnormality secondary to liver disease.  Acute on chronic hyponatremia.  Most likely secondary to liver disease.  Urinary sodium less than 10, elevated serum osmolality at 324. Baseline sodium around 126-130.  Patient did received some IV fluid. Patient was on Zoloft and Lasix at home which can be contributory. Nephrology was consulted at the request of GI. Sodium improved to 132 with fluid restriction today. -Keep holding Zoloft and Lasix -Monitor sodium  Thrombocytopenia.  Most likely secondary to liver disease. Platelets at 44 today.  No obvious bleeding, multiple bruising involving extremities. -Continue to  monitor.  Anemia.  Hemoglobin at 6.8 today, no obvious  bleeding and stool occult card was negative.  Anemia panel consistent with anemia of chronic disease, most likely secondary to liver and kidney disease. -Give her 2 unit of PRBC -Continue to monitor -Transfuse if below 7  AKI with CKD stage IIIb.  Worsening creatinine and BUN.  Most likely secondary to hepatorenal syndrome and poor p.o. intake. Increased somnolence most likely secondary to azotemia. -Nephrology was consulted-we will appreciate their recommendations. -Continue with midodrine to avoid hypotension, dose increased on 2/24.. -Monitor renal function -Avoid nephrotoxins  Hypertension.  Blood pressure within goal today. -Patient is on midodrine. -Keep holding home antihypertensives  CAD.  No chest pain and EKG without any acute changes. -Continue aspirin. -Holding propranolol due to softer blood pressure, can be resumed if blood pressure improves.   Objective: Vitals:   04/29/20 0801 04/29/20 1113 04/29/20 1119 04/29/20 1137  BP: (!) 115/49 (!) 115/48 (!) 115/48 (!) 113/48  Pulse: 72 67 68 64  Resp: 20 18 16 14   Temp: 97.8 F (36.6 C) 98.4 F (36.9 C) 98.4 F (36.9 C) 97.6 F (36.4 C)  TempSrc: Oral Oral Oral Oral  SpO2: 93%  94% 94%  Weight:      Height:        Intake/Output Summary (Last 24 hours) at 04/29/2020 1313 Last data filed at 04/29/2020 1113 Gross per 24 hour  Intake 990 ml  Output --  Net 990 ml   Filed Weights   04/27/20 0027 04/28/20 0539 04/29/20 0316  Weight: 61.6 kg 62.5 kg 65.4 kg    Examination:  General.  Chronically ill-appearing, somnolent lady, in no acute distress. Pulmonary.  Lungs clear bilaterally, normal respiratory effort. CV.  Regular rate and rhythm, no JVD, rub or murmur. Abdomen.  Soft, nontender, mildly distended, BS positive. CNS.  Somnolent but easily arousable, no focal neurologic deficit. Extremities.  No edema, no cyanosis, pulses intact and  symmetrical. Psychiatry.  Judgment and insight appears normal.  DVT prophylaxis: SCDs Code Status: DNR Family Communication: Discussed with husband on phone. Disposition Plan:  Status is: Inpatient  Remains inpatient appropriate because:Inpatient level of care appropriate due to severity of illness   Dispo: The patient is from: Home              Anticipated d/c is to: To be determined              Anticipated d/c date is: 2 days              Patient currently is not medically stable to d/c.   Difficult to place patient No              Level of care: Progressive Cardiac  Patient is very high risk for deterioration and death.  All the records are reviewed and case discussed with Care Management/Social Worker. Management plans discussed with the patient, nursing and they are in agreement.  Consultants:   GI  Nephrology  Palliative care  Procedures:  Antimicrobials:  Ceftriaxone  Data Reviewed: I have personally reviewed following labs and imaging studies  CBC: Recent Labs  Lab 04/25/20 1401 04/26/20 1601 04/27/20 0415 04/28/20 0545 04/29/20 0530  WBC 14.8* 16.4* 15.8* 15.3* 12.2*  NEUTROABS  --   --  13.5*  --   --   HGB 8.9* 8.0* 7.4* 7.6* 6.8*  HCT 24.3* 22.8* 20.8* 20.5* 18.7*  MCV 104.3* 105.6* 105.6* 102.5* 103.9*  PLT 45* 45* 37* 45* 44*   Basic Metabolic Panel: Recent Labs  Lab 04/25/20 1401 04/26/20  1601 04/27/20 0415 04/28/20 0545 04/29/20 0520  NA 126* 122* 124*  124* 129* 132*  K 4.0 4.0 4.1 3.8 3.9  CL 97* 93* 96* 99 102  CO2 16* 17* 18* 18* 16*  GLUCOSE 130* 130* 97 103* 123*  BUN 152* 157* 159* 162* 174*  CREATININE 1.94* 2.12* 2.07* 2.25* 2.31*  CALCIUM 9.1 8.8* 8.2* 8.7* 8.6*  MG 2.5*  --   --   --   --   PHOS 4.9*  --   --   --  6.7*   GFR: Estimated Creatinine Clearance: 18.5 mL/min (A) (by C-G formula based on SCr of 2.31 mg/dL (H)). Liver Function Tests: Recent Labs  Lab 04/25/20 1401 04/26/20 1601 04/27/20 0415  04/28/20 0545 04/29/20 0520  AST 110* 106* 92* 88*  --   ALT 41 39 36 33  --   ALKPHOS 110 116 96 77  --   BILITOT 7.0* 8.0* 7.6* 7.2*  --   PROT 7.3 7.2 6.7 7.2  --   ALBUMIN 3.7 3.6 3.3* 4.0 4.0   No results for input(s): LIPASE, AMYLASE in the last 168 hours. Recent Labs  Lab 04/26/20 2054  AMMONIA 44*   Coagulation Profile: Recent Labs  Lab 04/27/20 0909  INR 2.1*   Cardiac Enzymes: No results for input(s): CKTOTAL, CKMB, CKMBINDEX, TROPONINI in the last 168 hours. BNP (last 3 results) No results for input(s): PROBNP in the last 8760 hours. HbA1C: No results for input(s): HGBA1C in the last 72 hours. CBG: Recent Labs  Lab 04/29/20 0829  GLUCAP 119*   Lipid Profile: No results for input(s): CHOL, HDL, LDLCALC, TRIG, CHOLHDL, LDLDIRECT in the last 72 hours. Thyroid Function Tests: No results for input(s): TSH, T4TOTAL, FREET4, T3FREE, THYROIDAB in the last 72 hours. Anemia Panel: Recent Labs    04/27/20 0909 04/28/20 0545  VITAMINB12 1,495*  --   FOLATE  --  39.0  FERRITIN  --  194  TIBC  --  227*  IRON  --  29  RETICCTPCT  --  5.7*   Sepsis Labs: Recent Labs  Lab 04/26/20 2054 04/27/20 0415  PROCALCITON  --  0.88  LATICACIDVEN 1.7  --     Recent Results (from the past 240 hour(s))  Urine Culture     Status: Abnormal   Collection Time: 04/26/20  4:01 PM   Specimen: Urine, Random  Result Value Ref Range Status   Specimen Description   Final    URINE, RANDOM Performed at [facility], [address] [place], [place] [address]    Special Requests   Final    NONE Performed at [facility], [address] [place], [place] [address]    Culture MULTIPLE SPECIES PRESENT, SUGGEST RECOLLECTION (A)  Final   Report Status 04/29/2020 FINAL  Final  Resp Panel by RT-PCR (Flu A&B, Covid) Nasopharyngeal Swab     Status: None   Collection Time: 04/26/20  8:54 PM   Specimen: Nasopharyngeal Swab; Nasopharyngeal(NP) swabs in vial  transport medium  Result Value Ref Range Status   SARS Coronavirus 2 by RT PCR NEGATIVE NEGATIVE Final    Comment: (NOTE) SARS-CoV-2 target nucleic acids are NOT DETECTED.  The SARS-CoV-2 RNA is generally detectable in upper respiratory specimens during the acute phase of infection. The lowest concentration of SARS-CoV-2 viral copies this assay can detect is 138 copies/mL. A negative result does not preclude SARS-Cov-2 infection and should not be used as the sole basis for treatment or other patient  management decisions. A negative result may occur with  improper specimen collection/handling, submission of specimen other than nasopharyngeal swab, presence of viral mutation(s) within the areas targeted by this assay, and inadequate number of viral copies(<138 copies/mL). A negative result must be combined with clinical observations, patient history, and epidemiological information. The expected result is Negative.  Fact Sheet for Patients:  EntrepreneurPulse.com.au  Fact Sheet for Healthcare Providers:  IncredibleEmployment.be  This test is no t yet approved or cleared by the [place] FDA and  has been authorized for detection and/or diagnosis of SARS-CoV-2 by FDA under an Emergency Use Authorization (EUA). This EUA will remain  in effect (meaning this test can be used) for the duration of the COVID-19 declaration under Section 564(b)(1) of the Act, 21 U.S.C.section 360bbb-3(b)(1), unless the authorization is terminated  or revoked sooner.       Influenza A by PCR NEGATIVE NEGATIVE Final   Influenza B by PCR NEGATIVE NEGATIVE Final    Comment: (NOTE) The Xpert Xpress SARS-CoV-2/FLU/RSV plus assay is intended as an aid in the diagnosis of influenza from Nasopharyngeal swab specimens and should not be used as a sole basis for treatment. Nasal washings and aspirates are unacceptable for Xpert Xpress SARS-CoV-2/FLU/RSV testing.  Fact  Sheet for Patients: EntrepreneurPulse.com.au  Fact Sheet for Healthcare Providers: IncredibleEmployment.be  This test is not yet approved or cleared by the [place] FDA and has been authorized for detection and/or diagnosis of SARS-CoV-2 by FDA under an Emergency Use Authorization (EUA). This EUA will remain in effect (meaning this test can be used) for the duration of the COVID-19 declaration under Section 564(b)(1) of the Act, 21 U.S.C. section 360bbb-3(b)(1), unless the authorization is terminated or revoked.  Performed at [facility], [address] [place], [place] [address]   CULTURE, BLOOD (ROUTINE X 2) w Reflex to ID Panel     Status: None (Preliminary result)   Collection Time: 04/27/20  1:10 PM   Specimen: BLOOD  Result Value Ref Range Status   Specimen Description BLOOD LEFT ANTECUBITAL  Final   Special Requests   Final    BOTTLES DRAWN AEROBIC AND ANAEROBIC Blood Culture adequate volume   Culture   Final    NO GROWTH 2 DAYS Performed at [facility], [address] [place], [place] [address]    Report Status PENDING  Incomplete  CULTURE, BLOOD (ROUTINE X 2) w Reflex to ID Panel     Status: None (Preliminary result)   Collection Time: 04/27/20  1:11 PM   Specimen: BLOOD  Result Value Ref Range Status   Specimen Description BLOOD RIGHT ANTECUBITAL  Final   Special Requests   Final    BOTTLES DRAWN AEROBIC AND ANAEROBIC Blood Culture results may not be optimal due to an excessive volume of blood received in culture bottles   Culture   Final    NO GROWTH 2 DAYS Performed at [facility], [address], [place], [place] [address]    Report Status PENDING  Incomplete     Radiology Studies: No results found.  Scheduled Meds: . sodium chloride   Intravenous Once  . Chlorhexidine Gluconate Cloth  6 each Topical Daily  . feeding supplement  237 mL Oral TID BM  . lactulose  20 g Oral BID  .  midodrine  7.5 mg Oral TID WC  . multivitamin with minerals  1 tablet Oral Daily  . octreotide  100 mcg Subcutaneous TID  . rifaximin  550 mg Oral  BID  . sodium bicarbonate  650 mg Oral TID  . sodium chloride flush  10-40 mL Intracatheter Q12H   Continuous Infusions: . albumin human 50 g (04/29/20 1012)  . cefTRIAXone (ROCEPHIN)  IV 2 g (04/28/20 2310)     LOS: 3 days   Time spent: 35 minutes. More than 50% of the time was spent in counseling/coordination of care  Lorella Nimrod, MD [facility]  If 7PM-7AM, please contact night-coverage [email or web address]  04/29/2020, 1:13 PM   This record has been created using Systems analyst. Errors have been sought and corrected,but may not always be located. Such creation errors do not reflect on the standard of care.

## 2020-04-29 NOTE — Care Management Important Message (Signed)
Important Message  Patient Details  Name: Shannon Sawyer MRN: [id number] Date of Birth: 26-Jan-1946   Medicare Important Message Given:  Yes     Dannette Barbara 04/29/2020, 2:39 PM

## 2020-04-29 NOTE — Progress Notes (Signed)
Daily Progress Note   Patient Name: Shannon Sawyer       Date: 04/29/2020 DOB: Dec 08, 1945  Age: 75 y.o. MRN#: [id number] Attending Physician: Lorella Nimrod, MD Primary Care Physician: Gladstone Lighter, MD Admit Date: 04/26/2020  Reason for Consultation/Follow-up: Establishing goals of care  Subjective: Smiling, eating fruit plate. Denies complaints.  Length of Stay: 3  Current Medications: Scheduled Meds:  . sodium chloride   Intravenous Once  . Chlorhexidine Gluconate Cloth  6 each Topical Daily  . feeding supplement  237 mL Oral TID BM  . lactulose  20 g Oral BID  . midodrine  7.5 mg Oral TID WC  . multivitamin with minerals  1 tablet Oral Daily  . octreotide  100 mcg Subcutaneous TID  . rifaximin  550 mg Oral BID  . sodium bicarbonate  650 mg Oral TID  . sodium chloride flush  10-40 mL Intracatheter Q12H    Continuous Infusions: . albumin human 50 g (04/29/20 1012)  . cefTRIAXone (ROCEPHIN)  IV 2 g (04/28/20 2310)    PRN Meds: sodium chloride flush  Physical Exam Constitutional:      General: She is not in acute distress.    Comments: drowsy  Pulmonary:     Effort: Pulmonary effort is normal.  Skin:    General: Skin is warm and dry.     Coloration: Skin is jaundiced.  Neurological:     Mental Status: She is oriented to person, place, and time.             Vital Signs: BP (!) 118/42   Pulse 64   Temp (!) 97.5 F (36.4 C) (Axillary)   Resp 16   Ht 5' 1"  (1.549 m)   Wt 65.4 kg   SpO2 96%   BMI 27.23 kg/m  SpO2: SpO2: 96 % O2 Device: O2 Device: Room Air O2 Flow Rate: O2 Flow Rate (L/min): 1 L/min  Intake/output summary:   Intake/Output Summary (Last 24 hours) at 04/29/2020 1423 Last data filed at 04/29/2020 1412 Gross per 24 hour  Intake 1122.92 ml  Output --   Net 1122.92 ml   LBM: Last BM Date: 04/29/20 Baseline Weight: Weight: 60.8 kg Most recent weight: Weight: 65.4 kg       Palliative Assessment/Data: PPS 40%   Flowsheet Rows   Flowsheet Row Most Recent Value  Intake Tab   Referral Department Hospitalist  Unit at Time of Referral Cardiac/Telemetry Unit  Palliative Care Primary Diagnosis --  [cirrhosis]  Date Notified 04/27/20  Palliative Care Type New Palliative care  Reason for referral Clarify Goals of Care  Date of Admission 04/26/20  Date first seen by Palliative Care 04/27/20  # of days Palliative referral response time 0 Day(s)  # of days IP prior to Palliative referral 1  Clinical Assessment   Palliative Performance Scale Score 40%  Psychosocial & Spiritual Assessment   Palliative Care Outcomes   Patient/Family meeting held? Yes  Who was at the meeting? patient and spouse  Palliative Care Outcomes Clarified goals of care, Completed durable DNR, Changed CPR status      Patient Active Problem List   Diagnosis Date Noted  . Acute metabolic encephalopathy 84/13/2440  .  CAP (community acquired pneumonia) 04/26/2020  . Vaginal yeast infection 07/29/2019  . Toenail fungus 07/29/2019  . SBP (spontaneous bacterial peritonitis) ([place]) 07/29/2019  . Poor dentition 07/28/2019  . Dysphagia 07/28/2019  . Post-menopausal bleeding 07/28/2019  . Decompensated hepatic cirrhosis, non-alcoholic ([place]) 75/12/2583  . Hypokalemia 06/23/2019  . Stage 3b chronic kidney disease ([place]) 06/23/2019  . Leukocytosis 06/17/2019  . HLD (hyperlipidemia) 05/29/2019  . Anxiety 05/29/2019  . CAD (coronary artery disease) 05/29/2019  . Acute renal failure superimposed on stage IIIb chronic kidney disease ([place]) 05/29/2019  . Generalized weakness 05/29/2019  . Acute on chronic hyponatremia 05/29/2019  . Seasonal allergic rhinitis due to pollen 05/07/2018  . Thrombocytopenia ([place]) 02/03/2018  . H/O acute myocardial infarction 07/11/2017  . GERD  (gastroesophageal reflux disease) 07/11/2017  . Bradycardia 11/06/2016  . Severe anxiety with panic 11/15/2015  . Memory change 06/30/2015  . H/O acute pancreatitis 11/18/2014  . Cervical radiculopathy 06/03/2014  . TI (tricuspid incompetence) 02/12/2014  . Carotid artery narrowing 01/12/2014  . Non-alcoholic cirrhosis ([place]) 27/78/2423  . Anemia, iron deficiency 01/12/2014  . Breathlessness on exertion 01/12/2014  . Bilateral carotid artery stenosis 01/12/2014  . Cervical radiculitis 11/04/2013  . DDD (degenerative disc disease), cervical 11/04/2013  . SPL (spondylolisthesis) 11/04/2013  . Arteriosclerosis of coronary artery 05/20/2013  . Essential (primary) hypertension 05/20/2013  . Combined fat and carbohydrate induced hyperlipemia 05/20/2013  . NSTEMI (non-ST elevated myocardial infarction) ([place]) 12/04/2011  . Hyperglycemia 12/04/2011    Palliative Care Assessment & Plan   HPI: 75 y.o. female  with past medical history of CAD, HTN, depression, CKDIIIb,nonalcoholic liver cirrhosis, and not a liver transplant candidate admitted on 04/26/2020 with abnormal labs - worsening renal function. Hyperbilirubinemia, hyponatremia, leukocytosis, and anemia. Patient admitted with decompensated hepatic cirrhosis and acute renal failure. Patient has seen outpatient palliative recently. PMT consulted to discuss [place].   Assessment: Follow up with patient and spouse.   Spouse tells me he is a bit confused about plan and unsure how to move forward. He tells me yesterday he was leaning towards going home with hospice; however, now dialysis has been brought up as an option and he is unsure how to proceed.   He tells me he has plans to speak with nephrology later today and he is hopeful this provides clarity to situation. He tells me he feels like patient is too weak for dialysis. We discuss quality of life issues surrounding dialysis. We discuss that dialysis is not without risks - he is well aware. We  discuss that it may extend length of life but Shannon Sawyer's life expectancy is still limited d/t liver failure even if she moves forward with dialysis. We discuss considering quality of life as he moves forward with decisions.   Shannon Sawyer nods during conversation and seems to understand. When I ask her what is most important to her she tells me she feels as though she does not have a choice - any choice is not a good choice.   Mr Cumberland brings up liver transplant - tells me he did not think this was an option but is now unsure. He speaks of maybe taking Shannon. Lorman to her doctor at [facility] one more time before making any decisions regarding how to move forward with her care.   At the end of the conversation, Mr Vise indicates he thinks going home with the extra support of hospice may be the best option but would like more clarity from other members of the team  before proceeding.   Recommendations/Plan: Spouse leaning towards taking patient home with hospice but feels he needs more clarity from other members of the medical team before making final decisions. Specifically he would like to hear from nephrology - is HD a realistic option, how would HD affect quality and length of life? He would also like to hear from GI specifically about liver transplant  Code Status: DNR  Prognosis:  < 6 months  Discharge Planning: Home with Hospice depending on ongoing conversations with medical team  Care plan was discussed with patient and spouse  Thank you for allowing the Palliative Medicine Team to assist in the care of this patient.   Total Time 30 minutes Prolonged Time Billed  no    Greater than 50%  of this time was spent counseling and coordinating care related to the above assessment and plan.  Juel Burrow, DNP, [facility] Palliative Medicine Team Team Phone # [phone]  Pager [phone]

## 2020-04-29 NOTE — Progress Notes (Signed)
Cephas Darby, MD [address]  [place]  [place], [place] [address]  Main: [phone]  Fax: [phone] Pager: [phone]   Subjective: No acute events overnight, remains the same, reports having brown bowel movements, currently receiving blood transfusion due to drop in hemoglobin to 6.8  Objective: Vital signs in last 24 hours: Vitals:   04/29/20 0801 04/29/20 1113 04/29/20 1119 04/29/20 1137  BP: (!) 115/49 (!) 115/48 (!) 115/48 (!) 113/48  Pulse: 72 67 68 64  Resp: 20 18 16 14   Temp: 97.8 F (36.6 C) 98.4 F (36.9 C) 98.4 F (36.9 C) 97.6 F (36.4 C)  TempSrc: Oral Oral Oral Oral  SpO2: 93%  94% 94%  Weight:      Height:       Weight change: 2.863 kg  Intake/Output Summary (Last 24 hours) at 04/29/2020 1413 Last data filed at 04/29/2020 1113 Gross per 24 hour  Intake 750 ml  Output --  Net 750 ml     Exam: Heart:: Regular rate and rhythm or S1S2 present Lungs: normal and clear to auscultation Abdomen: Soft, nontender, mildly distended   Lab Results:  Current Facility-Administered Medications:  .  0.9 %  sodium chloride infusion (Manually program via Guardrails IV Fluids), , Intravenous, Once, Amin, Soundra Pilon, MD .  albumin human 25 % solution 50 g, 50 g, Intravenous, Daily, Alejo Beamer, Tally Due, MD, Last Rate: 60 mL/hr at 04/29/20 1012, 50 g at 04/29/20 1012 .  cefTRIAXone (ROCEPHIN) 2 g in sodium chloride 0.9 % 100 mL IVPB, 2 g, Intravenous, Q24H, Judd Gaudier V, MD, Last Rate: 200 mL/hr at 04/28/20 2310, 2 g at 04/28/20 2310 .  Chlorhexidine Gluconate Cloth 2 % PADS 6 each, 6 each, Topical, Daily, Athena Masse, MD, 6 each at 04/29/20 [phone] .  feeding supplement (ENSURE ENLIVE / ENSURE PLUS) liquid 237 mL, 237 mL, Oral, TID BM, Lorella Nimrod, MD, 237 mL at 04/29/20 0923 .  lactulose (CHRONULAC) 10 GM/15ML solution 20 g, 20 g, Oral, BID, Meshell Abdulaziz, Tally Due, MD, 20 g at 04/28/20 1052 .  midodrine (PROAMATINE) tablet 7.5 mg, 7.5 mg, Oral, TID  WC, Amin, Sumayya, MD, 7.5 mg at 04/29/20 1154 .  multivitamin with minerals tablet 1 tablet, 1 tablet, Oral, Daily, Lorella Nimrod, MD, 1 tablet at 04/29/20 0917 .  octreotide (SANDOSTATIN) injection 100 mcg, 100 mcg, Subcutaneous, TID, Carrieanne Kleen, Tally Due, MD, 100 mcg at 04/28/20 2139 .  rifaximin (XIFAXAN) tablet 550 mg, 550 mg, Oral, BID, Akeia Perot, Tally Due, MD, 550 mg at 04/29/20 5597 .  sodium bicarbonate tablet 650 mg, 650 mg, Oral, TID, Lorella Nimrod, MD, 650 mg at 04/29/20 0923 .  sodium chloride flush (NS) 0.9 % injection 10-40 mL, 10-40 mL, Intracatheter, Q12H, Athena Masse, MD, 10 mL at 04/29/20 0921 .  sodium chloride flush (NS) 0.9 % injection 10-40 mL, 10-40 mL, Intracatheter, PRN, Athena Masse, MD, 10 mL at 04/29/20 0920  Micro Results: Recent Results (from the past 240 hour(s))  Urine Culture     Status: Abnormal   Collection Time: 04/26/20  4:01 PM   Specimen: Urine, Random  Result Value Ref Range Status   Specimen Description   Final    URINE, RANDOM Performed at [facility], [address] [place], [place] [address]    Special Requests   Final    NONE Performed at [facility], [address] [place], [place] [address]    Culture MULTIPLE SPECIES PRESENT, SUGGEST RECOLLECTION (A)  Final   Report Status 04/29/2020 FINAL  Final  Resp Panel by RT-PCR (Flu A&B, Covid) Nasopharyngeal Swab     Status: None   Collection Time: 04/26/20  8:54 PM   Specimen: Nasopharyngeal Swab; Nasopharyngeal(NP) swabs in vial transport medium  Result Value Ref Range Status   SARS Coronavirus 2 by RT PCR NEGATIVE NEGATIVE Final    Comment: (NOTE) SARS-CoV-2 target nucleic acids are NOT DETECTED.  The SARS-CoV-2 RNA is generally detectable in upper respiratory specimens during the acute phase of infection. The lowest concentration of SARS-CoV-2 viral copies this assay can detect is 138 copies/mL. A negative result does not preclude SARS-Cov-2 infection and  should not be used as the sole basis for treatment or other patient management decisions. A negative result may occur with  improper specimen collection/handling, submission of specimen other than nasopharyngeal swab, presence of viral mutation(s) within the areas targeted by this assay, and inadequate number of viral copies(<138 copies/mL). A negative result must be combined with clinical observations, patient history, and epidemiological information. The expected result is Negative.  Fact Sheet for Patients:  EntrepreneurPulse.com.au  Fact Sheet for Healthcare Providers:  IncredibleEmployment.be  This test is no t yet approved or cleared by the [place] FDA and  has been authorized for detection and/or diagnosis of SARS-CoV-2 by FDA under an Emergency Use Authorization (EUA). This EUA will remain  in effect (meaning this test can be used) for the duration of the COVID-19 declaration under Section 564(b)(1) of the Act, 21 U.S.C.section 360bbb-3(b)(1), unless the authorization is terminated  or revoked sooner.       Influenza A by PCR NEGATIVE NEGATIVE Final   Influenza B by PCR NEGATIVE NEGATIVE Final    Comment: (NOTE) The Xpert Xpress SARS-CoV-2/FLU/RSV plus assay is intended as an aid in the diagnosis of influenza from Nasopharyngeal swab specimens and should not be used as a sole basis for treatment. Nasal washings and aspirates are unacceptable for Xpert Xpress SARS-CoV-2/FLU/RSV testing.  Fact Sheet for Patients: EntrepreneurPulse.com.au  Fact Sheet for Healthcare Providers: IncredibleEmployment.be  This test is not yet approved or cleared by the [place] FDA and has been authorized for detection and/or diagnosis of SARS-CoV-2 by FDA under an Emergency Use Authorization (EUA). This EUA will remain in effect (meaning this test can be used) for the duration of the COVID-19 declaration  under Section 564(b)(1) of the Act, 21 U.S.C. section 360bbb-3(b)(1), unless the authorization is terminated or revoked.  Performed at [facility], [address] [place], [place] [address]   CULTURE, BLOOD (ROUTINE X 2) w Reflex to ID Panel     Status: None (Preliminary result)   Collection Time: 04/27/20  1:10 PM   Specimen: BLOOD  Result Value Ref Range Status   Specimen Description BLOOD LEFT ANTECUBITAL  Final   Special Requests   Final    BOTTLES DRAWN AEROBIC AND ANAEROBIC Blood Culture adequate volume   Culture   Final    NO GROWTH 2 DAYS Performed at [facility], [address], [place], [place] [address]    Report Status PENDING  Incomplete  CULTURE, BLOOD (ROUTINE X 2) w Reflex to ID Panel     Status: None (Preliminary result)   Collection Time: 04/27/20  1:11 PM   Specimen: BLOOD  Result Value Ref Range Status   Specimen Description BLOOD RIGHT ANTECUBITAL  Final   Special Requests   Final    BOTTLES DRAWN AEROBIC AND ANAEROBIC Blood Culture results may not be  optimal due to an excessive volume of blood received in culture bottles   Culture   Final    NO GROWTH 2 DAYS Performed at [facility], [address] [place], [place] [address]    Report Status PENDING  Incomplete   Studies/Results: No results found. Medications:  I have reviewed the patient's current medications. Prior to Admission:  Medications Prior to Admission  Medication Sig Dispense Refill Last Dose  . acetaminophen (TYLENOL) 500 MG tablet Take 500 mg by mouth every 6 (six) hours as needed.    prn at prn  . albumin human 25 % bottle Inject 50 grams no matter what 50 mL 0 Past Month at Unknown time  . alendronate (FOSAMAX) 70 MG tablet Take 70 mg by mouth once a week.   Past Week at Unknown time  . aMILoride (MIDAMOR) 5 MG tablet Take 10 mg by mouth in the morning and at bedtime.   Past Week at Unknown time  . aspirin EC 81 MG tablet Take 81 mg by mouth daily.     Past Week at Unknown time  . furosemide (LASIX) 20 MG tablet Take 20 mg by mouth daily.   Past Week at Unknown time  . lactulose (CHRONULAC) 10 GM/15ML solution Take 20 g by mouth daily.    Past Week at Unknown time  . levofloxacin (LEVAQUIN) 250 MG tablet Take 250 mg by mouth daily.   Past Week at Unknown time  . mirtazapine (REMERON) 15 MG tablet Take 1 tablet (15 mg total) by mouth at bedtime. 90 tablet 1 Past Week at Unknown time  . nitroGLYCERIN (NITROSTAT) 0.4 MG SL tablet Place 0.4 mg under the tongue every 5 (five) minutes as needed for chest pain.    prn at prn  . omeprazole (PRILOSEC) 20 MG capsule Take 20 mg by mouth daily.    Past Week at Unknown time  . ondansetron (ZOFRAN) 4 MG tablet Take 4 mg by mouth daily.   Past Week at unknown  . propranolol (INDERAL) 10 MG tablet Take 10 mg by mouth 2 (two) times daily.   Past Week at Unknown time  . rifaximin (XIFAXAN) 550 MG TABS tablet Take 550 mg by mouth 2 (two) times daily.   Past Week at Unknown time  . sertraline (ZOLOFT) 25 MG tablet Take 25 mg by mouth daily.   Past Week at Unknown time   Scheduled: . sodium chloride   Intravenous Once  . Chlorhexidine Gluconate Cloth  6 each Topical Daily  . feeding supplement  237 mL Oral TID BM  . lactulose  20 g Oral BID  . midodrine  7.5 mg Oral TID WC  . multivitamin with minerals  1 tablet Oral Daily  . octreotide  100 mcg Subcutaneous TID  . rifaximin  550 mg Oral BID  . sodium bicarbonate  650 mg Oral TID  . sodium chloride flush  10-40 mL Intracatheter Q12H   Continuous: . albumin human 50 g (04/29/20 1012)  . cefTRIAXone (ROCEPHIN)  IV 2 g (04/28/20 2310)   FTD:DUKGUR chloride flush Anti-infectives (From admission, onward)   Start     Dose/Rate Route Frequency Ordered Stop   04/27/20 2330  cefTRIAXone (ROCEPHIN) 2 g in sodium chloride 0.9 % 100 mL IVPB        2 g 200 mL/hr over 30 Minutes Intravenous Every 24 hours 04/26/20 2333 05/01/20 2329   04/27/20 2300  azithromycin  (ZITHROMAX) 500 mg in sodium chloride 0.9 % 250 mL  IVPB  Status:  Discontinued        500 mg 250 mL/hr over 60 Minutes Intravenous Every 24 hours 04/26/20 2333 04/27/20 1349   04/27/20 1300  rifaximin (XIFAXAN) tablet 550 mg        550 mg Oral 2 times daily 04/27/20 1204     04/26/20 2230  cefTRIAXone (ROCEPHIN) 2 g in sodium chloride 0.9 % 100 mL IVPB  Status:  Discontinued        2 g 200 mL/hr over 30 Minutes Intravenous Every 24 hours 04/26/20 2222 04/27/20 0020   04/26/20 2230  azithromycin (ZITHROMAX) 500 mg in sodium chloride 0.9 % 250 mL IVPB  Status:  Discontinued        500 mg 250 mL/hr over 60 Minutes Intravenous Every 24 hours 04/26/20 2222 04/27/20 0019     Scheduled Meds: . sodium chloride   Intravenous Once  . Chlorhexidine Gluconate Cloth  6 each Topical Daily  . feeding supplement  237 mL Oral TID BM  . lactulose  20 g Oral BID  . midodrine  7.5 mg Oral TID WC  . multivitamin with minerals  1 tablet Oral Daily  . octreotide  100 mcg Subcutaneous TID  . rifaximin  550 mg Oral BID  . sodium bicarbonate  650 mg Oral TID  . sodium chloride flush  10-40 mL Intracatheter Q12H   Continuous Infusions: . albumin human 50 g (04/29/20 1012)  . cefTRIAXone (ROCEPHIN)  IV 2 g (04/28/20 2310)   PRN Meds:.sodium chloride flush   Assessment: Principal Problem:   Decompensated hepatic cirrhosis, non-alcoholic (HCC) Active Problems:   Essential (primary) hypertension   Thrombocytopenia (HCC)   CAD (coronary artery disease)   Acute renal failure superimposed on stage IIIb chronic kidney disease (HCC)   Acute on chronic hyponatremia   Leukocytosis   CAP (community acquired pneumonia)   Plan: Charmian B Mcneil is a 75 y.o. female with decompensated NASH cirrhosis with ascites, hepatic encephalopathy, history of coronary disease s/p PCI, history of chronic kidney disease is admitted with acute on chronic decompensated cirrhosis of liver leading to worsening of renal function,  worsening of hyponatremia as well as anemia  Decompensated cirrhosis of liver, child class C, meld sodium 33 On ceftriaxone for SBP prophylaxis blood cultures no growth to date, no evidence of UTI based on urine analysis No evidence of active GI bleed to explain anemia at this time. Most likely secondary to decompensated liver disease, continue to monitor Continue lactulose to 20g twice daily, decrease the dose if needed to prevent dehydration Continue rifaximin 550 mg twice daily  AKI on CKD and hyponatremia, likely hepatorenal syndrome, BUN/creatinine continue to deteriorate Hyponatremia is improving Continue albumin 50 g daily Continue midodrine 7.5 mg 3 times daily Continue octreotide 114mg subcut 3 times daily Nephrology is on board, appreciate recommendations  Patient is not a liver transplant candidate due to her overall functional status. Palliative care consultation is appropriate. If patient is not making meaningful recovery during this admission, recommend hospice  Thank you for involving me in the care of this patient.   LOS: 3 days   Deshawn Skelley 04/29/2020, 2:13 PM

## 2020-04-29 NOTE — Progress Notes (Signed)
PT Cancellation Note  Patient Details Name: Shannon Sawyer MRN: [id number] DOB: 1945-08-12   Cancelled Treatment:    Reason Eval/Treat Not Completed: Medical issues which prohibited therapy. Patient has Hgb of 6.8, receiving blood transfusion currently. Will re-attempt at later time/day.     Dodd Schmid 04/29/2020, 1:45 PM

## 2020-04-29 NOTE — Progress Notes (Signed)
[place], [place] 04/29/20  Subjective:   LOS: 3 Shannon Sawyer is a 75 y.o. female with PMHX anemia, carotid artery disease, cirrhosis not a liver transplant candidate, depression, GERD, essential hypertension, shortness of breath, myocardial infarction, and tricuspid insufficiency.  She is currently admitted to the hospital with acute kidney injury on top of chronic kidney disease with hyponatremia.   Patient seen laying in the bed Alert and oriented Breakfast tray at bedside, untouched Denies shortness of breath and chest pain  Currently on room air UOP 3 occurrences recorded  Objective:  Vital signs in last 24 hours:  Temp:  [97.5 F (36.4 C)-98.6 F (37 C)] 97.5 F (36.4 C) (02/25 1412) Pulse Rate:  [64-72] 64 (02/25 1137) Resp:  [14-20] 16 (02/25 1412) BP: (104-118)/(42-52) 118/42 (02/25 1412) SpO2:  [93 %-96 %] 96 % (02/25 1412) Weight:  [65.4 kg] 65.4 kg (02/25 0316)  Weight change: 2.863 kg Filed Weights   04/27/20 0027 04/28/20 0539 04/29/20 0316  Weight: 61.6 kg 62.5 kg 65.4 kg    Intake/Output:    Intake/Output Summary (Last 24 hours) at 04/29/2020 1429 Last data filed at 04/29/2020 1412 Gross per 24 hour  Intake 1122.92 ml  Output -  Net 1122.92 ml    Physical Exam: General:  NAD, resting comfortable  HEENT  within normal limits, moist oral mucosa  Neck:  Neck supple  Lungs:  Decreased breath sounds bilaterally  Heart::  regular rate and rhythm  Abdomen:  Distended with ascites, soft  Extremities:  Trace edema  Neurologic:  Awake and alert oriented x3  Skin:  Skin intact     Basic Metabolic Panel:  Recent Labs  Lab 04/25/20 1401 04/26/20 1601 04/27/20 0415 04/28/20 0545 04/29/20 0520  NA 126* 122* 124*  124* 129* 132*  K 4.0 4.0 4.1 3.8 3.9  CL 97* 93* 96* 99 102  CO2 16* 17* 18* 18* 16*  GLUCOSE 130* 130* 97 103* 123*  BUN 152* 157* 159* 162* 174*  CREATININE 1.94* 2.12* 2.07* 2.25* 2.31*  CALCIUM 9.1  8.8* 8.2* 8.7* 8.6*  MG 2.5*  --   --   --   --   PHOS 4.9*  --   --   --  6.7*     CBC: Recent Labs  Lab 04/25/20 1401 04/26/20 1601 04/27/20 0415 04/28/20 0545 04/29/20 0530  WBC 14.8* 16.4* 15.8* 15.3* 12.2*  NEUTROABS  --   --  13.5*  --   --   HGB 8.9* 8.0* 7.4* 7.6* 6.8*  HCT 24.3* 22.8* 20.8* 20.5* 18.7*  MCV 104.3* 105.6* 105.6* 102.5* 103.9*  PLT 45* 45* 37* 45* 44*     No results found for: HEPBSAG, HEPBSAB, HEPBIGM    Microbiology:  Recent Results (from the past 240 hour(s))  Urine Culture     Status: Abnormal   Collection Time: 04/26/20  4:01 PM   Specimen: Urine, Random  Result Value Ref Range Status   Specimen Description   Final    URINE, RANDOM Performed at [facility], [address], [place], [place] [address]    Special Requests   Final    NONE Performed at [facility], [address] [place], [place] [address]    Culture MULTIPLE SPECIES PRESENT, SUGGEST RECOLLECTION (A)  Final   Report Status 04/29/2020 FINAL  Final  Resp Panel by RT-PCR (Flu A&B, Covid) Nasopharyngeal Swab     Status: None   Collection Time: 04/26/20  8:54 PM  Specimen: Nasopharyngeal Swab; Nasopharyngeal(NP) swabs in vial transport medium  Result Value Ref Range Status   SARS Coronavirus 2 by RT PCR NEGATIVE NEGATIVE Final    Comment: (NOTE) SARS-CoV-2 target nucleic acids are NOT DETECTED.  The SARS-CoV-2 RNA is generally detectable in upper respiratory specimens during the acute phase of infection. The lowest concentration of SARS-CoV-2 viral copies this assay can detect is 138 copies/mL. A negative result does not preclude SARS-Cov-2 infection and should not be used as the sole basis for treatment or other patient management decisions. A negative result may occur with  improper specimen collection/handling, submission of specimen other than nasopharyngeal swab, presence of viral mutation(s) within the areas targeted by this assay, and  inadequate number of viral copies(<138 copies/mL). A negative result must be combined with clinical observations, patient history, and epidemiological information. The expected result is Negative.  Fact Sheet for Patients:  EntrepreneurPulse.com.au  Fact Sheet for Healthcare Providers:  IncredibleEmployment.be  This test is no t yet approved or cleared by the [place] FDA and  has been authorized for detection and/or diagnosis of SARS-CoV-2 by FDA under an Emergency Use Authorization (EUA). This EUA will remain  in effect (meaning this test can be used) for the duration of the COVID-19 declaration under Section 564(b)(1) of the Act, 21 U.S.C.section 360bbb-3(b)(1), unless the authorization is terminated  or revoked sooner.       Influenza A by PCR NEGATIVE NEGATIVE Final   Influenza B by PCR NEGATIVE NEGATIVE Final    Comment: (NOTE) The Xpert Xpress SARS-CoV-2/FLU/RSV plus assay is intended as an aid in the diagnosis of influenza from Nasopharyngeal swab specimens and should not be used as a sole basis for treatment. Nasal washings and aspirates are unacceptable for Xpert Xpress SARS-CoV-2/FLU/RSV testing.  Fact Sheet for Patients: EntrepreneurPulse.com.au  Fact Sheet for Healthcare Providers: IncredibleEmployment.be  This test is not yet approved or cleared by the [place] FDA and has been authorized for detection and/or diagnosis of SARS-CoV-2 by FDA under an Emergency Use Authorization (EUA). This EUA will remain in effect (meaning this test can be used) for the duration of the COVID-19 declaration under Section 564(b)(1) of the Act, 21 U.S.C. section 360bbb-3(b)(1), unless the authorization is terminated or revoked.  Performed at [facility], [address] [place], [place] [address]   CULTURE, BLOOD (ROUTINE X 2) w Reflex to ID Panel     Status: None (Preliminary result)    Collection Time: 04/27/20  1:10 PM   Specimen: BLOOD  Result Value Ref Range Status   Specimen Description BLOOD LEFT ANTECUBITAL  Final   Special Requests   Final    BOTTLES DRAWN AEROBIC AND ANAEROBIC Blood Culture adequate volume   Culture   Final    NO GROWTH 2 DAYS Performed at [facility], [address] [place], [place] [address]    Report Status PENDING  Incomplete  CULTURE, BLOOD (ROUTINE X 2) w Reflex to ID Panel     Status: None (Preliminary result)   Collection Time: 04/27/20  1:11 PM   Specimen: BLOOD  Result Value Ref Range Status   Specimen Description BLOOD RIGHT ANTECUBITAL  Final   Special Requests   Final    BOTTLES DRAWN AEROBIC AND ANAEROBIC Blood Culture results may not be optimal due to an excessive volume of blood received in culture bottles   Culture   Final    NO GROWTH 2 DAYS Performed at [facility], [address] [place],  [place] [address]    Report Status PENDING  Incomplete    Coagulation Studies: Recent Labs    04/27/20 0909  LABPROT 23.1*  INR 2.1*    Urinalysis: Recent Labs    04/26/20 1601  COLORURINE YELLOW*  LABSPEC 1.012  PHURINE 5.0  GLUCOSEU NEGATIVE  HGBUR NEGATIVE  BILIRUBINUR NEGATIVE  KETONESUR NEGATIVE  PROTEINUR NEGATIVE  NITRITE NEGATIVE  LEUKOCYTESUR MODERATE*      Imaging: No results found.   Medications:   . albumin human 50 g (04/29/20 1012)  . cefTRIAXone (ROCEPHIN)  IV 2 g (04/28/20 2310)   . sodium chloride   Intravenous Once  . Chlorhexidine Gluconate Cloth  6 each Topical Daily  . feeding supplement  237 mL Oral TID BM  . lactulose  20 g Oral BID  . midodrine  7.5 mg Oral TID WC  . multivitamin with minerals  1 tablet Oral Daily  . octreotide  100 mcg Subcutaneous TID  . rifaximin  550 mg Oral BID  . sodium bicarbonate  650 mg Oral TID  . sodium chloride flush  10-40 mL Intracatheter Q12H   sodium chloride flush  Assessment/ Plan:  74 y.o. female with PMHX chronic  kidney disease stage 4, anemia, carotid artery disease, cirrhosis not a liver transplant candidate, depression, GERD, essential hypertension, shortness of breath, myocardial infarction, and tricuspid insufficiency. was admitted on 04/26/2020 for  Principal Problem:   Decompensated hepatic cirrhosis, non-alcoholic (HCC) Active Problems:   Essential (primary) hypertension   Thrombocytopenia (HCC)   CAD (coronary artery disease)   Acute renal failure superimposed on stage IIIb chronic kidney disease (HCC)   Acute on chronic hyponatremia   Leukocytosis   CAP (community acquired pneumonia)  Hyponatremia [E87.1] Pleural effusion [J90] Elevated bilirubin [R17] Decompensated hepatic cirrhosis ([place]) [K72.90, K74.60]  #. Hyponatremia Acute kidney injury on top of chronic kidney disease stage IV (creatinine 1.71/GFR 29) from 06/16/2019 -Likely due to to overdiuresis -Isotonic saline IV fluids given since admission, these fluids are now [place] -Patient placed on a 1 L/day fluid restriction -Continued improved sodium level 132 -Discussed with patient her worsening kidney function and the possible need for dialysis. She would like to discuss this with her husband.   #. Anemia of CKD  Lab Results  Component Value Date   HGB 6.8 (L) 04/29/2020  -Albumin prescribed daily -1 unit PRBC's ordered   LOS: [place] 2/25/20222:29 PM  [facility] [place], [employer]

## 2020-04-29 NOTE — Progress Notes (Signed)
Mobility Specialist - Progress Note   04/29/20 1400  Mobility  Activity Contraindicated/medical hold  Mobility performed by Mobility specialist    Pt with medical issues prohibiting mobility. Pt with most recent HgB of 6.8, sitting outside safety guidelines for exertion. Will attempt session another date/time as appropriate.    Kathee Delton Mobility Specialist 04/29/20, 2:06 PM

## 2020-04-30 DIAGNOSIS — Z7189 Other specified counseling: Secondary | ICD-10-CM | POA: Diagnosis not present

## 2020-04-30 DIAGNOSIS — Z66 Do not resuscitate: Secondary | ICD-10-CM | POA: Diagnosis not present

## 2020-04-30 DIAGNOSIS — K7290 Hepatic failure, unspecified without coma: Secondary | ICD-10-CM | POA: Diagnosis not present

## 2020-04-30 DIAGNOSIS — Z515 Encounter for palliative care: Secondary | ICD-10-CM | POA: Diagnosis not present

## 2020-04-30 LAB — TYPE AND SCREEN
ABO/RH(D): O POS
Antibody Screen: NEGATIVE
Unit division: 0
Unit division: 0

## 2020-04-30 LAB — BPAM RBC
Blood Product Expiration Date: 202203042359
Blood Product Expiration Date: 202203232359
ISSUE DATE / TIME: 202202251108
ISSUE DATE / TIME: 202202251703
Unit Type and Rh: 5100
Unit Type and Rh: 5100

## 2020-04-30 LAB — COMPREHENSIVE METABOLIC PANEL
ALT: 26 U/L (ref 0–44)
AST: 62 U/L — ABNORMAL HIGH (ref 15–41)
Albumin: 4 g/dL (ref 3.5–5.0)
Alkaline Phosphatase: 50 U/L (ref 38–126)
Anion gap: 13 (ref 5–15)
BUN: 166 mg/dL — ABNORMAL HIGH (ref 8–23)
CO2: 18 mmol/L — ABNORMAL LOW (ref 22–32)
Calcium: 8.2 mg/dL — ABNORMAL LOW (ref 8.9–10.3)
Chloride: 101 mmol/L (ref 98–111)
Creatinine, Ser: 2.39 mg/dL — ABNORMAL HIGH (ref 0.44–1.00)
GFR, Estimated: 21 mL/min — ABNORMAL LOW (ref >60)
Glucose, Bld: 111 mg/dL — ABNORMAL HIGH (ref 70–99)
Potassium: 3.8 mmol/L (ref 3.5–5.1)
Sodium: 132 mmol/L — ABNORMAL LOW (ref 135–145)
Total Bilirubin: 9.5 mg/dL — ABNORMAL HIGH (ref 0.3–1.2)
Total Protein: 6.6 g/dL (ref 6.5–8.1)

## 2020-04-30 LAB — CBC
HCT: 27.6 % — ABNORMAL LOW (ref 36.0–46.0)
Hemoglobin: 10 g/dL — ABNORMAL LOW (ref 12.0–15.0)
MCH: 35 pg — ABNORMAL HIGH (ref 26.0–34.0)
MCHC: 36.2 g/dL — ABNORMAL HIGH (ref 30.0–36.0)
MCV: 96.5 fL (ref 80.0–100.0)
Platelets: 40 10*3/uL — ABNORMAL LOW (ref 150–400)
RBC: 2.86 MIL/uL — ABNORMAL LOW (ref 3.87–5.11)
RDW: 19.6 % — ABNORMAL HIGH (ref 11.5–15.5)
WBC: 9.8 10*3/uL (ref 4.0–10.5)
nRBC: 0 % (ref 0.0–0.2)

## 2020-04-30 LAB — PREPARE RBC (CROSSMATCH)

## 2020-04-30 LAB — PROTIME-INR
INR: 2.3 — ABNORMAL HIGH (ref 0.8–1.2)
Prothrombin Time: 24.7 seconds — ABNORMAL HIGH (ref 11.4–15.2)

## 2020-04-30 MED ORDER — ALBUMIN HUMAN 25 % IV SOLN
50.0000 g | Freq: Every day | INTRAVENOUS | Status: DC
  Administered 2020-04-30 – 2020-05-01 (×2): 50 g via INTRAVENOUS
  Filled 2020-04-30 (×2): qty 200

## 2020-04-30 MED ORDER — IPRATROPIUM-ALBUTEROL 0.5-2.5 (3) MG/3ML IN SOLN
3.0000 mL | RESPIRATORY_TRACT | Status: DC | PRN
  Administered 2020-05-01 – 2020-05-02 (×3): 3 mL via RESPIRATORY_TRACT
  Filled 2020-04-30 (×3): qty 3

## 2020-04-30 NOTE — Progress Notes (Addendum)
[place], [place] 04/30/20  Subjective:   LOS: 4 Shannon Sawyer is a 76 y.o. female with PMHX anemia, carotid artery disease, cirrhosis not a liver transplant candidate, depression, GERD, essential hypertension, shortness of breath, myocardial infarction, and tricuspid insufficiency.  She is currently admitted to the hospital with acute kidney injury on top of chronic kidney disease with hyponatremia.   Patient laying comfortably in the bed.  Patient voices no new concerns. Patient has been present in the room as well. Patient has been voices no new physical concerns.  Patient husband does express his thought process of going to hospice care in near future.  Patient family understands the poor prognosis.  I empathized with the patient and the family   Objective:  Vital signs in last 24 hours:  Temp:  [97.5 F (36.4 C)-98.1 F (36.7 C)] 98 F (36.7 C) (02/26 1140) Pulse Rate:  [60-70] 64 (02/26 1140) Resp:  [12-22] 16 (02/26 1140) BP: (112-130)/(42-60) 121/56 (02/26 1140) SpO2:  [91 %-96 %] 95 % (02/26 1140) Weight:  [65.8 kg] 65.8 kg (02/26 0300)  Weight change: 0.454 kg Filed Weights   04/28/20 0539 04/29/20 0316 04/30/20 0300  Weight: 62.5 kg 65.4 kg 65.8 kg    Intake/Output:    Intake/Output Summary (Last 24 hours) at 04/30/2020 1251 Last data filed at 04/30/2020 0815 Gross per 24 hour  Intake 1552.69 ml  Output 2 ml  Net 1550.69 ml    Physical Exam: General:  NAD, resting comfortable  HEENT  within normal limits, moist oral mucosa  Neck:  Neck supple  Lungs:  Decreased breath sounds bilaterally  Heart::  regular rate and rhythm  Abdomen:  Distended with ascites, soft  Extremities:  1+edema  Neurologic:  Awake and alert oriented x3  Skin:  Skin intact     Basic Metabolic Panel:  Recent Labs  Lab 04/25/20 1401 04/26/20 1601 04/27/20 0415 04/28/20 0545 04/29/20 0520 04/30/20 0951  NA 126* 122* 124*  124* 129* 132* 132*  K  4.0 4.0 4.1 3.8 3.9 3.8  CL 97* 93* 96* 99 102 101  CO2 16* 17* 18* 18* 16* 18*  GLUCOSE 130* 130* 97 103* 123* 111*  BUN 152* 157* 159* 162* 174* 166*  CREATININE 1.94* 2.12* 2.07* 2.25* 2.31* 2.39*  CALCIUM 9.1 8.8* 8.2* 8.7* 8.6* 8.2*  MG 2.5*  --   --   --   --   --   PHOS 4.9*  --   --   --  6.7*  --      CBC: Recent Labs  Lab 04/27/20 0415 04/28/20 0545 04/29/20 0530 04/29/20 1504 04/29/20 2224 04/30/20 0951  WBC 15.8* 15.3* 12.2* 10.1  --  9.8  NEUTROABS 13.5*  --   --  8.2*  --   --   HGB 7.4* 7.6* 6.8* 8.0* 9.2* 10.0*  HCT 20.8* 20.5* 18.7* 22.9* 26.6* 27.6*  MCV 105.6* 102.5* 103.9* 102.2*  --  96.5  PLT 37* 45* 44* 38*  --  40*     No results found for: HEPBSAG, HEPBSAB, HEPBIGM    Microbiology:  Recent Results (from the past 240 hour(s))  Urine Culture     Status: Abnormal   Collection Time: 04/26/20  4:01 PM   Specimen: Urine, Random  Result Value Ref Range Status   Specimen Description   Final    URINE, RANDOM Performed at [facility], [address] [place], [place] [address]    Special Requests  Final    NONE Performed at [facility], [address] [place], [place] [address]    Culture MULTIPLE SPECIES PRESENT, SUGGEST RECOLLECTION (A)  Final   Report Status 04/29/2020 FINAL  Final  Resp Panel by RT-PCR (Flu A&B, Covid) Nasopharyngeal Swab     Status: None   Collection Time: 04/26/20  8:54 PM   Specimen: Nasopharyngeal Swab; Nasopharyngeal(NP) swabs in vial transport medium  Result Value Ref Range Status   SARS Coronavirus 2 by RT PCR NEGATIVE NEGATIVE Final    Comment: (NOTE) SARS-CoV-2 target nucleic acids are NOT DETECTED.  The SARS-CoV-2 RNA is generally detectable in upper respiratory specimens during the acute phase of infection. The lowest concentration of SARS-CoV-2 viral copies this assay can detect is 138 copies/mL. A negative result does not preclude SARS-Cov-2 infection and should not be used as the  sole basis for treatment or other patient management decisions. A negative result may occur with  improper specimen collection/handling, submission of specimen other than nasopharyngeal swab, presence of viral mutation(s) within the areas targeted by this assay, and inadequate number of viral copies(<138 copies/mL). A negative result must be combined with clinical observations, patient history, and epidemiological information. The expected result is Negative.  Fact Sheet for Patients:  EntrepreneurPulse.com.au  Fact Sheet for Healthcare Providers:  IncredibleEmployment.be  This test is no t yet approved or cleared by the [place] FDA and  has been authorized for detection and/or diagnosis of SARS-CoV-2 by FDA under an Emergency Use Authorization (EUA). This EUA will remain  in effect (meaning this test can be used) for the duration of the COVID-19 declaration under Section 564(b)(1) of the Act, 21 U.S.C.section 360bbb-3(b)(1), unless the authorization is terminated  or revoked sooner.       Influenza A by PCR NEGATIVE NEGATIVE Final   Influenza B by PCR NEGATIVE NEGATIVE Final    Comment: (NOTE) The Xpert Xpress SARS-CoV-2/FLU/RSV plus assay is intended as an aid in the diagnosis of influenza from Nasopharyngeal swab specimens and should not be used as a sole basis for treatment. Nasal washings and aspirates are unacceptable for Xpert Xpress SARS-CoV-2/FLU/RSV testing.  Fact Sheet for Patients: EntrepreneurPulse.com.au  Fact Sheet for Healthcare Providers: IncredibleEmployment.be  This test is not yet approved or cleared by the [place] FDA and has been authorized for detection and/or diagnosis of SARS-CoV-2 by FDA under an Emergency Use Authorization (EUA). This EUA will remain in effect (meaning this test can be used) for the duration of the COVID-19 declaration under Section 564(b)(1) of the  Act, 21 U.S.C. section 360bbb-3(b)(1), unless the authorization is terminated or revoked.  Performed at [facility], [address] [place], [place] [address]   CULTURE, BLOOD (ROUTINE X 2) w Reflex to ID Panel     Status: None (Preliminary result)   Collection Time: 04/27/20  1:10 PM   Specimen: BLOOD  Result Value Ref Range Status   Specimen Description BLOOD LEFT ANTECUBITAL  Final   Special Requests   Final    BOTTLES DRAWN AEROBIC AND ANAEROBIC Blood Culture adequate volume   Culture   Final    NO GROWTH 3 DAYS Performed at [facility], [address] [place], [place] [address]    Report Status PENDING  Incomplete  CULTURE, BLOOD (ROUTINE X 2) w Reflex to ID Panel     Status: None (Preliminary result)   Collection Time: 04/27/20  1:11 PM   Specimen: BLOOD  Result Value Ref Range Status  Specimen Description BLOOD RIGHT ANTECUBITAL  Final   Special Requests   Final    BOTTLES DRAWN AEROBIC AND ANAEROBIC Blood Culture results may not be optimal due to an excessive volume of blood received in culture bottles   Culture   Final    NO GROWTH 3 DAYS Performed at [facility], [address] [place], [place] [address]    Report Status PENDING  Incomplete    Coagulation Studies: Recent Labs    04/30/20 0951  LABPROT 24.7*  INR 2.3*    Urinalysis: No results for input(s): COLORURINE, LABSPEC, PHURINE, GLUCOSEU, HGBUR, BILIRUBINUR, KETONESUR, PROTEINUR, UROBILINOGEN, NITRITE, LEUKOCYTESUR in the last 72 hours.  Invalid input(s): APPERANCEUR    Imaging: No results found.   Medications:   . albumin human    . cefTRIAXone (ROCEPHIN)  IV Stopped (04/30/20 0000)   . sodium chloride   Intravenous Once  . sodium chloride   Intravenous Once  . Chlorhexidine Gluconate Cloth  6 each Topical Daily  . feeding supplement  237 mL Oral TID BM  . lactulose  20 g Oral BID  . midodrine  7.5 mg Oral TID WC  . multivitamin with minerals  1  tablet Oral Daily  . octreotide  100 mcg Subcutaneous TID  . rifaximin  550 mg Oral BID  . sodium bicarbonate  650 mg Oral TID  . sodium chloride flush  10-40 mL Intracatheter Q12H   ipratropium-albuterol, sodium chloride flush  Assessment/ Plan:  75 y.o. female with PMHX chronic kidney disease stage 4, anemia, carotid artery disease, cirrhosis not a liver transplant candidate, depression, GERD, essential hypertension, shortness of breath, myocardial infarction, and tricuspid insufficiency. was admitted on 04/26/2020 for  Principal Problem:   Decompensated hepatic cirrhosis, non-alcoholic (HCC) Active Problems:   Essential (primary) hypertension   Thrombocytopenia (HCC)   CAD (coronary artery disease)   Acute renal failure superimposed on stage IIIb chronic kidney disease (HCC)   Acute on chronic hyponatremia   Leukocytosis   CAP (community acquired pneumonia)  Hyponatremia [E87.1] Pleural effusion [J90] Elevated bilirubin [R17] Decompensated hepatic cirrhosis ([place]) [K72.90, K74.60]      1)Renal    Acute kidney injury Patient has AKI on CKD Patient AKI is multifactorial Patient AKI could be from ATN versus hepatorenal syndrome -Patient AKI is worsening slowly Patient has CKD stage IV from his baseline -Patient family understands poor prognosis and does not wish for aggressive treatment such as dialysis   2) hypotension Patient is on midodrine Blood pressure is stable    3)Anemia of chronic disease  CBC Latest Ref Rng & Units 04/30/2020 04/29/2020 04/29/2020  WBC 4.0 - 10.5 K/uL 9.8 - 10.1  Hemoglobin 12.0 - 15.0 g/dL 10.0(L) 9.2(L) 8.0(L)  Hematocrit 36.0 - 46.0 % 27.6(L) 26.6(L) 22.9(L)  Platelets 150 - 400 K/uL 40(L) - 38(L)       HGb at goal (9--11)   4) Secondary hyperparathyroidism -CKD Mineral-Bone Disorder    Lab Results  Component Value Date   CALCIUM 8.2 (L) 04/30/2020   PHOS 6.7 (H) 04/29/2020    Secondary Hyperparathyroidism  present Phosphorus is not at goal.   5) decompensated nonalcoholic hepatic cirrhosis Patient is not a candidate for liver transplant GI and primary team are following    6) Electrolytes   BMP Latest Ref Rng & Units 04/30/2020 04/29/2020 04/28/2020  Glucose 70 - 99 mg/dL 111(H) 123(H) 103(H)  BUN 8 - 23 mg/dL 166(H) 174(H) 162(H)  Creatinine 0.44 - 1.00 mg/dL 2.39(H) 2.31(H) 2.25(H)  BUN/Creat Ratio 12 - 28 - - -  Sodium 135 - 145 mmol/L 132(L) 132(L) 129(L)  Potassium 3.5 - 5.1 mmol/L 3.8 3.9 3.8  Chloride 98 - 111 mmol/L 101 102 99  CO2 22 - 32 mmol/L 18(L) 16(L) 18(L)  Calcium 8.9 - 10.3 mg/dL 8.2(L) 8.6(L) 8.7(L)     Sodium Hyponatremia Now better   Potassium Normokalemic    7)Acid base    Co2 at goal    Plan  We will continue patient on IV albumin, midodrine and octreotide Educated patient and family about the poor prognosis Patient and family voiced understanding   LOS: [place] 2/26/202212:51 PM  [place], [place]

## 2020-04-30 NOTE — Progress Notes (Signed)
PROGRESS NOTE    Shannon Sawyer  XYI:[id number] DOB: April 22, 1945 DOA: 04/26/2020 PCP: Gladstone Lighter, MD   Brief Narrative: Taken from H&P. Shannon Sawyer is a 75 y.o. female with medical history significant for CAD, HTN, depression, CKD IIIb, nonalcoholic liver cirrhosis, not a liver transplant candidate, who was recently referred to palliative care who was sent in by GI for evaluation of altered mental status and concern for labs. Patient is having worsening renal function and her home dose of Levaquin which she was taking for SBP prophylaxis was held. Patient with worsening liver functions, hyperbilirubinemia, hyponatremia, thrombocytopenia, leukocytosis which seems chronic and anemia. Ultrasound with mild ascites.  Procalcitonin elevated at 0.88 Started on ceftriaxone for concern of SBP. Worsening meld-Na score of 34 and class C child Pugh score, mortality of 65-66% in next 3 months.  Subjective: Patient appears very lethargic and somnolent.  Appetite remains very poor.  No nausea or vomiting.  Assessment & Plan:   Principal Problem:   Decompensated hepatic cirrhosis, non-alcoholic (HCC) Active Problems:   Essential (primary) hypertension   Thrombocytopenia (HCC)   CAD (coronary artery disease)   Acute renal failure superimposed on stage IIIb chronic kidney disease (HCC)   Acute on chronic hyponatremia   Leukocytosis   CAP (community acquired pneumonia)  Decompensated nonalcoholic hepatic cirrhosis. With class C liver cirrhosis and worsening meld-Na score at 34 which makes her with very increased risk of mortality, 65-66% in the next 21-month Worsening liver functions and failure. Patient also developed hepatorenal syndrome with worsening renal function. Exam positive for flapping tremors and marked scleral icterus, Patient was on Levaquin for SBP prophylaxis which was discontinued recently due to worsening renal function. She was started on ceftriaxone and Zithromax, chest  x-ray with infiltrate/atelectasis, most likely atelectasis as there is no significant upper respiratory symptoms.  Patient does has leukocytosis and elevated procalcitonin.  Leukocytosis seems chronic. Not a candidate for liver transplant. -GI started her on albumin and octreotide. -Midodrine dose was increased to 7.5 mg 3 times daily at GI request, with some improvement in blood pressure. -Continue with lactulose and rifaximin. -Continue with ceftriaxone-to complete a 5-day course. -Continue with supportive care. -Palliative care consult-patient is very high risk for deterioration and death, ongoing discussion, patient was made DNR. -Husband is leaning more toward hospice care after discussing with nephrology as patient is not a good candidate for dialysis.  Leukocytosis.  Seems chronic per chart review.  Procalcitonin elevated at 0.88 but that is unreliable with CKD. Chest x-ray with some concern of infiltrate/atelectasis but no upper respiratory symptoms, less likely pneumonia.  UA with some leukocytes. She was started on Zithromax and ceftriaxone for a possible CAPl, Zithromax was discontinued.  Blood and urine cultures remain negative. -Continue with ceftriaxone as it will cover for any SBP-complete a 5-day course. -Can be an underlying hematologic abnormality secondary to liver disease.  Acute on chronic hyponatremia.  Most likely secondary to liver disease.  Urinary sodium less than 10, elevated serum osmolality at 324. Baseline sodium around 126-130.  Patient did received some IV fluid. Patient was on Zoloft and Lasix at home which can be contributory. Nephrology was consulted at the request of GI. Sodium stable at 132 with fluid restriction today. -Keep holding Zoloft and Lasix -Monitor sodium  Thrombocytopenia.  Most likely secondary to liver disease. Platelets at 44 today.  No obvious bleeding, multiple bruising involving extremities. -Continue to monitor.  Anemia.  Improved to  10 after receiving 2 units of PRBC.  No obvious bleeding and stool occult card was negative.  Anemia panel consistent with anemia of chronic disease, most likely secondary to liver and kidney disease. -Continue to monitor -Transfuse if below 7  AKI with CKD stage IIIb.  Worsening creatinine and BUN.  Most likely secondary to hepatorenal syndrome and poor p.o. intake. Increased somnolence most likely secondary to azotemia. -Nephrology was consulted-we will appreciate their recommendations. -Continue with midodrine to avoid hypotension, dose increased on 2/24.. -Monitor renal function -Avoid nephrotoxins  Hypertension.  Blood pressure within goal today. -Patient is on midodrine. -Keep holding home antihypertensives  CAD.  No chest pain and EKG without any acute changes. -Continue aspirin. -Holding propranolol due to softer blood pressure, can be resumed if blood pressure improves.   Objective: Vitals:   04/30/20 0300 04/30/20 0351 04/30/20 0823 04/30/20 1140  BP:  (!) 127/55 130/60 (!) 121/56  Pulse:  67 70 64  Resp:  (!) 22 18 16   Temp:  (!) 97.5 F (36.4 C) 98 F (36.7 C) 98 F (36.7 C)  TempSrc:  Oral Oral   SpO2:  94% 92% 95%  Weight: 65.8 kg     Height:        Intake/Output Summary (Last 24 hours) at 04/30/2020 1528 Last data filed at 04/30/2020 1330 Gross per 24 hour  Intake 1179.77 ml  Output 2 ml  Net 1177.77 ml   Filed Weights   04/28/20 0539 04/29/20 0316 04/30/20 0300  Weight: 62.5 kg 65.4 kg 65.8 kg    Examination:  General.  Lethargic and somnolent elderly lady, in no acute distress, scleral icterus which seems worsening. Pulmonary.  Lungs clear bilaterally, normal respiratory effort. CV.  Regular rate and rhythm, no JVD, rub or murmur. Abdomen.  Soft, nontender, nondistended, BS positive. CNS.  Alert and oriented x3.  No focal neurologic deficit. Extremities.  No edema, no cyanosis, pulses intact and symmetrical. Psychiatry.  Judgment and insight  appears impaired.  DVT prophylaxis: SCDs Code Status: DNR Family Communication: Discussed with husband on phone. Disposition Plan:  Status is: Inpatient  Remains inpatient appropriate because:Inpatient level of care appropriate due to severity of illness   Dispo: The patient is from: Home              Anticipated d/c is to: To be determined              Anticipated d/c date is: 2 days              Patient currently is not medically stable to d/c.   Difficult to place patient No              Level of care: Progressive Cardiac  Patient is very high risk for deterioration and death.  Husband is leaning toward hospice care but no final decision yet.  All the records are reviewed and case discussed with Care Management/Social Worker. Management plans discussed with the patient, nursing and they are in agreement.  Consultants:   GI  Nephrology  Palliative care  Procedures:  Antimicrobials:  Ceftriaxone  Data Reviewed: I have personally reviewed following labs and imaging studies  CBC: Recent Labs  Lab 04/27/20 0415 04/28/20 0545 04/29/20 0530 04/29/20 1504 04/29/20 2224 04/30/20 0951  WBC 15.8* 15.3* 12.2* 10.1  --  9.8  NEUTROABS 13.5*  --   --  8.2*  --   --   HGB 7.4* 7.6* 6.8* 8.0* 9.2* 10.0*  HCT 20.8* 20.5* 18.7* 22.9* 26.6* 27.6*  MCV 105.6* 102.5* 103.9*  102.2*  --  96.5  PLT 37* 45* 44* 38*  --  40*   Basic Metabolic Panel: Recent Labs  Lab 04/25/20 1401 04/26/20 1601 04/27/20 0415 04/28/20 0545 04/29/20 0520 04/30/20 0951  NA 126* 122* 124*  124* 129* 132* 132*  K 4.0 4.0 4.1 3.8 3.9 3.8  CL 97* 93* 96* 99 102 101  CO2 16* 17* 18* 18* 16* 18*  GLUCOSE 130* 130* 97 103* 123* 111*  BUN 152* 157* 159* 162* 174* 166*  CREATININE 1.94* 2.12* 2.07* 2.25* 2.31* 2.39*  CALCIUM 9.1 8.8* 8.2* 8.7* 8.6* 8.2*  MG 2.5*  --   --   --   --   --   PHOS 4.9*  --   --   --  6.7*  --    GFR: Estimated Creatinine Clearance: 17.9 mL/min (A) (by C-G formula  based on SCr of 2.39 mg/dL (H)). Liver Function Tests: Recent Labs  Lab 04/25/20 1401 04/26/20 1601 04/27/20 0415 04/28/20 0545 04/29/20 0520 04/30/20 0951  AST 110* 106* 92* 88*  --  62*  ALT 41 39 36 33  --  26  ALKPHOS 110 116 96 77  --  50  BILITOT 7.0* 8.0* 7.6* 7.2*  --  9.5*  PROT 7.3 7.2 6.7 7.2  --  6.6  ALBUMIN 3.7 3.6 3.3* 4.0 4.0 4.0   No results for input(s): LIPASE, AMYLASE in the last 168 hours. Recent Labs  Lab 04/26/20 2054  AMMONIA 44*   Coagulation Profile: Recent Labs  Lab 04/27/20 0909 04/30/20 0951  INR 2.1* 2.3*   Cardiac Enzymes: No results for input(s): CKTOTAL, CKMB, CKMBINDEX, TROPONINI in the last 168 hours. BNP (last 3 results) No results for input(s): PROBNP in the last 8760 hours. HbA1C: No results for input(s): HGBA1C in the last 72 hours. CBG: Recent Labs  Lab 04/29/20 0829  GLUCAP 119*   Lipid Profile: No results for input(s): CHOL, HDL, LDLCALC, TRIG, CHOLHDL, LDLDIRECT in the last 72 hours. Thyroid Function Tests: No results for input(s): TSH, T4TOTAL, FREET4, T3FREE, THYROIDAB in the last 72 hours. Anemia Panel: Recent Labs    04/28/20 0545  FOLATE 39.0  FERRITIN 194  TIBC 227*  IRON 29  RETICCTPCT 5.7*   Sepsis Labs: Recent Labs  Lab 04/26/20 2054 04/27/20 0415  PROCALCITON  --  0.88  LATICACIDVEN 1.7  --     Recent Results (from the past 240 hour(s))  Urine Culture     Status: Abnormal   Collection Time: 04/26/20  4:01 PM   Specimen: Urine, Random  Result Value Ref Range Status   Specimen Description   Final    URINE, RANDOM Performed at [facility], [address] [place], [place] [address]    Special Requests   Final    NONE Performed at [facility], [address] [place], [place] [address]    Culture MULTIPLE SPECIES PRESENT, SUGGEST RECOLLECTION (A)  Final   Report Status 04/29/2020 FINAL  Final  Resp Panel by RT-PCR (Flu A&B, Covid) Nasopharyngeal Swab     Status: None    Collection Time: 04/26/20  8:54 PM   Specimen: Nasopharyngeal Swab; Nasopharyngeal(NP) swabs in vial transport medium  Result Value Ref Range Status   SARS Coronavirus 2 by RT PCR NEGATIVE NEGATIVE Final    Comment: (NOTE) SARS-CoV-2 target nucleic acids are NOT DETECTED.  The SARS-CoV-2 RNA is generally detectable in upper respiratory specimens during the acute phase of infection. The lowest concentration of SARS-CoV-2  viral copies this assay can detect is 138 copies/mL. A negative result does not preclude SARS-Cov-2 infection and should not be used as the sole basis for treatment or other patient management decisions. A negative result may occur with  improper specimen collection/handling, submission of specimen other than nasopharyngeal swab, presence of viral mutation(s) within the areas targeted by this assay, and inadequate number of viral copies(<138 copies/mL). A negative result must be combined with clinical observations, patient history, and epidemiological information. The expected result is Negative.  Fact Sheet for Patients:  EntrepreneurPulse.com.au  Fact Sheet for Healthcare Providers:  IncredibleEmployment.be  This test is no t yet approved or cleared by the [place] FDA and  has been authorized for detection and/or diagnosis of SARS-CoV-2 by FDA under an Emergency Use Authorization (EUA). This EUA will remain  in effect (meaning this test can be used) for the duration of the COVID-19 declaration under Section 564(b)(1) of the Act, 21 U.S.C.section 360bbb-3(b)(1), unless the authorization is terminated  or revoked sooner.       Influenza A by PCR NEGATIVE NEGATIVE Final   Influenza B by PCR NEGATIVE NEGATIVE Final    Comment: (NOTE) The Xpert Xpress SARS-CoV-2/FLU/RSV plus assay is intended as an aid in the diagnosis of influenza from Nasopharyngeal swab specimens and should not be used as a sole basis for treatment.  Nasal washings and aspirates are unacceptable for Xpert Xpress SARS-CoV-2/FLU/RSV testing.  Fact Sheet for Patients: EntrepreneurPulse.com.au  Fact Sheet for Healthcare Providers: IncredibleEmployment.be  This test is not yet approved or cleared by the [place] FDA and has been authorized for detection and/or diagnosis of SARS-CoV-2 by FDA under an Emergency Use Authorization (EUA). This EUA will remain in effect (meaning this test can be used) for the duration of the COVID-19 declaration under Section 564(b)(1) of the Act, 21 U.S.C. section 360bbb-3(b)(1), unless the authorization is terminated or revoked.  Performed at [facility], [address] [place], [place] [address]   CULTURE, BLOOD (ROUTINE X 2) w Reflex to ID Panel     Status: None (Preliminary result)   Collection Time: 04/27/20  1:10 PM   Specimen: BLOOD  Result Value Ref Range Status   Specimen Description BLOOD LEFT ANTECUBITAL  Final   Special Requests   Final    BOTTLES DRAWN AEROBIC AND ANAEROBIC Blood Culture adequate volume   Culture   Final    NO GROWTH 3 DAYS Performed at [facility], [address], [place], [place] [address]    Report Status PENDING  Incomplete  CULTURE, BLOOD (ROUTINE X 2) w Reflex to ID Panel     Status: None (Preliminary result)   Collection Time: 04/27/20  1:11 PM   Specimen: BLOOD  Result Value Ref Range Status   Specimen Description BLOOD RIGHT ANTECUBITAL  Final   Special Requests   Final    BOTTLES DRAWN AEROBIC AND ANAEROBIC Blood Culture results may not be optimal due to an excessive volume of blood received in culture bottles   Culture   Final    NO GROWTH 3 DAYS Performed at [facility], [address] [place],  [address]    Report Status PENDING  Incomplete     Radiology Studies: No results found.  Scheduled Meds: . sodium chloride   Intravenous Once  . sodium chloride    Intravenous Once  . Chlorhexidine Gluconate Cloth  6 each Topical Daily  . feeding supplement  237 mL Oral TID BM  . lactulose  20 g Oral BID  . midodrine  7.5 mg Oral TID WC  . multivitamin with minerals  1 tablet Oral Daily  . octreotide  100 mcg Subcutaneous TID  . rifaximin  550 mg Oral BID  . sodium bicarbonate  650 mg Oral TID  . sodium chloride flush  10-40 mL Intracatheter Q12H   Continuous Infusions: . albumin human 50 g (04/30/20 1248)  . cefTRIAXone (ROCEPHIN)  IV Stopped (04/30/20 0000)     LOS: 4 days   Time spent: 30 minutes. More than 50% of the time was spent in counseling/coordination of care  Lorella Nimrod, MD [facility]  If 7PM-7AM, please contact night-coverage [email or web address]  04/30/2020, 3:28 PM   This record has been created using Systems analyst. Errors have been sought and corrected,but may not always be located. Such creation errors do not reflect on the standard of care.

## 2020-04-30 NOTE — Progress Notes (Signed)
PT Cancellation Note  Patient Details Name: Shannon Sawyer MRN: [id number] DOB: Sep 10, 1945   Cancelled Treatment:    Reason Eval/Treat Not Completed: Patient declined, no reason specified - pt reports her abdomen is hurting her and refusing PT today despite encouragement from therapist   Isaias Cowman 04/30/2020, 1:27 PM

## 2020-04-30 NOTE — Progress Notes (Signed)
OT Cancellation Note  Patient Details Name: Shannon Sawyer MRN: [id number] DOB: 05-20-1945   Cancelled Treatment:     Attempted to see pt this date, she refused stating she didn't feel well, her stomach was hurting.  Husband at bedside and reports pt hasn't had a good day today.   Will continue attempts.   Amy T Lovett, OTR/L, CLT   Lovett,Amy 04/30/2020, 1:21 PM

## 2020-05-01 ENCOUNTER — Inpatient Hospital Stay: Payer: Medicare Other

## 2020-05-01 DIAGNOSIS — K7290 Hepatic failure, unspecified without coma: Secondary | ICD-10-CM | POA: Diagnosis not present

## 2020-05-01 DIAGNOSIS — K7460 Unspecified cirrhosis of liver: Secondary | ICD-10-CM | POA: Diagnosis not present

## 2020-05-01 DIAGNOSIS — J90 Pleural effusion, not elsewhere classified: Secondary | ICD-10-CM

## 2020-05-01 LAB — COMPREHENSIVE METABOLIC PANEL
ALT: 24 U/L (ref 0–44)
AST: 60 U/L — ABNORMAL HIGH (ref 15–41)
Albumin: 4.7 g/dL (ref 3.5–5.0)
Alkaline Phosphatase: 51 U/L (ref 38–126)
Anion gap: 18 — ABNORMAL HIGH (ref 5–15)
BUN: 166 mg/dL — ABNORMAL HIGH (ref 8–23)
CO2: 15 mmol/L — ABNORMAL LOW (ref 22–32)
Calcium: 8.5 mg/dL — ABNORMAL LOW (ref 8.9–10.3)
Chloride: 101 mmol/L (ref 98–111)
Creatinine, Ser: 2.31 mg/dL — ABNORMAL HIGH (ref 0.44–1.00)
GFR, Estimated: 22 mL/min — ABNORMAL LOW (ref >60)
Glucose, Bld: 91 mg/dL (ref 70–99)
Potassium: 3.8 mmol/L (ref 3.5–5.1)
Sodium: 134 mmol/L — ABNORMAL LOW (ref 135–145)
Total Bilirubin: 12.6 mg/dL — ABNORMAL HIGH (ref 0.3–1.2)
Total Protein: 7.4 g/dL (ref 6.5–8.1)

## 2020-05-01 MED ORDER — FUROSEMIDE 10 MG/ML IJ SOLN
40.0000 mg | Freq: Once | INTRAMUSCULAR | Status: AC
  Administered 2020-05-01: 40 mg via INTRAVENOUS
  Filled 2020-05-01: qty 4

## 2020-05-01 MED ORDER — ONDANSETRON HCL 4 MG/2ML IJ SOLN
4.0000 mg | Freq: Four times a day (QID) | INTRAMUSCULAR | Status: DC | PRN
  Administered 2020-05-01 – 2020-05-02 (×3): 4 mg via INTRAVENOUS
  Filled 2020-05-01 (×3): qty 2

## 2020-05-01 MED ORDER — MORPHINE SULFATE (PF) 2 MG/ML IV SOLN
1.0000 mg | Freq: Once | INTRAVENOUS | Status: AC
  Administered 2020-05-01: 1 mg via INTRAVENOUS
  Filled 2020-05-01: qty 1

## 2020-05-01 NOTE — Progress Notes (Signed)
Patient has increased work of breathing. Using abdominal muscles while breathing. Expiratory wheeze in upper lobe, with minimal air movement in base. Prn duoneb given. Respiratory STACY and dr. Reesa Chew made aware, both came to floor to assess patient. Iv lasix 34m ordered, morphine 1 mg iv once and prn bi pap ordered. Will continue to monitor closely

## 2020-05-01 NOTE — Progress Notes (Signed)
[place], [place] 05/01/20  Subjective:   LOS: 5 Shannon Sawyer is a 75 y.o. female with PMHX anemia, carotid artery disease, cirrhosis not a liver transplant candidate, depression, GERD, essential hypertension, shortness of breath, myocardial infarction, and tricuspid insufficiency.  She is currently admitted to the hospital with acute kidney injury on top of chronic kidney disease with hyponatremia.    Patient is in more distress than yesterday though offers no complaints    Objective:  Vital signs in last 24 hours:  Temp:  [97.7 F (36.5 C)-98 F (36.7 C)] 98 F (36.7 C) (02/27 1154) Pulse Rate:  [61-88] 84 (02/27 1154) Resp:  [15-22] 16 (02/27 1154) BP: (120-179)/(55-66) 127/66 (02/27 1154) SpO2:  [91 %-96 %] 96 % (02/27 1154) Weight:  [65 kg] 65 kg (02/27 0342)  Weight change: -0.771 kg Filed Weights   04/29/20 0316 04/30/20 0300 05/01/20 0342  Weight: 65.4 kg 65.8 kg 65 kg    Intake/Output:    Intake/Output Summary (Last 24 hours) at 05/01/2020 1156 Last data filed at 04/30/2020 2331 Gross per 24 hour  Intake 271.44 ml  Output 400 ml  Net -128.56 ml    Physical Exam: General:  NAD, resting comfortable  HEENT  within normal limits, moist oral mucosa  Neck:  Neck supple  Lungs:  moderate resp distress Decreased breath sounds bilaterally  Heart::  regular rate and rhythm  Abdomen:  Distended with ascites, soft  Extremities:  2+edema  Neurologic:  Awake and alert oriented x3  Skin:  Skin intact     Basic Metabolic Panel:  Recent Labs  Lab 04/25/20 1401 04/26/20 1601 04/27/20 0415 04/28/20 0545 04/29/20 0520 04/30/20 0951 05/01/20 0409  NA 126*   < > 124*  124* 129* 132* 132* 134*  K 4.0   < > 4.1 3.8 3.9 3.8 3.8  CL 97*   < > 96* 99 102 101 101  CO2 16*   < > 18* 18* 16* 18* 15*  GLUCOSE 130*   < > 97 103* 123* 111* 91  BUN 152*   < > 159* 162* 174* 166* 166*  CREATININE 1.94*   < > 2.07* 2.25* 2.31* 2.39* 2.31*   CALCIUM 9.1   < > 8.2* 8.7* 8.6* 8.2* 8.5*  MG 2.5*  --   --   --   --   --   --   PHOS 4.9*  --   --   --  6.7*  --   --    < > = values in this interval not displayed.     CBC: Recent Labs  Lab 04/27/20 0415 04/28/20 0545 04/29/20 0530 04/29/20 1504 04/29/20 2224 04/30/20 0951  WBC 15.8* 15.3* 12.2* 10.1  --  9.8  NEUTROABS 13.5*  --   --  8.2*  --   --   HGB 7.4* 7.6* 6.8* 8.0* 9.2* 10.0*  HCT 20.8* 20.5* 18.7* 22.9* 26.6* 27.6*  MCV 105.6* 102.5* 103.9* 102.2*  --  96.5  PLT 37* 45* 44* 38*  --  40*     No results found for: HEPBSAG, HEPBSAB, HEPBIGM    Microbiology:  Recent Results (from the past 240 hour(s))  Urine Culture     Status: Abnormal   Collection Time: 04/26/20  4:01 PM   Specimen: Urine, Random  Result Value Ref Range Status   Specimen Description   Final    URINE, RANDOM Performed at [facility], [address] [place], [place] [address]  Special Requests   Final    NONE Performed at [facility], [address] [place], [place] [address]    Culture MULTIPLE SPECIES PRESENT, SUGGEST RECOLLECTION (A)  Final   Report Status 04/29/2020 FINAL  Final  Resp Panel by RT-PCR (Flu A&B, Covid) Nasopharyngeal Swab     Status: None   Collection Time: 04/26/20  8:54 PM   Specimen: Nasopharyngeal Swab; Nasopharyngeal(NP) swabs in vial transport medium  Result Value Ref Range Status   SARS Coronavirus 2 by RT PCR NEGATIVE NEGATIVE Final    Comment: (NOTE) SARS-CoV-2 target nucleic acids are NOT DETECTED.  The SARS-CoV-2 RNA is generally detectable in upper respiratory specimens during the acute phase of infection. The lowest concentration of SARS-CoV-2 viral copies this assay can detect is 138 copies/mL. A negative result does not preclude SARS-Cov-2 infection and should not be used as the sole basis for treatment or other patient management decisions. A negative result may occur with  improper specimen collection/handling,  submission of specimen other than nasopharyngeal swab, presence of viral mutation(s) within the areas targeted by this assay, and inadequate number of viral copies(<138 copies/mL). A negative result must be combined with clinical observations, patient history, and epidemiological information. The expected result is Negative.  Fact Sheet for Patients:  EntrepreneurPulse.com.au  Fact Sheet for Healthcare Providers:  IncredibleEmployment.be  This test is no t yet approved or cleared by the [place] FDA and  has been authorized for detection and/or diagnosis of SARS-CoV-2 by FDA under an Emergency Use Authorization (EUA). This EUA will remain  in effect (meaning this test can be used) for the duration of the COVID-19 declaration under Section 564(b)(1) of the Act, 21 U.S.C.section 360bbb-3(b)(1), unless the authorization is terminated  or revoked sooner.       Influenza A by PCR NEGATIVE NEGATIVE Final   Influenza B by PCR NEGATIVE NEGATIVE Final    Comment: (NOTE) The Xpert Xpress SARS-CoV-2/FLU/RSV plus assay is intended as an aid in the diagnosis of influenza from Nasopharyngeal swab specimens and should not be used as a sole basis for treatment. Nasal washings and aspirates are unacceptable for Xpert Xpress SARS-CoV-2/FLU/RSV testing.  Fact Sheet for Patients: EntrepreneurPulse.com.au  Fact Sheet for Healthcare Providers: IncredibleEmployment.be  This test is not yet approved or cleared by the [place] FDA and has been authorized for detection and/or diagnosis of SARS-CoV-2 by FDA under an Emergency Use Authorization (EUA). This EUA will remain in effect (meaning this test can be used) for the duration of the COVID-19 declaration under Section 564(b)(1) of the Act, 21 U.S.C. section 360bbb-3(b)(1), unless the authorization is terminated or revoked.  Performed at [facility], [address] [place], [place] [address]   CULTURE, BLOOD (ROUTINE X 2) w Reflex to ID Panel     Status: None (Preliminary result)   Collection Time: 04/27/20  1:10 PM   Specimen: BLOOD  Result Value Ref Range Status   Specimen Description BLOOD LEFT ANTECUBITAL  Final   Special Requests   Final    BOTTLES DRAWN AEROBIC AND ANAEROBIC Blood Culture adequate volume   Culture   Final    NO GROWTH 4 DAYS Performed at [facility], [address] [place], [place] [address]    Report Status PENDING  Incomplete  CULTURE, BLOOD (ROUTINE X 2) w Reflex to ID Panel     Status: None (Preliminary result)   Collection Time: 04/27/20  1:11 PM   Specimen: BLOOD  Result Value Ref  Range Status   Specimen Description BLOOD RIGHT ANTECUBITAL  Final   Special Requests   Final    BOTTLES DRAWN AEROBIC AND ANAEROBIC Blood Culture results may not be optimal due to an excessive volume of blood received in culture bottles   Culture   Final    NO GROWTH 4 DAYS Performed at [facility], [address] [place], [place] [address]    Report Status PENDING  Incomplete    Coagulation Studies: Recent Labs    04/30/20 0951  LABPROT 24.7*  INR 2.3*    Urinalysis: No results for input(s): COLORURINE, LABSPEC, PHURINE, GLUCOSEU, HGBUR, BILIRUBINUR, KETONESUR, PROTEINUR, UROBILINOGEN, NITRITE, LEUKOCYTESUR in the last 72 hours.  Invalid input(s): APPERANCEUR    Imaging: DG Chest Port 1 View  Result Date: 05/01/2020 CLINICAL DATA:  Hypoxia EXAM: PORTABLE CHEST 1 VIEW COMPARISON:  April 26, 2020 FINDINGS: The cardiomediastinal silhouette is unchanged and enlarged in contour.RIGHT chest port with tip terminating over the RIGHT atrium. Increased large LEFT pleural effusion. Small RIGHT pleural effusion. No pneumothorax. Increased heterogeneous opacity of the RIGHT upper lung. Central vascular congestion and diffuse interstitial prominence. Visualized abdomen is unremarkable. Multilevel  degenerative changes of the thoracic spine. IMPRESSION: 1. Increased large LEFT pleural effusion. 2. Increased heterogeneous opacity of the RIGHT upper lung is concerning for developing infection 3. Diffuse interstitial prominence likely reflects underlying pulmonary edema. Electronically Signed   By: Valentino Saxon MD   On: 05/01/2020 09:37     Medications:   . albumin human Stopped (04/30/20 1600)   . sodium chloride   Intravenous Once  . sodium chloride   Intravenous Once  . Chlorhexidine Gluconate Cloth  6 each Topical Daily  . feeding supplement  237 mL Oral TID BM  . lactulose  20 g Oral BID  . midodrine  7.5 mg Oral TID WC  . multivitamin with minerals  1 tablet Oral Daily  . octreotide  100 mcg Subcutaneous TID  . rifaximin  550 mg Oral BID  . sodium bicarbonate  650 mg Oral TID  . sodium chloride flush  10-40 mL Intracatheter Q12H   ipratropium-albuterol, ondansetron (ZOFRAN) IV, sodium chloride flush  Assessment/ Plan:  75 y.o. female with PMHX chronic kidney disease stage 4, anemia, carotid artery disease, cirrhosis not a liver transplant candidate, depression, GERD, essential hypertension, shortness of breath, myocardial infarction, and tricuspid insufficiency. was admitted on 04/26/2020 for  Principal Problem:   Decompensated hepatic cirrhosis, non-alcoholic (HCC) Active Problems:   Essential (primary) hypertension   Thrombocytopenia (HCC)   CAD (coronary artery disease)   Acute renal failure superimposed on stage IIIb chronic kidney disease (HCC)   Acute on chronic hyponatremia   Leukocytosis   CAP (community acquired pneumonia)  Hyponatremia [E87.1] Pleural effusion [J90] Elevated bilirubin [R17] Decompensated hepatic cirrhosis ([place]) [K72.90, K74.60]      1)Renal    Acute kidney injury Patient has AKI on CKD Patient AKI is multifactorial Patient AKI could be from ATN versus hepatorenal syndrome -Patient AKI is worsening slowly Patient has CKD stage  IV from his baseline -Patient family understands poor prognosis and does not wish for aggressive treatment such as dialysis   2) hypotension Patient is on midodrine    Blood pressure is stable    3)Anemia of chronic disease  CBC Latest Ref Rng & Units 04/30/2020 04/29/2020 04/29/2020  WBC 4.0 - 10.5 K/uL 9.8 - 10.1  Hemoglobin 12.0 - 15.0 g/dL 10.0(L) 9.2(L) 8.0(L)  Hematocrit 36.0 - 46.0 % 27.6(L) 26.6(L)  22.9(L)  Platelets 150 - 400 K/uL 40(L) - 38(L)       HGb at goal (9--11)   4) Secondary hyperparathyroidism -CKD [place]-Bone Disorder    Lab Results  Component Value Date   CALCIUM 8.5 (L) 05/01/2020   PHOS 6.7 (H) 04/29/2020    Secondary Hyperparathyroidism present Phosphorus is not at goal.   5) decompensated nonalcoholic hepatic cirrhosis Patient is not a candidate for liver transplant GI and primary team are following    6) Electrolytes   BMP Latest Ref Rng & Units 05/01/2020 04/30/2020 04/29/2020  Glucose 70 - 99 mg/dL 91 111(H) 123(H)  BUN 8 - 23 mg/dL 166(H) 166(H) 174(H)  Creatinine 0.44 - 1.00 mg/dL 2.31(H) 2.39(H) 2.31(H)  BUN/Creat Ratio 12 - 28 - - -  Sodium 135 - 145 mmol/L 134(L) 132(L) 132(L)  Potassium 3.5 - 5.1 mmol/L 3.8 3.8 3.9  Chloride 98 - 111 mmol/L 101 101 102  CO2 22 - 32 mmol/L 15(L) 18(L) 16(L)  Calcium 8.9 - 10.3 mg/dL 8.5(L) 8.2(L) 8.6(L)     Sodium Hyponatremia Now better   Potassium Normokalemic    7)Acid base  Co2 is not at goal Patient is on p.o. sodium bicarb  8) social Patient has poor prognosis I have had extensive discussion with the patient and her family/husband Patient and her family is leaning towards hospice care   9) pleural effusion Patient chest x-ray done today shows patient has increased large left pleural effusion  Chest x-ray done on May 01, 2020 FINDINGS: The cardiomediastinal silhouette is unchanged and enlarged in contour.RIGHT chest port with tip terminating over the RIGHT  atrium. Increased large LEFT pleural effusion. Small RIGHT pleural effusion. No pneumothorax. Increased heterogeneous opacity of the RIGHT upper lung. Central vascular congestion and diffuse interstitial prominence. Visualized abdomen is unremarkable. Multilevel degenerative changes of the thoracic spine.  IMPRESSION: 1. Increased large LEFT pleural effusion. 2. Increased heterogeneous opacity of the RIGHT upper lung is concerning for developing infection 3. Diffuse interstitial prominence likely reflects underlying pulmonary edema.   Primary team is following for the need of thoracentesis Agree with IV diuresis  Plan  Agree with IV diuresis Primary team is following for the need of thoracentesis Patient with poor prognosis     LOS: 5 Jamarquis Crull s [facility] 2/27/202211:56 AM  [place], [place]

## 2020-05-01 NOTE — Progress Notes (Signed)
Shannon Darby, MD [address]  [place]  [place], [place] [address]  Main: [phone]  Fax: [phone] Pager: [phone]   Subjective: Patient developed respiratory distress in the a.m., chest x-ray revealed large left pleural effusion and heterogeneous opacity of the right upper lung concerning for developing infection as well as pulmonary edema.  She was briefly on BiPAP and received IV Lasix.  When I saw the patient, patient's husband, her brother, sister-in-law and her son were at bedside.  She was off BiPAP and she was lying comfortably upright  Objective: Vital signs in last 24 hours: Vitals:   05/01/20 0342 05/01/20 0827 05/01/20 1154 05/01/20 1607  BP: (!) 157/62 (!) 179/59 127/66 138/67  Pulse: 86 88 84 80  Resp: (!) 22 19 16 16   Temp:  98 F (62.8 C) 98 F (36.7 C) 98 F (36.7 C)  TempSrc: Oral     SpO2: 91% 91% 96% 98%  Weight: 65 kg     Height:       Weight change: -0.771 kg  Intake/Output Summary (Last 24 hours) at 05/01/2020 1800 Last data filed at 05/01/2020 1330 Gross per 24 hour  Intake 271.44 ml  Output 400 ml  Net -128.56 ml     Exam: Heart:: Regular rate and rhythm or S1S2 present Lungs: Increased work of breathing, decreased breath sounds in left lung, crackles in right lung Abdomen: Soft, nontender, mildly distended   Lab Results:  Current Facility-Administered Medications:  .  0.9 %  sodium chloride infusion (Manually program via Guardrails IV Fluids), , Intravenous, Once, Amin, Sumayya, MD .  0.9 %  sodium chloride infusion (Manually program via Guardrails IV Fluids), , Intravenous, Once, Amin, Soundra Pilon, MD .  albumin human 25 % solution 50 g, 50 g, Intravenous, Q1200, Vanga, Tally Due, MD, Last Rate: 60 mL/hr at 05/01/20 1449, 50 g at 05/01/20 1449 .  Chlorhexidine Gluconate Cloth 2 % PADS 6 each, 6 each, Topical, Daily, Athena Masse, MD, 6 each at 04/30/20 1028 .  feeding supplement (ENSURE ENLIVE / ENSURE PLUS)  liquid 237 mL, 237 mL, Oral, TID BM, Lorella Nimrod, MD, 237 mL at 05/01/20 1622 .  ipratropium-albuterol (DUONEB) 0.5-2.5 (3) MG/3ML nebulizer solution 3 mL, 3 mL, Nebulization, Q4H PRN, Sharion Settler, NP, 3 mL at 05/01/20 0834 .  lactulose (CHRONULAC) 10 GM/15ML solution 20 g, 20 g, Oral, BID, Vanga, Tally Due, MD, 20 g at 04/28/20 1052 .  midodrine (PROAMATINE) tablet 7.5 mg, 7.5 mg, Oral, TID WC, Lorella Nimrod, MD, 7.5 mg at 05/01/20 1638 .  multivitamin with minerals tablet 1 tablet, 1 tablet, Oral, Daily, Lorella Nimrod, MD, 1 tablet at 05/01/20 0845 .  octreotide (SANDOSTATIN) injection 100 mcg, 100 mcg, Subcutaneous, TID, Vanga, Tally Due, MD, 100 mcg at 05/01/20 1638 .  ondansetron (ZOFRAN) injection 4 mg, 4 mg, Intravenous, Q6H PRN, Lorella Nimrod, MD, 4 mg at 05/01/20 1046 .  rifaximin (XIFAXAN) tablet 550 mg, 550 mg, Oral, BID, Vanga, Tally Due, MD, 550 mg at 05/01/20 0844 .  sodium bicarbonate tablet 650 mg, 650 mg, Oral, TID, Lorella Nimrod, MD, 650 mg at 05/01/20 1638 .  sodium chloride flush (NS) 0.9 % injection 10-40 mL, 10-40 mL, Intracatheter, Q12H, Athena Masse, MD, 10 mL at 05/01/20 0857 .  sodium chloride flush (NS) 0.9 % injection 10-40 mL, 10-40 mL, Intracatheter, PRN, Athena Masse, MD, 10 mL at 04/29/20 0920  Micro Results: Recent Results (from the past 240 hour(s))  Urine Culture  Status: Abnormal   Collection Time: 04/26/20  4:01 PM   Specimen: Urine, Random  Result Value Ref Range Status   Specimen Description   Final    URINE, RANDOM Performed at [facility], [address] [place], [place] [address]    Special Requests   Final    NONE Performed at [facility], [address] [place], [place] [address]    Culture MULTIPLE SPECIES PRESENT, SUGGEST RECOLLECTION (A)  Final   Report Status 04/29/2020 FINAL  Final  Resp Panel by RT-PCR (Flu A&B, Covid) Nasopharyngeal Swab     Status: None   Collection Time: 04/26/20  8:54  PM   Specimen: Nasopharyngeal Swab; Nasopharyngeal(NP) swabs in vial transport medium  Result Value Ref Range Status   SARS Coronavirus 2 by RT PCR NEGATIVE NEGATIVE Final    Comment: (NOTE) SARS-CoV-2 target nucleic acids are NOT DETECTED.  The SARS-CoV-2 RNA is generally detectable in upper respiratory specimens during the acute phase of infection. The lowest concentration of SARS-CoV-2 viral copies this assay can detect is 138 copies/mL. A negative result does not preclude SARS-Cov-2 infection and should not be used as the sole basis for treatment or other patient management decisions. A negative result may occur with  improper specimen collection/handling, submission of specimen other than nasopharyngeal swab, presence of viral mutation(s) within the areas targeted by this assay, and inadequate number of viral copies(<138 copies/mL). A negative result must be combined with clinical observations, patient history, and epidemiological information. The expected result is Negative.  Fact Sheet for Patients:  EntrepreneurPulse.com.au  Fact Sheet for Healthcare Providers:  IncredibleEmployment.be  This test is no t yet approved or cleared by the [place] FDA and  has been authorized for detection and/or diagnosis of SARS-CoV-2 by FDA under an Emergency Use Authorization (EUA). This EUA will remain  in effect (meaning this test can be used) for the duration of the COVID-19 declaration under Section 564(b)(1) of the Act, 21 U.S.C.section 360bbb-3(b)(1), unless the authorization is terminated  or revoked sooner.       Influenza A by PCR NEGATIVE NEGATIVE Final   Influenza B by PCR NEGATIVE NEGATIVE Final    Comment: (NOTE) The Xpert Xpress SARS-CoV-2/FLU/RSV plus assay is intended as an aid in the diagnosis of influenza from Nasopharyngeal swab specimens and should not be used as a sole basis for treatment. Nasal washings and aspirates are  unacceptable for Xpert Xpress SARS-CoV-2/FLU/RSV testing.  Fact Sheet for Patients: EntrepreneurPulse.com.au  Fact Sheet for Healthcare Providers: IncredibleEmployment.be  This test is not yet approved or cleared by the [place] FDA and has been authorized for detection and/or diagnosis of SARS-CoV-2 by FDA under an Emergency Use Authorization (EUA). This EUA will remain in effect (meaning this test can be used) for the duration of the COVID-19 declaration under Section 564(b)(1) of the Act, 21 U.S.C. section 360bbb-3(b)(1), unless the authorization is terminated or revoked.  Performed at [facility], [address] [place], [place] [address]   CULTURE, BLOOD (ROUTINE X 2) w Reflex to ID Panel     Status: None (Preliminary result)   Collection Time: 04/27/20  1:10 PM   Specimen: BLOOD  Result Value Ref Range Status   Specimen Description BLOOD LEFT ANTECUBITAL  Final   Special Requests   Final    BOTTLES DRAWN AEROBIC AND ANAEROBIC Blood Culture adequate volume   Culture   Final    NO GROWTH 4 DAYS Performed at [facility], [address] [place], [place] [address]    Report Status PENDING  Incomplete  CULTURE, BLOOD (ROUTINE X 2) w Reflex to ID Panel     Status: None (Preliminary result)   Collection Time: 04/27/20  1:11 PM   Specimen: BLOOD  Result Value Ref Range Status   Specimen Description BLOOD RIGHT ANTECUBITAL  Final   Special Requests   Final    BOTTLES DRAWN AEROBIC AND ANAEROBIC Blood Culture results may not be optimal due to an excessive volume of blood received in culture bottles   Culture   Final    NO GROWTH 4 DAYS Performed at [facility], [address] [place], [place] [address]    Report Status PENDING  Incomplete   Studies/Results: DG Chest Port 1 View  Result Date: 05/01/2020 CLINICAL DATA:  Hypoxia EXAM: PORTABLE CHEST 1 VIEW COMPARISON:  April 26, 2020 FINDINGS: The  cardiomediastinal silhouette is unchanged and enlarged in contour.RIGHT chest port with tip terminating over the RIGHT atrium. Increased large LEFT pleural effusion. Small RIGHT pleural effusion. No pneumothorax. Increased heterogeneous opacity of the RIGHT upper lung. Central vascular congestion and diffuse interstitial prominence. Visualized abdomen is unremarkable. Multilevel degenerative changes of the thoracic spine. IMPRESSION: 1. Increased large LEFT pleural effusion. 2. Increased heterogeneous opacity of the RIGHT upper lung is concerning for developing infection 3. Diffuse interstitial prominence likely reflects underlying pulmonary edema. Electronically Signed   By: Valentino Saxon MD   On: 05/01/2020 09:37   Medications:  I have reviewed the patient's current medications. Prior to Admission:  Medications Prior to Admission  Medication Sig Dispense Refill Last Dose  . acetaminophen (TYLENOL) 500 MG tablet Take 500 mg by mouth every 6 (six) hours as needed.    prn at prn  . albumin human 25 % bottle Inject 50 grams no matter what 50 mL 0 Past Month at Unknown time  . alendronate (FOSAMAX) 70 MG tablet Take 70 mg by mouth once a week.   Past Week at Unknown time  . aMILoride (MIDAMOR) 5 MG tablet Take 10 mg by mouth in the morning and at bedtime.   Past Week at Unknown time  . aspirin EC 81 MG tablet Take 81 mg by mouth daily.    Past Week at Unknown time  . furosemide (LASIX) 20 MG tablet Take 20 mg by mouth daily.   Past Week at Unknown time  . lactulose (CHRONULAC) 10 GM/15ML solution Take 20 g by mouth daily.    Past Week at Unknown time  . levofloxacin (LEVAQUIN) 250 MG tablet Take 250 mg by mouth daily.   Past Week at Unknown time  . mirtazapine (REMERON) 15 MG tablet Take 1 tablet (15 mg total) by mouth at bedtime. 90 tablet 1 Past Week at Unknown time  . nitroGLYCERIN (NITROSTAT) 0.4 MG SL tablet Place 0.4 mg under the tongue every 5 (five) minutes as needed for chest pain.    prn  at prn  . omeprazole (PRILOSEC) 20 MG capsule Take 20 mg by mouth daily.    Past Week at Unknown time  . ondansetron (ZOFRAN) 4 MG tablet Take 4 mg by mouth daily.   Past Week at unknown  . propranolol (INDERAL) 10 MG tablet Take 10 mg by mouth 2 (two) times daily.   Past Week at Unknown time  . rifaximin (XIFAXAN) 550 MG TABS tablet Take 550 mg by mouth 2 (two) times daily.   Past Week at Unknown time  . sertraline (ZOLOFT) 25 MG  tablet Take 25 mg by mouth daily.   Past Week at Unknown time   Scheduled: . sodium chloride   Intravenous Once  . sodium chloride   Intravenous Once  . Chlorhexidine Gluconate Cloth  6 each Topical Daily  . feeding supplement  237 mL Oral TID BM  . lactulose  20 g Oral BID  . midodrine  7.5 mg Oral TID WC  . multivitamin with minerals  1 tablet Oral Daily  . octreotide  100 mcg Subcutaneous TID  . rifaximin  550 mg Oral BID  . sodium bicarbonate  650 mg Oral TID  . sodium chloride flush  10-40 mL Intracatheter Q12H   Continuous: . albumin human 50 g (05/01/20 1449)   HDQ:[id number], ondansetron (ZOFRAN) IV, sodium chloride flush Anti-infectives (From admission, onward)   Start     Dose/Rate Route Frequency Ordered Stop   04/27/20 2330  cefTRIAXone (ROCEPHIN) 2 g in sodium chloride 0.9 % 100 mL IVPB        2 g 200 mL/hr over 30 Minutes Intravenous Every 24 hours 04/26/20 2333 04/30/20 2314   04/27/20 2300  azithromycin (ZITHROMAX) 500 mg in sodium chloride 0.9 % 250 mL IVPB  Status:  Discontinued        500 mg 250 mL/hr over 60 Minutes Intravenous Every 24 hours 04/26/20 2333 04/27/20 1349   04/27/20 1300  rifaximin (XIFAXAN) tablet 550 mg        550 mg Oral 2 times daily 04/27/20 1204     04/26/20 2230  cefTRIAXone (ROCEPHIN) 2 g in sodium chloride 0.9 % 100 mL IVPB  Status:  Discontinued        2 g 200 mL/hr over 30 Minutes Intravenous Every 24 hours 04/26/20 2222 04/27/20 0020   04/26/20 2230  azithromycin (ZITHROMAX) 500 mg in sodium  chloride 0.9 % 250 mL IVPB  Status:  Discontinued        500 mg 250 mL/hr over 60 Minutes Intravenous Every 24 hours 04/26/20 2222 04/27/20 0019     Scheduled Meds: . sodium chloride   Intravenous Once  . sodium chloride   Intravenous Once  . Chlorhexidine Gluconate Cloth  6 each Topical Daily  . feeding supplement  237 mL Oral TID BM  . lactulose  20 g Oral BID  . midodrine  7.5 mg Oral TID WC  . multivitamin with minerals  1 tablet Oral Daily  . octreotide  100 mcg Subcutaneous TID  . rifaximin  550 mg Oral BID  . sodium bicarbonate  650 mg Oral TID  . sodium chloride flush  10-40 mL Intracatheter Q12H   Continuous Infusions: . albumin human 50 g (05/01/20 1449)   PRN Meds:.ipratropium-albuterol, ondansetron (ZOFRAN) IV, sodium chloride flush   Assessment: Principal Problem:   Decompensated hepatic cirrhosis, non-alcoholic (HCC) Active Problems:   Essential (primary) hypertension   Thrombocytopenia (HCC)   CAD (coronary artery disease)   Acute renal failure superimposed on stage IIIb chronic kidney disease (HCC)   Acute on chronic hyponatremia   Leukocytosis   CAP (community acquired pneumonia)   Plan: Shannon Sawyer is a 75 y.o. female with decompensated NASH cirrhosis with ascites, hepatic encephalopathy, history of coronary disease s/p PCI, history of chronic kidney disease is admitted with acute on chronic decompensated cirrhosis of liver leading to worsening of renal function, worsening of hyponatremia as well as anemia.  Patient's hyponatremia has been improving.  However, her renal function continues to deteriorate despite repletion of intravenous volume, midodrine and  octreotide.  Her bilirubin levels are also worsening.  Today, I had a family meeting along with Dr. Reesa Chew at patient's bedside. I have also communicated with her transplant hematologist, Dr. Rayvon Char at [facility], I have updated her regarding patient's clinical status, meld score.  Dr. Drue Novel confirmed  that patient is not on the liver transplant list and she is not a transplant candidate.  Palliative care is on board and patient's family chose for patient to be transferred to inpatient hospice.  She is currently DNR/DNI.  Her goals of care are to keep her comfortable in hospice.  Patient will undergo thoracentesis tomorrow as a palliative measure to relieve her immediate respiratory distress.  Patient and her husband, patient's son and her brother and  sister-in-law expressed understanding of our plan.   Thank you for involving me in the care of this patient.  GI will sign off at this time, please call us back with questions or concerns   LOS: 5 days   Rohini Vanga 05/01/2020, 6:00 PM

## 2020-05-01 NOTE — Plan of Care (Signed)
  Problem: Education: Goal: Knowledge of General Education information will improve Description: Including pain rating scale, medication(s)/side effects and non-pharmacologic comfort measures Outcome: Progressing   Problem: Health Behavior/Discharge Planning: Goal: Ability to manage health-related needs will improve Outcome: Progressing   Problem: Clinical Measurements: Goal: Ability to maintain clinical measurements within normal limits will improve Outcome: Progressing Goal: Will remain free from infection Outcome: Progressing Goal: Diagnostic test results will improve Outcome: Progressing Goal: Respiratory complications will improve Outcome: Progressing Goal: Cardiovascular complication will be avoided Outcome: Progressing   Problem: Activity: Goal: Risk for activity intolerance will decrease Outcome: Progressing   Problem: Nutrition: Goal: Adequate nutrition will be maintained Outcome: Progressing   Problem: Coping: Goal: Level of anxiety will decrease Outcome: Progressing   Problem: Elimination: Goal: Will not experience complications related to bowel motility Outcome: Progressing Goal: Will not experience complications related to urinary retention Outcome: Progressing   Problem: Pain Managment: Goal: General experience of comfort will improve Outcome: Progressing   Problem: Safety: Goal: Ability to remain free from injury will improve Outcome: Progressing   Problem: Skin Integrity: Goal: Risk for impaired skin integrity will decrease Outcome: Progressing     Pt is AAOx4. VSS. Pt denies any pain. Pt had wheezing this AM. RT came and gave pt PRN neb tx. Pt verbalized relief. Safety measures maintained. Will continue to monitor.

## 2020-05-01 NOTE — Progress Notes (Signed)
PROGRESS NOTE    Shannon Sawyer  ONG:[id number] DOB: April 16, 1945 DOA: 04/26/2020 PCP: Gladstone Lighter, MD   Brief Narrative: Taken from H&P. Shannon Sawyer is a 75 y.o. female with medical history significant for CAD, HTN, depression, CKD IIIb, nonalcoholic liver cirrhosis, not a liver transplant candidate, who was recently referred to palliative care who was sent in by GI for evaluation of altered mental status and concern for labs. Patient is having worsening renal function and her home dose of Levaquin which she was taking for SBP prophylaxis was held. Patient with worsening liver functions, hyperbilirubinemia, hyponatremia, thrombocytopenia, leukocytosis which seems chronic and anemia. Ultrasound with mild ascites.  Procalcitonin elevated at 0.88 Started on ceftriaxone for concern of SBP. Worsening meld-Na score of 35 and class C child Pugh score, mortality of 65-66% in next 3 months.  Ongoing discussion with family, had a long discussion with husband, brother, sister-in-law and son in the room along with Dr. Marius Ditch from GI, patient with very poor prognosis and appropriate for hospice.  Family is leaning towards it and would like to explore inpatient hospice services. We will continue current level of care and will try getting thoracentesis for palliative reasons if possible with her elevated INR and thrombocytopenia.  Subjective: Patient becomes very dyspneic and hypoxic with increased work of breathing earlier today.  Appears in distress but denies any new complaint.  Assessment & Plan:   Principal Problem:   Decompensated hepatic cirrhosis, non-alcoholic (HCC) Active Problems:   Essential (primary) hypertension   Thrombocytopenia (HCC)   CAD (coronary artery disease)   Acute renal failure superimposed on stage IIIb chronic kidney disease (HCC)   Acute on chronic hyponatremia   Leukocytosis   CAP (community acquired pneumonia)  Acute respiratory distress.  Patient developed  worsening respiratory distress with increased work of breathing this morning.  Chest x-ray with large left pleural effusion.  Patient do have an history of thoracentesis before. Gave her 1 dose of IV Lasix 40 mg and 1 mg of morphine with some improvement in respiratory status.  Continue to use accessory muscles. She was also placed on BiPAP for short amount of time for comfort and it was removed as she was becoming nauseated to prevent aspiration. Had a long discussion with family again-patient is appropriate for hospice care and comfort measures only, they want to try thoracentesis to help her breathe at this time.  No escalation of care. -Talked with Dr. Pascal Lux from IR and they will do thoracentesis tomorrow morning. -Can attempt another dose of Lasix if needed and blood pressure allows.  Decompensated nonalcoholic hepatic cirrhosis. With class C liver cirrhosis and worsening meld-Na score at 35 which makes her with very increased risk of mortality, 65-66% in the next 50-month Worsening liver functions and failure. Patient also developed hepatorenal syndrome with worsening renal function. Exam positive for flapping tremors and marked scleral icterus, Patient was on Levaquin for SBP prophylaxis which was discontinued recently due to worsening renal function. She was started on ceftriaxone and Zithromax, chest x-ray with infiltrate/atelectasis, most likely atelectasis as there is no significant upper respiratory symptoms.  Patient does has leukocytosis and elevated procalcitonin.  Leukocytosis seems chronic. Not a candidate for liver transplant. -GI started her on albumin and octreotide. -Continue midodrine -Continue with lactulose and rifaximin. -Continue with ceftriaxone-to complete a 5-day course. -Continue with supportive care. -Palliative care consult-patient is very high risk for deterioration and death, ongoing discussion, patient was made DNR. -Another long discussion with multiple family  members  today along with Dr. Marius Ditch from GI, husband is interested in being evaluated by in patient hospice care which can be done tomorrow.  We will continue current level of care and will attempt thoracentesis with IR for palliative reasons.  Leukocytosis.  Seems chronic per chart review.  Procalcitonin elevated at 0.88 but that is unreliable with CKD. Chest x-ray with some concern of infiltrate/atelectasis but no upper respiratory symptoms, less likely pneumonia.  UA with some leukocytes. She was started on Zithromax and ceftriaxone for a possible CAPl, Zithromax was discontinued.  Blood and urine cultures remain negative. -Continue with ceftriaxone as it will cover for any SBP-complete a 5-day course. -Can be an underlying hematologic abnormality secondary to liver disease.  Acute on chronic hyponatremia.  Improved to 134 today.  Most likely secondary to liver disease.  Urinary sodium less than 10, elevated serum osmolality at 324. Baseline sodium around 126-130.  Patient did received some IV fluid. Patient was on Zoloft and Lasix at home which can be contributory. Nephrology was consulted at the request of GI. -Keep holding Zoloft  -Did give her 1 dose of Lasix for respiratory distress. -Monitor sodium  Thrombocytopenia.  Most likely secondary to liver disease. Platelets at 40 .  No obvious bleeding, multiple bruising involving extremities. -Continue to monitor.  Anemia.  Improved to 10 after receiving 2 units of PRBC.  No obvious bleeding and stool occult card was negative.  Anemia panel consistent with anemia of chronic disease, most likely secondary to liver and kidney disease. -Continue to monitor -Transfuse if below 7  AKI with CKD stage IIIb.  Worsening creatinine and BUN.  Most likely secondary to hepatorenal syndrome and poor p.o. intake. Increased somnolence most likely secondary to azotemia. -Nephrology was consulted-we will appreciate their recommendations. -Continue with  midodrine to avoid hypotension, dose increased on 2/24.. -Monitor renal function -Avoid nephrotoxins  Hypertension.  Blood pressure elevated today. -Patient is on midodrine. -Keep holding home antihypertensives  CAD.  No chest pain and EKG without any acute changes. -Continue aspirin. -Holding propranolol due to softer blood pressure, can be resumed if blood pressure improves.   Objective: Vitals:   05/01/20 0335 05/01/20 0342 05/01/20 0827 05/01/20 1154  BP:  (!) 157/62 (!) 179/59 127/66  Pulse:  86 88 84  Resp:  (!) 22 19 16   Temp:   98 F (36.7 C) 98 F (36.7 C)  TempSrc:  Oral    SpO2: 93% 91% 91% 96%  Weight:  65 kg    Height:        Intake/Output Summary (Last 24 hours) at 05/01/2020 1340 Last data filed at 04/30/2020 2331 Gross per 24 hour  Intake 271.44 ml  Output 400 ml  Net -128.56 ml   Filed Weights   04/29/20 0316 04/30/20 0300 05/01/20 0342  Weight: 65.4 kg 65.8 kg 65 kg    Examination:  General.  Chronically ill-appearing, lethargic lady, appears in respiratory distress. Pulmonary.  Few scattered wheeze with decreased breath sound at bases, increased work of breathing, using accessory muscles. CV.  Regular rate and rhythm, no JVD, rub or murmur. Abdomen.  Soft, nontender,BS positive. CNS.  Alert and oriented.  No focal neurologic deficit. Extremities.  No edema, no cyanosis, pulses intact and symmetrical. Psychiatry.  Judgment and insight appears normal.  DVT prophylaxis: SCDs Code Status: DNR Family Communication: Discussed with husband, son and brother at bedside. Disposition Plan:  Status is: Inpatient  Remains inpatient appropriate because:Inpatient level of care appropriate due to severity of  illness   Dispo: The patient is from: Home              Anticipated d/c is to: To be determined              Anticipated d/c date is: 2 days              Patient currently is not medically stable to d/c.   Difficult to place patient No               Level of care: Progressive Cardiac  Patient is very high risk for deterioration and death.  Husband is leaning toward hospice care but no final decision yet.  All the records are reviewed and case discussed with Care Management/Social Worker. Management plans discussed with the patient, nursing and they are in agreement.  Consultants:   GI  Nephrology  Palliative care  Procedures:  Antimicrobials:  Ceftriaxone  Data Reviewed: I have personally reviewed following labs and imaging studies  CBC: Recent Labs  Lab 04/27/20 0415 04/28/20 0545 04/29/20 0530 04/29/20 1504 04/29/20 2224 04/30/20 0951  WBC 15.8* 15.3* 12.2* 10.1  --  9.8  NEUTROABS 13.5*  --   --  8.2*  --   --   HGB 7.4* 7.6* 6.8* 8.0* 9.2* 10.0*  HCT 20.8* 20.5* 18.7* 22.9* 26.6* 27.6*  MCV 105.6* 102.5* 103.9* 102.2*  --  96.5  PLT 37* 45* 44* 38*  --  40*   Basic Metabolic Panel: Recent Labs  Lab 04/25/20 1401 04/26/20 1601 04/27/20 0415 04/28/20 0545 04/29/20 0520 04/30/20 0951 05/01/20 0409  NA 126*   < > 124*  124* 129* 132* 132* 134*  K 4.0   < > 4.1 3.8 3.9 3.8 3.8  CL 97*   < > 96* 99 102 101 101  CO2 16*   < > 18* 18* 16* 18* 15*  GLUCOSE 130*   < > 97 103* 123* 111* 91  BUN 152*   < > 159* 162* 174* 166* 166*  CREATININE 1.94*   < > 2.07* 2.25* 2.31* 2.39* 2.31*  CALCIUM 9.1   < > 8.2* 8.7* 8.6* 8.2* 8.5*  MG 2.5*  --   --   --   --   --   --   PHOS 4.9*  --   --   --  6.7*  --   --    < > = values in this interval not displayed.   GFR: Estimated Creatinine Clearance: 18.5 mL/min (A) (by C-G formula based on SCr of 2.31 mg/dL (H)). Liver Function Tests: Recent Labs  Lab 04/26/20 1601 04/27/20 0415 04/28/20 0545 04/29/20 0520 04/30/20 0951 05/01/20 0409  AST 106* 92* 88*  --  62* 60*  ALT 39 36 33  --  26 24  ALKPHOS 116 96 77  --  50 51  BILITOT 8.0* 7.6* 7.2*  --  9.5* 12.6*  PROT 7.2 6.7 7.2  --  6.6 7.4  ALBUMIN 3.6 3.3* 4.0 4.0 4.0 4.7   No results for input(s):  LIPASE, AMYLASE in the last 168 hours. Recent Labs  Lab 04/26/20 2054  AMMONIA 44*   Coagulation Profile: Recent Labs  Lab 04/27/20 0909 04/30/20 0951  INR 2.1* 2.3*   Cardiac Enzymes: No results for input(s): CKTOTAL, CKMB, CKMBINDEX, TROPONINI in the last 168 hours. BNP (last 3 results) No results for input(s): PROBNP in the last 8760 hours. HbA1C: No results for input(s): HGBA1C in the last 72 hours. CBG:  Recent Labs  Lab 04/29/20 0829  GLUCAP 119*   Lipid Profile: No results for input(s): CHOL, HDL, LDLCALC, TRIG, CHOLHDL, LDLDIRECT in the last 72 hours. Thyroid Function Tests: No results for input(s): TSH, T4TOTAL, FREET4, T3FREE, THYROIDAB in the last 72 hours. Anemia Panel: No results for input(s): VITAMINB12, FOLATE, FERRITIN, TIBC, IRON, RETICCTPCT in the last 72 hours. Sepsis Labs: Recent Labs  Lab 04/26/20 2054 04/27/20 0415  PROCALCITON  --  0.88  LATICACIDVEN 1.7  --     Recent Results (from the past 240 hour(s))  Urine Culture     Status: Abnormal   Collection Time: 04/26/20  4:01 PM   Specimen: Urine, Random  Result Value Ref Range Status   Specimen Description   Final    URINE, RANDOM Performed at [facility], [address], [place], [place] [address]    Special Requests   Final    NONE Performed at [facility], [address] [place], [place] [address]    Culture MULTIPLE SPECIES PRESENT, SUGGEST RECOLLECTION (A)  Final   Report Status 04/29/2020 FINAL  Final  Resp Panel by RT-PCR (Flu A&B, Covid) Nasopharyngeal Swab     Status: None   Collection Time: 04/26/20  8:54 PM   Specimen: Nasopharyngeal Swab; Nasopharyngeal(NP) swabs in vial transport medium  Result Value Ref Range Status   SARS Coronavirus 2 by RT PCR NEGATIVE NEGATIVE Final    Comment: (NOTE) SARS-CoV-2 target nucleic acids are NOT DETECTED.  The SARS-CoV-2 RNA is generally detectable in upper respiratory specimens during the acute phase of infection.  The lowest concentration of SARS-CoV-2 viral copies this assay can detect is 138 copies/mL. A negative result does not preclude SARS-Cov-2 infection and should not be used as the sole basis for treatment or other patient management decisions. A negative result may occur with  improper specimen collection/handling, submission of specimen other than nasopharyngeal swab, presence of viral mutation(s) within the areas targeted by this assay, and inadequate number of viral copies(<138 copies/mL). A negative result must be combined with clinical observations, patient history, and epidemiological information. The expected result is Negative.  Fact Sheet for Patients:  EntrepreneurPulse.com.au  Fact Sheet for Healthcare Providers:  IncredibleEmployment.be  This test is no t yet approved or cleared by the [place] FDA and  has been authorized for detection and/or diagnosis of SARS-CoV-2 by FDA under an Emergency Use Authorization (EUA). This EUA will remain  in effect (meaning this test can be used) for the duration of the COVID-19 declaration under Section 564(b)(1) of the Act, 21 U.S.C.section 360bbb-3(b)(1), unless the authorization is terminated  or revoked sooner.       Influenza A by PCR NEGATIVE NEGATIVE Final   Influenza B by PCR NEGATIVE NEGATIVE Final    Comment: (NOTE) The Xpert Xpress SARS-CoV-2/FLU/RSV plus assay is intended as an aid in the diagnosis of influenza from Nasopharyngeal swab specimens and should not be used as a sole basis for treatment. Nasal washings and aspirates are unacceptable for Xpert Xpress SARS-CoV-2/FLU/RSV testing.  Fact Sheet for Patients: EntrepreneurPulse.com.au  Fact Sheet for Healthcare Providers: IncredibleEmployment.be  This test is not yet approved or cleared by the [place] FDA and has been authorized for detection and/or diagnosis of SARS-CoV-2 by FDA  under an Emergency Use Authorization (EUA). This EUA will remain in effect (meaning this test can be used) for the duration of the COVID-19 declaration under Section 564(b)(1) of the Act, 21 U.S.C. section 360bbb-3(b)(1), unless the authorization is terminated  or revoked.  Performed at [facility], [address] [place], [place] [address]   CULTURE, BLOOD (ROUTINE X 2) w Reflex to ID Panel     Status: None (Preliminary result)   Collection Time: 04/27/20  1:10 PM   Specimen: BLOOD  Result Value Ref Range Status   Specimen Description BLOOD LEFT ANTECUBITAL  Final   Special Requests   Final    BOTTLES DRAWN AEROBIC AND ANAEROBIC Blood Culture adequate volume   Culture   Final    NO GROWTH 4 DAYS Performed at [facility], [address] [place], [place] [address]    Report Status PENDING  Incomplete  CULTURE, BLOOD (ROUTINE X 2) w Reflex to ID Panel     Status: None (Preliminary result)   Collection Time: 04/27/20  1:11 PM   Specimen: BLOOD  Result Value Ref Range Status   Specimen Description BLOOD RIGHT ANTECUBITAL  Final   Special Requests   Final    BOTTLES DRAWN AEROBIC AND ANAEROBIC Blood Culture results may not be optimal due to an excessive volume of blood received in culture bottles   Culture   Final    NO GROWTH 4 DAYS Performed at [facility], [address], [place], [place] [address]    Report Status PENDING  Incomplete     Radiology Studies: DG Chest Port 1 View  Result Date: 05/01/2020 CLINICAL DATA:  Hypoxia EXAM: PORTABLE CHEST 1 VIEW COMPARISON:  April 26, 2020 FINDINGS: The cardiomediastinal silhouette is unchanged and enlarged in contour.RIGHT chest port with tip terminating over the RIGHT atrium. Increased large LEFT pleural effusion. Small RIGHT pleural effusion. No pneumothorax. Increased heterogeneous opacity of the RIGHT upper lung. Central vascular congestion and diffuse interstitial prominence. Visualized abdomen is  unremarkable. Multilevel degenerative changes of the thoracic spine. IMPRESSION: 1. Increased large LEFT pleural effusion. 2. Increased heterogeneous opacity of the RIGHT upper lung is concerning for developing infection 3. Diffuse interstitial prominence likely reflects underlying pulmonary edema. Electronically Signed   By: Valentino Saxon MD   On: 05/01/2020 09:37    Scheduled Meds: . sodium chloride   Intravenous Once  . sodium chloride   Intravenous Once  . Chlorhexidine Gluconate Cloth  6 each Topical Daily  . feeding supplement  237 mL Oral TID BM  . lactulose  20 g Oral BID  . midodrine  7.5 mg Oral TID WC  . multivitamin with minerals  1 tablet Oral Daily  . octreotide  100 mcg Subcutaneous TID  . rifaximin  550 mg Oral BID  . sodium bicarbonate  650 mg Oral TID  . sodium chloride flush  10-40 mL Intracatheter Q12H   Continuous Infusions: . albumin human Stopped (04/30/20 1600)     LOS: 5 days   Time spent: 50 minutes. More than 50% of the time was spent in counseling/coordination of care  Lorella Nimrod, MD [facility]  If 7PM-7AM, please contact night-coverage [email or web address]  05/01/2020, 1:40 PM   This record has been created using Systems analyst. Errors have been sought and corrected,but may not always be located. Such creation errors do not reflect on the standard of care.

## 2020-05-02 ENCOUNTER — Inpatient Hospital Stay: Payer: Medicare Other

## 2020-05-02 DIAGNOSIS — K7290 Hepatic failure, unspecified without coma: Secondary | ICD-10-CM | POA: Diagnosis not present

## 2020-05-02 DIAGNOSIS — E871 Hypo-osmolality and hyponatremia: Secondary | ICD-10-CM | POA: Diagnosis not present

## 2020-05-02 DIAGNOSIS — K7460 Unspecified cirrhosis of liver: Secondary | ICD-10-CM | POA: Diagnosis not present

## 2020-05-02 DIAGNOSIS — J90 Pleural effusion, not elsewhere classified: Secondary | ICD-10-CM | POA: Diagnosis not present

## 2020-05-02 DIAGNOSIS — D696 Thrombocytopenia, unspecified: Secondary | ICD-10-CM

## 2020-05-02 DIAGNOSIS — Z515 Encounter for palliative care: Secondary | ICD-10-CM

## 2020-05-02 DIAGNOSIS — R17 Unspecified jaundice: Secondary | ICD-10-CM | POA: Diagnosis not present

## 2020-05-02 DIAGNOSIS — D72829 Elevated white blood cell count, unspecified: Secondary | ICD-10-CM

## 2020-05-02 DIAGNOSIS — Z7189 Other specified counseling: Secondary | ICD-10-CM

## 2020-05-02 DIAGNOSIS — R0600 Dyspnea, unspecified: Secondary | ICD-10-CM

## 2020-05-02 LAB — BODY FLUID CELL COUNT WITH DIFFERENTIAL
Eos, Fluid: 0 %
Lymphs, Fluid: 2 %
Monocyte-Macrophage-Serous Fluid: 27 %
Neutrophil Count, Fluid: 71 %
Other Cells, Fluid: 0 %
Total Nucleated Cell Count, Fluid: 920 cu mm

## 2020-05-02 LAB — COMPREHENSIVE METABOLIC PANEL
ALT: 25 U/L (ref 0–44)
AST: 64 U/L — ABNORMAL HIGH (ref 15–41)
Albumin: 5.4 g/dL — ABNORMAL HIGH (ref 3.5–5.0)
Alkaline Phosphatase: 48 U/L (ref 38–126)
Anion gap: 22 — ABNORMAL HIGH (ref 5–15)
BUN: 176 mg/dL — ABNORMAL HIGH (ref 8–23)
CO2: 14 mmol/L — ABNORMAL LOW (ref 22–32)
Calcium: 8.9 mg/dL (ref 8.9–10.3)
Chloride: 104 mmol/L (ref 98–111)
Creatinine, Ser: 2.42 mg/dL — ABNORMAL HIGH (ref 0.44–1.00)
GFR, Estimated: 20 mL/min — ABNORMAL LOW (ref >60)
Glucose, Bld: 101 mg/dL — ABNORMAL HIGH (ref 70–99)
Potassium: 4.3 mmol/L (ref 3.5–5.1)
Sodium: 140 mmol/L (ref 135–145)
Total Bilirubin: 13.7 mg/dL — ABNORMAL HIGH (ref 0.3–1.2)
Total Protein: 7.8 g/dL (ref 6.5–8.1)

## 2020-05-02 LAB — PROTIME-INR
INR: 2.9 — ABNORMAL HIGH (ref 0.8–1.2)
Prothrombin Time: 29.3 seconds — ABNORMAL HIGH (ref 11.4–15.2)

## 2020-05-02 LAB — PROTEIN, PLEURAL OR PERITONEAL FLUID: Total protein, fluid: <3 g/dL

## 2020-05-02 LAB — CBC
HCT: 27.5 % — ABNORMAL LOW (ref 36.0–46.0)
Hemoglobin: 9.6 g/dL — ABNORMAL LOW (ref 12.0–15.0)
MCH: 35.3 pg — ABNORMAL HIGH (ref 26.0–34.0)
MCHC: 34.9 g/dL (ref 30.0–36.0)
MCV: 101.1 fL — ABNORMAL HIGH (ref 80.0–100.0)
Platelets: 44 10*3/uL — ABNORMAL LOW (ref 150–400)
RBC: 2.72 MIL/uL — ABNORMAL LOW (ref 3.87–5.11)
RDW: 19.6 % — ABNORMAL HIGH (ref 11.5–15.5)
WBC: 13.6 10*3/uL — ABNORMAL HIGH (ref 4.0–10.5)
nRBC: 0.1 % (ref 0.0–0.2)

## 2020-05-02 LAB — CULTURE, BLOOD (ROUTINE X 2)
Culture: NO GROWTH
Culture: NO GROWTH
Special Requests: ADEQUATE

## 2020-05-02 LAB — ALBUMIN, PLEURAL OR PERITONEAL FLUID: Albumin, Fluid: 1.3 g/dL

## 2020-05-02 MED ORDER — SODIUM BICARBONATE 8.4 % IV SOLN
INTRAVENOUS | Status: DC
  Filled 2020-05-02: qty 150
  Filled 2020-05-02 (×2): qty 850

## 2020-05-02 MED ORDER — ENSURE ENLIVE PO LIQD: 237.0000 mL | Freq: Three times a day (TID) | ORAL | 12 refills | Status: AC

## 2020-05-02 MED ORDER — SODIUM BICARBONATE 650 MG PO TABS: 650.0000 mg | ORAL_TABLET | Freq: Three times a day (TID) | ORAL | Status: AC

## 2020-05-02 MED ORDER — MORPHINE SULFATE (PF) 2 MG/ML IV SOLN: 2.0000 mg | INTRAVENOUS | Status: DC | PRN

## 2020-05-02 MED ORDER — FUROSEMIDE 10 MG/ML IJ SOLN
40.0000 mg | Freq: Once | INTRAMUSCULAR | Status: AC
  Administered 2020-05-02: 40 mg via INTRAVENOUS
  Filled 2020-05-02: qty 4

## 2020-05-02 MED ORDER — OXYCODONE HCL 20 MG/ML PO CONC: 5.0000 mg | ORAL | Status: DC | PRN

## 2020-05-02 MED ORDER — LORAZEPAM 2 MG/ML IJ SOLN: 0.5000 mg | Freq: Four times a day (QID) | INTRAMUSCULAR | Status: DC | PRN

## 2020-05-02 NOTE — Progress Notes (Signed)
Albumin unavailable at this time. Pharmacy contacted via phone call, medication coming from [facility].

## 2020-05-02 NOTE — Care Management Important Message (Signed)
Important Message  Patient Details  Name: Shannon Sawyer MRN: [id number] Date of Birth: Jul 12, 1945   Medicare Important Message Given:  Yes     Dannette Barbara 05/02/2020, 11:29 AM

## 2020-05-02 NOTE — Progress Notes (Addendum)
Patient transported via [facility] to hospice house. Husband at bedside. Patient port left accessed per request of facility. Patient has all of belongings. Telemetry d/c and CCMD called.

## 2020-05-02 NOTE — TOC Transition Note (Signed)
Transition of Care [facility]) - CM/SW Discharge Note   Patient Details  Name: Shannon Sawyer MRN: [id number] Date of Birth: [name], 1947  Transition [facility]) CM/SW Contact:  Kerin Salen, RN Phone Number: 05/02/2020, 1:24 PM   Final next level of care: [place] Barriers to Discharge: Barriers Resolved   Patient Goals and CMS Choice Patient states their goals for this hospitalization and ongoing recovery are:: To [place]   Choice offered to / list presented to : [place]  Discharge Placement                Patient to be transferred to facility by: [facility] Name of family member notified: Husband per [facility] Representative Patient and family notified of of transfer: 05/02/20  Discharge Plan and Services In-house Referral: Clinical Social Work,Hospice / Palliative Care Discharge Planning Services: CM Consult Post Acute Care Choice: NA          DME Arranged: Hospice Equipment Package Others DME Agency: Other - Comment ([place]) Date DME Agency Contacted: 05/02/20 Time DME Agency Contacted: 1191 Representative spoke with at DME Agency: [place] Arranged: NA [place] Agency: NA        Social Determinants of Health ([place]) Interventions     Readmission Risk Interventions Readmission Risk Prevention Plan 04/27/2020  Transportation Screening Complete  PCP or Specialist Appt within 3-5 Days Complete  HRI or Home Care Consult Complete  Social Work Consult for [place] Planning/Counseling Complete  Palliative Care Screening Complete  Medication Review Press photographer) Complete  Some recent data might be hidden

## 2020-05-02 NOTE — Progress Notes (Signed)
[place] [facility]) Hospital Liaison RN note:  Received request from Ihor Dow, NP for family interest in [place]. Chart reviewed and eligibility has been approved. Spoke with spouse, Sonia Side to confirm interest and explain services. Sonia Side verbalized understanding and all questions were answered. Hospice Home does have a room to offer today. Sonia Side will sign consents at 2pm at the [facility] and transport can be arranged for [facility] care team is aware. I will fax the D/C Summary.  Please call with any hospice related questions or concerns.  Thank you for the opportunity to participate in this patient's care.  Zandra Abts, RN [facility] Liaison [phone]

## 2020-05-02 NOTE — Progress Notes (Addendum)
Physical Therapy Treatment Patient Details Name: Shannon Sawyer MRN: [id number] DOB: 1945-06-24 Today's Date: 05/02/2020    History of Present Illness Pt is 75 y/o F with who was admitted for hepatic cirrhosis with complaints of AMS. History includes CAD, HTN, depression, and CKD.    PT Comments    Pt with flat affect upon arrival. Reports she is waiting to be transported to thoracentesis. Agreeable to treatment, however limited exertion noted. Needs encouragement to continue there-x. Initially reports she needs to use [facility], however then defers this task. Will continue to progress as able.   * addendum- MD discontinued orders. Will need new PT orders to resume care. Will dc at this time.  Follow Up Recommendations  Home health PT;Supervision/Assistance - 24 hour     Equipment Recommendations  Wheelchair (measurements PT)    Recommendations for Other Services       Precautions / Restrictions Precautions Precautions: Fall Restrictions Weight Bearing Restrictions: No    Mobility  Bed Mobility               General bed mobility comments: initally agreeable to bed mobility with intent to transfer to [facility]. Then changed her mind, stating she wanted to stay in the bed.    Transfers                    Ambulation/Gait                 Stairs             Wheelchair Mobility    Modified Rankin (Stroke Patients Only)       Balance                                            Cognition Arousal/Alertness: Awake/alert Behavior During Therapy: Flat affect Overall Cognitive Status: Within Functional Limits for tasks assessed                                 General Comments: pt stoic this AM.      Exercises Other Exercises Other Exercises: Supine ther-ex performed on B LE and B UE. Increased WOB noted, vitals monitored throughout. Little effort noted this date. AP, SLR, heel slides, hip abd/add, and UE shoulder  flexion. All ther-ex performed x 5-10 reps    General Comments        Pertinent Vitals/Pain Pain Assessment: No/denies pain    Home Living                      Prior Function            PT Goals (current goals can now be found in the care plan section) Acute Rehab PT Goals Patient Stated Goal: none stated this session PT Goal Formulation: With patient Time For Goal Achievement: 05/11/20 Potential to Achieve Goals: Good Progress towards PT goals: Progressing toward goals    Frequency    Min 2X/week      PT Plan Current plan remains appropriate    Co-evaluation              AM-PAC PT "6 Clicks" Mobility   Outcome Measure  Help needed turning from your back to your side while in a flat bed without using bedrails?: A Little Help needed moving from  lying on your back to sitting on the side of a flat bed without using bedrails?: A Little Help needed moving to and from a bed to a chair (including a wheelchair)?: A Little Help needed standing up from a chair using your arms (e.g., wheelchair or bedside chair)?: A Little Help needed to walk in hospital room?: A Lot Help needed climbing 3-5 steps with a railing? : Total 6 Click Score: 15    End of Session Equipment Utilized During Treatment: Oxygen Activity Tolerance: Patient limited by fatigue Patient left: in bed;with bed alarm set Nurse Communication: Mobility status PT Visit Diagnosis: Muscle weakness (generalized) (M62.81);Difficulty in walking, not elsewhere classified (R26.2);Unsteadiness on feet (R26.81)     Time: 7564-3329 PT Time Calculation (min) (ACUTE ONLY): 11 min  Charges:  $Therapeutic Exercise: 8-22 mins                     Shannon Sawyer, PT, DPT [phone]    Shannon Sawyer 05/02/2020, 10:03 AM

## 2020-05-02 NOTE — Progress Notes (Addendum)
Daily Progress Note   Patient Name: Shannon Sawyer       Date: 05/02/2020 DOB: [birth year]  Age: 75 y.o. MRN#: [id number] Attending Physician: Lorella Nimrod, MD Primary Care Physician: Gladstone Lighter, MD Admit Date: 04/26/2020  Reason for Consultation/Follow-up: Establishing goals of care  Subjective: Patient will wake to voice and oriented but slow to respond. Appears uncomfortable with dyspnea at rest and accessory muscle usage.  Dr. Reesa Chew at bedside. Plan is for thoracentesis this AM. Also lasix 32m IV ordered.   GOC:  Dr. AReesa Chewand I followed up with husband and son at bedside. Discussed plan of care including diagnoses, disease trajectory, and poor prognosis. Husband and son understand her condition and confirm plan to focus on her comfort. They are interested in hospice facility transfer when discharged. Discussed hospice philosophy and options, emphasizing focus on comfort and symptom management.  Dr. AReesa Chewis starting bicarb infusion this morning. Explained to family that this can be continued inpatient but she will not continue to receive bicarb infusion when discharged to hospice facility. They confirm understanding. Husband does confirm his desire to have thoracentesis today in hopes this will aid in her comfort and symptom management. Discussed symptom management medications.   Answered questions. Support provided. PMT contact information left on patient white board.   Length of Stay: 6  Current Medications: Scheduled Meds:  . sodium chloride   Intravenous Once  . sodium chloride   Intravenous Once  . Chlorhexidine Gluconate Cloth  6 each Topical Daily  . feeding supplement  237 mL Oral TID BM  . lactulose  20 g Oral BID  . midodrine  7.5 mg Oral TID WC  . octreotide  100  mcg Subcutaneous TID  . rifaximin  550 mg Oral BID  . sodium bicarbonate  650 mg Oral TID  . sodium chloride flush  10-40 mL Intracatheter Q12H    Continuous Infusions: . albumin human 50 g (05/01/20 1449)  . sodium bicarbonate (isotonic) 150 mEq in D5W 1000 mL infusion 75 mL/hr at 05/02/20 1027    PRN Meds: ipratropium-albuterol, LORazepam, morphine injection, ondansetron (ZOFRAN) IV, oxyCODONE, sodium chloride flush  Physical Exam Vitals and nursing note reviewed.  Constitutional:      Appearance: She is ill-appearing.  HENT:     Head: Normocephalic and atraumatic.  Eyes:  General: Scleral icterus present.  Cardiovascular:     Rate and Rhythm: Normal rate.  Pulmonary:     Effort: Accessory muscle usage present.     Comments: Dyspnea at rest. RN to give oxycodone. Abdominal:     General: There is distension.     Tenderness: There is no abdominal tenderness.  Skin:    General: Skin is warm and dry.     Coloration: Skin is jaundiced.  Neurological:     Mental Status: She is easily aroused.     Comments: Awake, uncomfortable. Minimally engaged in conversation.  Psychiatric:        Attention and Perception: She is inattentive.        Speech: Speech is delayed.           Vital Signs: BP (!) 134/49 (BP Location: Left Leg)   Pulse 86   Temp 97.7 F (36.5 C) (Oral)   Resp (!) 21   Ht 5' 1"  (1.549 m)   Wt 65 kg   SpO2 93%   BMI 27.10 kg/m  SpO2: SpO2: 93 % O2 Device: O2 Device: Nasal Cannula O2 Flow Rate: O2 Flow Rate (L/min): 4 L/min  Intake/output summary:   Intake/Output Summary (Last 24 hours) at 05/02/2020 1238 Last data filed at 05/02/2020 1010 Gross per 24 hour  Intake 83.75 ml  Output 600 ml  Net -516.25 ml   LBM: Last BM Date: 05/02/20 Baseline Weight: Weight: 60.8 kg Most recent weight: Weight: 65 kg       Palliative Assessment/Data: PPS 20%    Flowsheet Rows   Flowsheet Row Most Recent Value  Intake Tab   Referral Department Hospitalist   Unit at Time of Referral Cardiac/Telemetry Unit  Palliative Care Primary Diagnosis --  [cirrhosis]  Date Notified 04/27/20  Palliative Care Type New Palliative care  Reason for referral Clarify Goals of Care  Date of Admission 04/26/20  Date first seen by Palliative Care 04/27/20  # of days Palliative referral response time 0 Day(s)  # of days IP prior to Palliative referral 1  Clinical Assessment   Palliative Performance Scale Score 20%  Psychosocial & Spiritual Assessment   Palliative Care Outcomes   Patient/Family meeting held? Yes  Who was at the meeting? Dr. Reesa Chew, patient, husband, son  Palliative Care Outcomes Improved pain interventions, Improved non-pain symptom therapy, Clarified goals of care, Counseled regarding hospice, Provided end of life care assistance, Provided psychosocial or spiritual support, ACP counseling assistance      Patient Active Problem List   Diagnosis Date Noted  . Pleural effusion   . Elevated bilirubin   . Palliative care by specialist   . Goals of care, counseling/discussion   . Acute metabolic encephalopathy 76/80/8811  . CAP (community acquired pneumonia) 04/26/2020  . Vaginal yeast infection 07/29/2019  . Toenail fungus 07/29/2019  . SBP (spontaneous bacterial peritonitis) ([place]) 07/29/2019  . Poor dentition 07/28/2019  . Dysphagia 07/28/2019  . Post-menopausal bleeding 07/28/2019  . Decompensated hepatic cirrhosis, non-alcoholic ([place]) 05/17/9456  . Hypokalemia 06/23/2019  . Stage 3b chronic kidney disease ([place]) 06/23/2019  . Leukocytosis 06/17/2019  . HLD (hyperlipidemia) 05/29/2019  . Anxiety 05/29/2019  . CAD (coronary artery disease) 05/29/2019  . Acute renal failure superimposed on stage IIIb chronic kidney disease ([place]) 05/29/2019  . Generalized weakness 05/29/2019  . Hyponatremia 05/29/2019  . Seasonal allergic rhinitis due to pollen 05/07/2018  . Thrombocytopenia ([place]) 02/03/2018  . H/O acute myocardial infarction 07/11/2017   . GERD (gastroesophageal reflux disease) 07/11/2017  .  Bradycardia 11/06/2016  . Severe anxiety with panic 11/15/2015  . Memory change 06/30/2015  . H/O acute pancreatitis 11/18/2014  . Cervical radiculopathy 06/03/2014  . TI (tricuspid incompetence) 02/12/2014  . Carotid artery narrowing 01/12/2014  . Non-alcoholic cirrhosis ([place]) 37/06/8887  . Anemia, iron deficiency 01/12/2014  . Dyspnea 01/12/2014  . Bilateral carotid artery stenosis 01/12/2014  . Cervical radiculitis 11/04/2013  . DDD (degenerative disc disease), cervical 11/04/2013  . SPL (spondylolisthesis) 11/04/2013  . Arteriosclerosis of coronary artery 05/20/2013  . Essential (primary) hypertension 05/20/2013  . Combined fat and carbohydrate induced hyperlipemia 05/20/2013  . NSTEMI (non-ST elevated myocardial infarction) ([place]) 12/04/2011  . Hyperglycemia 12/04/2011    Palliative Care Assessment & Plan   Patient Profile: 75 y.o. female  with past medical history of CAD, HTN, depression, CKDIIIb,nonalcoholic liver cirrhosis, and not a liver transplant candidate admitted on 04/26/2020 with abnormal labs - worsening renal function. Hyperbilirubinemia, hyponatremia, leukocytosis, and anemia. Patient admitted with decompensated hepatic cirrhosis and acute renal failure. Patient has seen outpatient palliative recently. PMT consulted to discuss GOC  Assessment: Decompensated NASH cirrhosis Large left pleural effusion Acute respiratory distress Hepatorenal syndrome AKI on CKD stage IIIb SBP ? Acute on chronic hyponatremia Leukocytosis Thrombocytopenia Anemia Hypertension CAD  Recommendations/Plan: F/U GOC with patient's family and Dr. Reesa Chew at bedside. Continue current plan of care and medical management inpatient. Shifting towards comfort pathway with discharge to hospice facility. TOC referral placed. Added comfort meds to [facility]. Thoracentesis today. Comfort feeds per patient/family request.   Code  Status: DNR/DNI   Code Status Orders  (From admission, onward)         Start     Ordered   04/27/20 1402  Do not attempt resuscitation (DNR)  Continuous       Question Answer Comment  In the event of cardiac or respiratory ARREST Do not call a "code blue"   In the event of cardiac or respiratory ARREST Do not perform Intubation, CPR, defibrillation or ACLS   In the event of cardiac or respiratory ARREST Use medication by any route, position, wound care, and other measures to relive pain and suffering. May use oxygen, suction and manual treatment of airway obstruction as needed for comfort.      04/27/20 1401        Code Status History    Date Active Date Inactive Code Status Order ID Comments User Context   04/26/2020 2333 04/27/2020 1401 Full Code [id number]  Athena Masse, MD ED   05/29/2019 1833 06/01/2019 2112 Full Code [id number]  Ivor Costa, MD Inpatient   06/03/2014 1233 06/04/2014 1221 Full Code [id number]  Karie Chimera, MD Inpatient   05/07/2014 1432 05/08/2014 0345 Full Code [id number]  Inge Rise, MD [facility]   Advance Care Planning Activity    Advance Directive Documentation   Flowsheet Row Most Recent Value  Type of Advance Directive Living will, Healthcare Power of Attorney  Pre-existing out of facility DNR order (yellow form or pink MOST form) --  "MOST" Form in Place? --      Prognosis: Poor prognosis: less than 2 weeks if not days with decompensated NASH cirrhosis, hepatorenal syndrome, thrombocytopenia/anemia, large left pleural effusion, adult failure to thrive  Discharge Planning: Hospice facility  Care plan was discussed with RN, Dr. Reesa Chew, husband, son, RN CM, hospice liaison  Thank you for allowing the Palliative Medicine Team to assist in the care of this patient.   Total Time 35 Prolonged Time Billed   no  Greater than 50%  of this time was spent counseling and coordinating care related to the above assessment and plan.  Ihor Dow, DNP,  FNP-C Palliative Medicine Team  Phone: [phone] Fax: [phone]  Please contact Palliative Medicine Team phone at [phone] for questions and concerns.

## 2020-05-02 NOTE — Discharge Summary (Signed)
Physician Discharge Summary  Allana B Cadenhead IOE:[id number] DOB: 27-Sep-1945 DOA: 04/26/2020  PCP: Gladstone Lighter, MD  Admit date: 04/26/2020 Discharge date: 05/02/2020  Admitted From: Home Disposition: Hospice house  Recommendations for Outpatient Follow-up:  1. Follow up with PCP in 1-2 weeks 2. Please obtain BMP/CBC in one week 3. Please follow up on the following pending results: Pleural effusion labs.  Home Health: No Equipment/Devices: None Discharge Condition: Guarded CODE STATUS: DNR Diet recommendation: Heart Healthy / Carb Modified / Regular / Dysphagia   Brief/Interim Summary: Lasaundra B Johnsonis a 75 y.o.femalewith medical history significant forCAD, HTN, depression, CKDIIIb,nonalcoholic liver cirrhosis, not a liver transplant candidate, who was recently referred to palliative care who was sent in by GI for evaluation of altered mental status and concern for labs. Patient is having worsening renal function and her home dose of Levaquin which she was taking for SBP prophylaxis was held. Patient with worsening liver functions, hyperbilirubinemia, hyponatremia, thrombocytopenia, leukocytosis which seems chronic and anemia. Ultrasound with mild ascites.  Procalcitonin elevated at 0.88 Started on ceftriaxone for concern of SBP. Worsening meld-Na score of 37 and class C child Pugh score, mortality of 65-66% in next 3 months.  Patient was in decompensated nonalcoholic hepatic cirrhosis with hepatorenal syndrome.  Renal and liver function continued to get worse.  Developed worsening dyspnea and increased work of breathing secondary to large left pleural effusion which was tapped with IR.  More than 1 L of turbid pleural fluid was removed, initial labs appears transudate, rest of them pending. Patient received thoracentesis for palliative reasons to decreased her work of breathing.  Had long discussion with patient, family and gastroenterology as there is nothing much to offer.   Patient is not a candidate for liver transplant.  She completed a course of antibiotics for any presumed SBP.  Renal and hepatic functions continue to get worse.  Patient is becoming more somnolent and lethargic.  Palliative care was involved. After multiple discussions it was decided to proceed with comfort care at a hospice home so patient cannot spend whatever time she had with friends and family.  Patient is being discharged to hospice home.  Very poor prognosis.   Discharge Diagnoses:  Principal Problem:   Decompensated hepatic cirrhosis, non-alcoholic (HCC) Active Problems:   Essential (primary) hypertension   Dyspnea   Thrombocytopenia (HCC)   CAD (coronary artery disease)   Acute renal failure superimposed on stage IIIb chronic kidney disease (HCC)   Hyponatremia   Leukocytosis   CAP (community acquired pneumonia)   Pleural effusion   Elevated bilirubin   Palliative care by specialist   Goals of care, counseling/discussion   Discharge Instructions  Discharge Instructions    Diet - low sodium heart healthy   Complete by: As directed    Increase activity slowly   Complete by: As directed      Allergies as of 05/02/2020      Reactions   Contrast Media  [iodinated Diagnostic Agents] Rash   Blister rash    Azelastine Swelling   Flonase [fluticasone Propionate] Swelling   Prednisone Swelling   Shellfish Allergy Swelling, Other (See Comments)   Per spouse, determined on allergy test.   Tramadol Nausea Only      Medication List    STOP taking these medications   albumin human 25 % bottle   alendronate 70 MG tablet Commonly known as: FOSAMAX   aMILoride 5 MG tablet Commonly known as: MIDAMOR   aspirin EC 81 MG tablet   levofloxacin  250 MG tablet Commonly known as: LEVAQUIN   nitroGLYCERIN 0.4 MG SL tablet Commonly known as: NITROSTAT   propranolol 10 MG tablet Commonly known as: INDERAL   sertraline 25 MG tablet Commonly known as: ZOLOFT      TAKE these medications   acetaminophen 500 MG tablet Commonly known as: TYLENOL Take 500 mg by mouth every 6 (six) hours as needed.   feeding supplement Liqd Take 237 mLs by mouth 3 (three) times daily between meals.   furosemide 20 MG tablet Commonly known as: LASIX Take 20 mg by mouth daily.   lactulose 10 GM/15ML solution Commonly known as: CHRONULAC Take 20 g by mouth daily.   mirtazapine 15 MG tablet Commonly known as: Remeron Take 1 tablet (15 mg total) by mouth at bedtime.   omeprazole 20 MG capsule Commonly known as: PRILOSEC Take 20 mg by mouth daily.   ondansetron 4 MG tablet Commonly known as: ZOFRAN Take 4 mg by mouth daily.   rifaximin 550 MG Tabs tablet Commonly known as: XIFAXAN Take 550 mg by mouth 2 (two) times daily.   sodium bicarbonate 650 MG tablet Take 1 tablet (650 mg total) by mouth 3 (three) times daily.       Allergies  Allergen Reactions  . Contrast Media  [Iodinated Diagnostic Agents] Rash    Blister rash   . Azelastine Swelling  . Flonase [Fluticasone Propionate] Swelling  . Prednisone Swelling  . Shellfish Allergy Swelling and Other (See Comments)    Per spouse, determined on allergy test.  . Tramadol Nausea Only    Consultations:  GI  Palliative care  Nephrology  Procedures/Studies: DG Chest 1 View  Result Date: 05/02/2020 CLINICAL DATA:  Post LEFT thoracentesis EXAM: CHEST  1 VIEW COMPARISON:  Portable exam 1219 hours compared to 05/01/2020 FINDINGS: RIGHT jugular Port-A-Cath with tip projecting over cavoatrial junction. Normal heart size, mediastinal contours, and pulmonary vascularity. Atherosclerotic calcification aorta. Patchy RIGHT lung infiltrates consistent with pneumonia. Small LEFT pleural effusion and basilar atelectasis. Significant decrease in LEFT pleural effusion since previous exam without pneumothorax following thoracentesis. No acute osseous findings. IMPRESSION: No pneumothorax following LEFT  thoracentesis. RIGHT lung infiltrates consistent with pneumonia. Electronically Signed   By: Lavonia Dana M.D.   On: 05/02/2020 13:16   DG Chest Port 1 View  Result Date: 05/01/2020 CLINICAL DATA:  Hypoxia EXAM: PORTABLE CHEST 1 VIEW COMPARISON:  April 26, 2020 FINDINGS: The cardiomediastinal silhouette is unchanged and enlarged in contour.RIGHT chest port with tip terminating over the RIGHT atrium. Increased large LEFT pleural effusion. Small RIGHT pleural effusion. No pneumothorax. Increased heterogeneous opacity of the RIGHT upper lung. Central vascular congestion and diffuse interstitial prominence. Visualized abdomen is unremarkable. Multilevel degenerative changes of the thoracic spine. IMPRESSION: 1. Increased large LEFT pleural effusion. 2. Increased heterogeneous opacity of the RIGHT upper lung is concerning for developing infection 3. Diffuse interstitial prominence likely reflects underlying pulmonary edema. Electronically Signed   By: Valentino Saxon MD   On: 05/01/2020 09:37   DG Chest Portable 1 View  Result Date: 04/26/2020 CLINICAL DATA:  75 year old female with shortness of breath. EXAM: PORTABLE CHEST 1 VIEW COMPARISON:  Chest radiograph dated 05/29/2019. FINDINGS: Right-sided Port-A-Cath with tip at the cavoatrial junction. Left lung base opacity, likely combination of pleural effusion and associated atelectasis or infiltrate. There is diffuse vascular and interstitial prominence consistent with congestion and/or edema. No pneumothorax. Atherosclerotic calcification of the aortic arch. No acute osseous pathology. Lower cervical ACDF. IMPRESSION: Left-sided pleural effusion and associated  atelectasis/infiltrate. Electronically Signed   By: Anner Crete M.D.   On: 04/26/2020 21:24   [place] ASCITES (ABDOMEN LIMITED)  Result Date: 04/23/2020 CLINICAL DATA:  Cirrhosis of liver, nonalcoholic EXAM: LIMITED ABDOMEN ULTRASOUND FOR ASCITES TECHNIQUE: Limited ultrasound survey for ascites  was performed in all four abdominal quadrants. COMPARISON:  05/29/2019 FINDINGS: Mild ascites is identified throughout the abdomen greatest in RIGHT lower quadrant. IMPRESSION: Mild ascites. Electronically Signed   By: Lavonia Dana M.D.   On: 04/23/2020 13:05   US THORACENTESIS ASP PLEURAL SPACE W/IMG GUIDE  Result Date: 05/02/2020 INDICATION: Patient with a history of non-alcoholic liver cirrhosis presents today for a therapeutic diagnostic thoracentesis. EXAM: ULTRASOUND GUIDED THORACENTESIS MEDICATIONS: 1% lidocaine 10 mL COMPLICATIONS: None immediate. PROCEDURE: An ultrasound guided thoracentesis was thoroughly discussed with the patient and questions answered. The benefits, risks, alternatives and complications were also discussed. The patient understands and wishes to proceed with the procedure. Written consent was obtained. Ultrasound was performed to localize and mark an adequate pocket of fluid in the left chest. The area was then prepped and draped in the normal sterile fashion. 1% Lidocaine was used for local anesthesia. Under ultrasound guidance a 6 Fr Safe-T-Centesis catheter was introduced. Thoracentesis was performed. The catheter was removed and a dressing applied. FINDINGS: A total of approximately 1 L of blood-tinged fluid was removed. Samples were sent to the laboratory as requested by the clinical team. IMPRESSION: Successful ultrasound guided left thoracentesis yielding 1 L of pleural fluid. Read by: Soyla Dryer, NP Electronically Signed   By: Ruthann Cancer MD   On: 05/02/2020 13:27     Subjective: Patient was little distress with increased work of breathing, appears more yellow and tired.  Orientation intact.  Husband and brother at bedside.  Discharge Exam: Vitals:   05/02/20 0820 05/02/20 1149  BP: (!) 135/58 (!) 134/49  Pulse: 86   Resp: (!) 21   Temp: 97.7 F (36.5 C)   SpO2: 94% 93%   Vitals:   05/01/20 1956 05/02/20 0357 05/02/20 0820 05/02/20 1149  BP: 134/65  135/67 (!) 135/58 (!) 134/49  Pulse: 77 92 86   Resp: 19 16 (!) 21   Temp: (!) 97.5 F (36.4 C) 97.8 F (36.6 C) 97.7 F (36.5 C)   TempSrc: Oral Axillary Oral   SpO2: 96% 95% 94% 93%  Weight:      Height:        General: Pt is alert, awake, appears distressed and very lethargic with increased work of breathing. Cardiovascular: RRR, S1/S2 +, no rubs, no gallops Respiratory: Decreased breath sound on left mid to lower zone with basal crackles bilaterally. Abdominal: Soft, NT, ND, bowel sounds + Extremities: no edema, no cyanosis   The results of significant diagnostics from this hospitalization (including imaging, microbiology, ancillary and laboratory) are listed below for reference.    Microbiology: Recent Results (from the past 240 hour(s))  Urine Culture     Status: Abnormal   Collection Time: 04/26/20  4:01 PM   Specimen: Urine, Random  Result Value Ref Range Status   Specimen Description   Final    URINE, RANDOM Performed at [facility], [address], [place], [place] [address]    Special Requests   Final    NONE Performed at [facility], [address] [place], [place] [address]    Culture MULTIPLE SPECIES PRESENT, SUGGEST RECOLLECTION (A)  Final   Report Status 04/29/2020 FINAL  Final  Resp Panel by RT-PCR (Flu A&B,  Covid) Nasopharyngeal Swab     Status: None   Collection Time: 04/26/20  8:54 PM   Specimen: Nasopharyngeal Swab; Nasopharyngeal(NP) swabs in vial transport medium  Result Value Ref Range Status   SARS Coronavirus 2 by RT PCR NEGATIVE NEGATIVE Final    Comment: (NOTE) SARS-CoV-2 target nucleic acids are NOT DETECTED.  The SARS-CoV-2 RNA is generally detectable in upper respiratory specimens during the acute phase of infection. The lowest concentration of SARS-CoV-2 viral copies this assay can detect is 138 copies/mL. A negative result does not preclude SARS-Cov-2 infection and should not be used as the sole basis for  treatment or other patient management decisions. A negative result may occur with  improper specimen collection/handling, submission of specimen other than nasopharyngeal swab, presence of viral mutation(s) within the areas targeted by this assay, and inadequate number of viral copies(<138 copies/mL). A negative result must be combined with clinical observations, patient history, and epidemiological information. The expected result is Negative.  Fact Sheet for Patients:  EntrepreneurPulse.com.au  Fact Sheet for Healthcare Providers:  IncredibleEmployment.be  This test is no t yet approved or cleared by the [place] FDA and  has been authorized for detection and/or diagnosis of SARS-CoV-2 by FDA under an Emergency Use Authorization (EUA). This EUA will remain  in effect (meaning this test can be used) for the duration of the COVID-19 declaration under Section 564(b)(1) of the Act, 21 U.S.C.section 360bbb-3(b)(1), unless the authorization is terminated  or revoked sooner.       Influenza A by PCR NEGATIVE NEGATIVE Final   Influenza B by PCR NEGATIVE NEGATIVE Final    Comment: (NOTE) The Xpert Xpress SARS-CoV-2/FLU/RSV plus assay is intended as an aid in the diagnosis of influenza from Nasopharyngeal swab specimens and should not be used as a sole basis for treatment. Nasal washings and aspirates are unacceptable for Xpert Xpress SARS-CoV-2/FLU/RSV testing.  Fact Sheet for Patients: EntrepreneurPulse.com.au  Fact Sheet for Healthcare Providers: IncredibleEmployment.be  This test is not yet approved or cleared by the [place] FDA and has been authorized for detection and/or diagnosis of SARS-CoV-2 by FDA under an Emergency Use Authorization (EUA). This EUA will remain in effect (meaning this test can be used) for the duration of the COVID-19 declaration under Section 564(b)(1) of the Act, 21  U.S.C. section 360bbb-3(b)(1), unless the authorization is terminated or revoked.  Performed at [facility], [address] [place], [place] [address]   CULTURE, BLOOD (ROUTINE X 2) w Reflex to ID Panel     Status: None   Collection Time: 04/27/20  1:10 PM   Specimen: BLOOD  Result Value Ref Range Status   Specimen Description BLOOD LEFT ANTECUBITAL  Final   Special Requests   Final    BOTTLES DRAWN AEROBIC AND ANAEROBIC Blood Culture adequate volume   Culture   Final    NO GROWTH 5 DAYS Performed at [facility], [address] [place], [place] [address]    Report Status 05/02/2020 FINAL  Final  CULTURE, BLOOD (ROUTINE X 2) w Reflex to ID Panel     Status: None   Collection Time: 04/27/20  1:11 PM   Specimen: BLOOD  Result Value Ref Range Status   Specimen Description BLOOD RIGHT ANTECUBITAL  Final   Special Requests   Final    BOTTLES DRAWN AEROBIC AND ANAEROBIC Blood Culture results may not be optimal due to an excessive volume of blood received in culture bottles   Culture   Final  NO GROWTH 5 DAYS Performed at [facility], [address] [place], [place] [address]    Report Status 05/02/2020 FINAL  Final     Labs: BNP (last 3 results) No results for input(s): BNP in the last 8760 hours. Basic Metabolic Panel: Recent Labs  Lab 04/28/20 0545 04/29/20 0520 04/30/20 0951 05/01/20 0409 05/02/20 0545  NA 129* 132* 132* 134* 140  K 3.8 3.9 3.8 3.8 4.3  CL 99 102 101 101 104  CO2 18* 16* 18* 15* 14*  GLUCOSE 103* 123* 111* 91 101*  BUN 162* 174* 166* 166* 176*  CREATININE 2.25* 2.31* 2.39* 2.31* 2.42*  CALCIUM 8.7* 8.6* 8.2* 8.5* 8.9  PHOS  --  6.7*  --   --   --    Liver Function Tests: Recent Labs  Lab 04/27/20 0415 04/28/20 0545 04/29/20 0520 04/30/20 0951 05/01/20 0409 05/02/20 0545  AST 92* 88*  --  62* 60* 64*  ALT 36 33  --  26 24 25   ALKPHOS 96 77  --  50 51 48  BILITOT 7.6* 7.2*  --  9.5* 12.6* 13.7*  PROT  6.7 7.2  --  6.6 7.4 7.8  ALBUMIN 3.3* 4.0 4.0 4.0 4.7 5.4*   No results for input(s): LIPASE, AMYLASE in the last 168 hours. Recent Labs  Lab 04/26/20 2054  AMMONIA 44*   CBC: Recent Labs  Lab 04/27/20 0415 04/28/20 0545 04/29/20 0530 04/29/20 1504 04/29/20 2224 04/30/20 0951 05/02/20 0545  WBC 15.8* 15.3* 12.2* 10.1  --  9.8 13.6*  NEUTROABS 13.5*  --   --  8.2*  --   --   --   HGB 7.4* 7.6* 6.8* 8.0* 9.2* 10.0* 9.6*  HCT 20.8* 20.5* 18.7* 22.9* 26.6* 27.6* 27.5*  MCV 105.6* 102.5* 103.9* 102.2*  --  96.5 101.1*  PLT 37* 45* 44* 38*  --  40* 44*   Cardiac Enzymes: No results for input(s): CKTOTAL, CKMB, CKMBINDEX, TROPONINI in the last 168 hours. BNP: Invalid input(s): POCBNP CBG: Recent Labs  Lab 04/29/20 0829  GLUCAP 119*   D-Dimer No results for input(s): DDIMER in the last 72 hours. Hgb A1c No results for input(s): HGBA1C in the last 72 hours. Lipid Profile No results for input(s): CHOL, HDL, LDLCALC, TRIG, CHOLHDL, LDLDIRECT in the last 72 hours. Thyroid function studies No results for input(s): TSH, T4TOTAL, T3FREE, THYROIDAB in the last 72 hours.  Invalid input(s): FREET3 Anemia work up No results for input(s): VITAMINB12, FOLATE, FERRITIN, TIBC, IRON, RETICCTPCT in the last 72 hours. Urinalysis    Component Value Date/Time   COLORURINE YELLOW (A) 04/26/2020 1601   APPEARANCEUR HAZY (A) 04/26/2020 1601   LABSPEC 1.012 04/26/2020 1601   PHURINE 5.0 04/26/2020 1601   GLUCOSEU NEGATIVE 04/26/2020 1601   HGBUR NEGATIVE 04/26/2020 1601   BILIRUBINUR NEGATIVE 04/26/2020 1601   KETONESUR NEGATIVE 04/26/2020 1601   PROTEINUR NEGATIVE 04/26/2020 1601   NITRITE NEGATIVE 04/26/2020 1601   LEUKOCYTESUR MODERATE (A) 04/26/2020 1601   Sepsis Labs Invalid input(s): PROCALCITONIN,  WBC,  LACTICIDVEN Microbiology Recent Results (from the past 240 hour(s))  Urine Culture     Status: Abnormal   Collection Time: 04/26/20  4:01 PM   Specimen: Urine, Random   Result Value Ref Range Status   Specimen Description   Final    URINE, RANDOM Performed at [facility], [address], [place], [place] [address]    Special Requests   Final    NONE Performed at [facility], [address] [place], [place] [address]    Culture MULTIPLE SPECIES PRESENT, SUGGEST RECOLLECTION (A)  Final   Report Status 04/29/2020 FINAL  Final  Resp Panel by RT-PCR (Flu A&B, Covid) Nasopharyngeal Swab     Status: None   Collection Time: 04/26/20  8:54 PM   Specimen: Nasopharyngeal Swab; Nasopharyngeal(NP) swabs in vial transport medium  Result Value Ref Range Status   SARS Coronavirus 2 by RT PCR NEGATIVE NEGATIVE Final    Comment: (NOTE) SARS-CoV-2 target nucleic acids are NOT DETECTED.  The SARS-CoV-2 RNA is generally detectable in upper respiratory specimens during the acute phase of infection. The lowest concentration of SARS-CoV-2 viral copies this assay can detect is 138 copies/mL. A negative result does not preclude SARS-Cov-2 infection and should not be used as the sole basis for treatment or other patient management decisions. A negative result may occur with  improper specimen collection/handling, submission of specimen other than nasopharyngeal swab, presence of viral mutation(s) within the areas targeted by this assay, and inadequate number of viral copies(<138 copies/mL). A negative result must be combined with clinical observations, patient history, and epidemiological information. The expected result is Negative.  Fact Sheet for Patients:  EntrepreneurPulse.com.au  Fact Sheet for Healthcare Providers:  IncredibleEmployment.be  This test is no t yet approved or cleared by the [place] FDA and  has been authorized for detection and/or diagnosis of SARS-CoV-2 by FDA under an Emergency Use Authorization (EUA). This EUA will remain  in effect (meaning this test can be used) for the  duration of the COVID-19 declaration under Section 564(b)(1) of the Act, 21 U.S.C.section 360bbb-3(b)(1), unless the authorization is terminated  or revoked sooner.       Influenza A by PCR NEGATIVE NEGATIVE Final   Influenza B by PCR NEGATIVE NEGATIVE Final    Comment: (NOTE) The Xpert Xpress SARS-CoV-2/FLU/RSV plus assay is intended as an aid in the diagnosis of influenza from Nasopharyngeal swab specimens and should not be used as a sole basis for treatment. Nasal washings and aspirates are unacceptable for Xpert Xpress SARS-CoV-2/FLU/RSV testing.  Fact Sheet for Patients: EntrepreneurPulse.com.au  Fact Sheet for Healthcare Providers: IncredibleEmployment.be  This test is not yet approved or cleared by the [place] FDA and has been authorized for detection and/or diagnosis of SARS-CoV-2 by FDA under an Emergency Use Authorization (EUA). This EUA will remain in effect (meaning this test can be used) for the duration of the COVID-19 declaration under Section 564(b)(1) of the Act, 21 U.S.C. section 360bbb-3(b)(1), unless the authorization is terminated or revoked.  Performed at [facility], [address] [place], [place] [address]   CULTURE, BLOOD (ROUTINE X 2) w Reflex to ID Panel     Status: None   Collection Time: 04/27/20  1:10 PM   Specimen: BLOOD  Result Value Ref Range Status   Specimen Description BLOOD LEFT ANTECUBITAL  Final   Special Requests   Final    BOTTLES DRAWN AEROBIC AND ANAEROBIC Blood Culture adequate volume   Culture   Final    NO GROWTH 5 DAYS Performed at [facility], [address] [place], [place] [address]    Report Status 05/02/2020 FINAL  Final  CULTURE, BLOOD (ROUTINE X 2) w Reflex to ID Panel     Status: None   Collection Time: 04/27/20  1:11 PM   Specimen: BLOOD  Result Value Ref Range Status   Specimen Description BLOOD RIGHT ANTECUBITAL  Final   Special Requests  Final     BOTTLES DRAWN AEROBIC AND ANAEROBIC Blood Culture results may not be optimal due to an excessive volume of blood received in culture bottles   Culture   Final    NO GROWTH 5 DAYS Performed at [facility], [address], [place], [place] [address]    Report Status 05/02/2020 FINAL  Final    Time coordinating discharge: Over 30 minutes  SIGNED:  Lorella Nimrod, MD  [facility] 05/02/2020, 2:07 PM  If 7PM-7AM, please contact night-coverage [email or web address]  This record has been created using Systems analyst. Errors have been sought and corrected,but may not always be located. Such creation errors do not reflect on the standard of care.

## 2020-05-02 NOTE — Procedures (Addendum)
PROCEDURE SUMMARY:  Successful US guided left thoracentesis. Yielded 1 L of blood-tinged fluid. Pt tolerated procedure well. No immediate complications.  Specimen sent for labs. CXR ordered; no post-procedure pneumothorax identified. EBL < 2 mL  Theresa Duty, NP 05/02/2020 1:11 PM

## 2020-05-02 NOTE — Progress Notes (Signed)
Report given via phone to Sharlyne Pacas at hospice house.

## 2020-05-03 LAB — PATHOLOGIST SMEAR REVIEW

## 2020-05-03 LAB — PROTEIN, BODY FLUID (OTHER): Total Protein, Body Fluid Other: 2 g/dL

## 2020-05-05 LAB — BODY FLUID CULTURE W GRAM STAIN: Culture: NO GROWTH

## 2020-05-26 ENCOUNTER — Other Ambulatory Visit: Payer: Medicare Other | Admitting: Adult Health Nurse Practitioner

## 2020-05-30 MED ORDER — MIRTAZAPINE 15 MG TABLET
ORAL_TABLET | 1 refills | 0 days
Start: 2020-05-30 — End: ?

## 2020-06-03 DEATH — deceased

## 2020-07-18 ENCOUNTER — Ambulatory Visit: Payer: Medicare Other | Admitting: Gastroenterology

## 2021-06-20 IMAGING — US US ABDOMEN LIMITED
1 series · 3 of 3 positions shown · non-contrast
Comparison: CT 09/14/2014.

CLINICAL DATA: Ascites.

EXAM:
ULTRASOUND ABDOMEN LIMITED

[Series 1: us abdomen limited · 0.26mm/px · 3 of 3 slices shown]
[im 1/3]
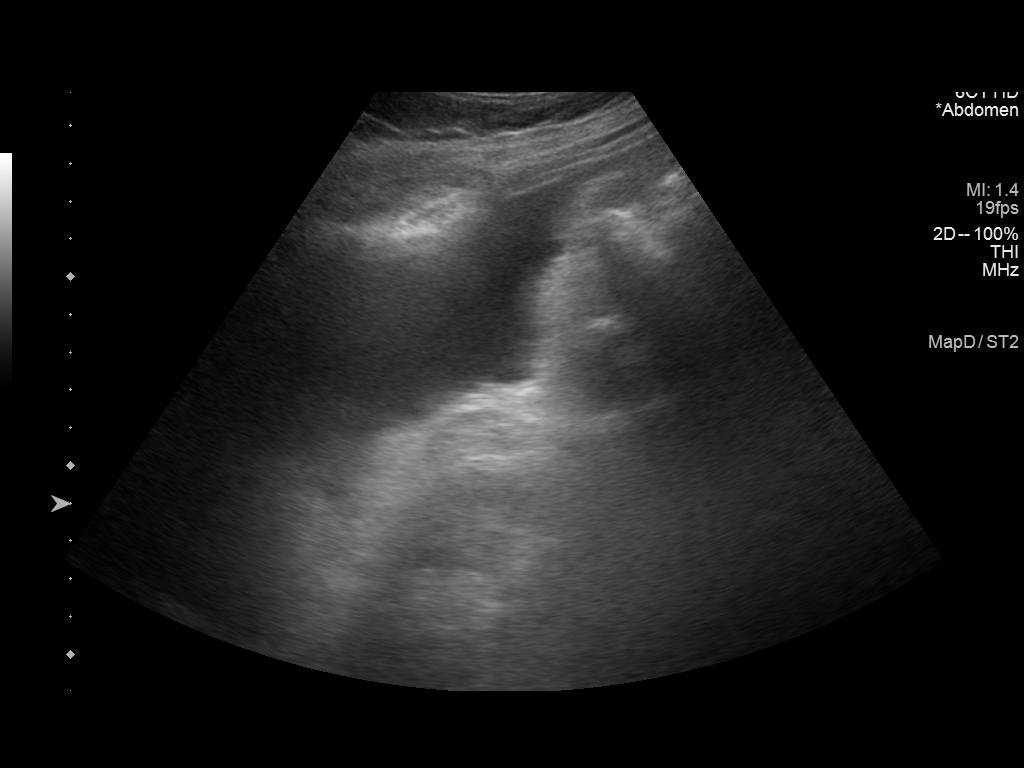
[im 2/3]
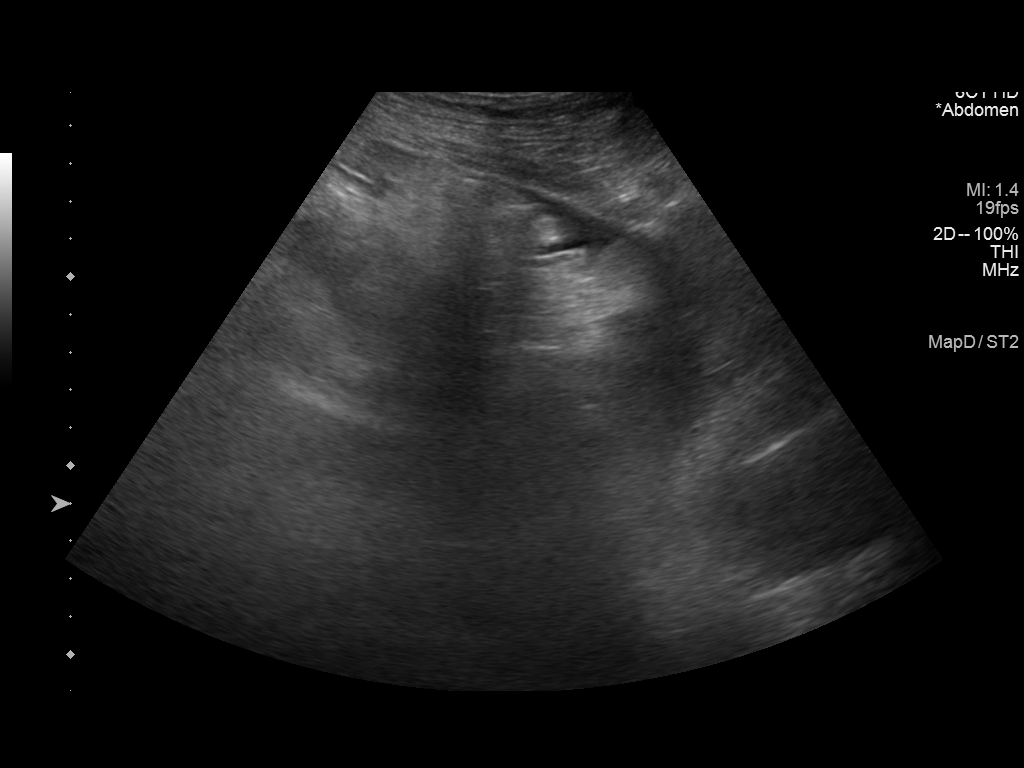
[im 3/3]
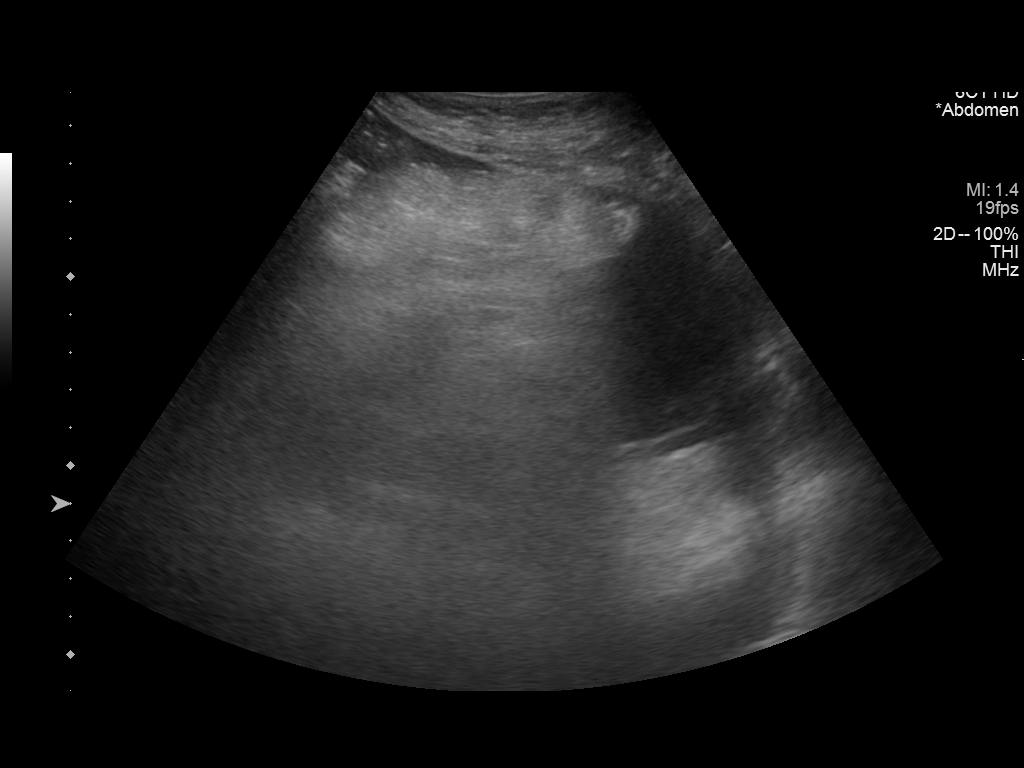

[3 of 3 positions shown; findings below may reference images not displayed]

FINDINGS: Mild ascites noted. No large pocket of fluid was noted for safe
paracentesis. Paracentesis not performed. Follow-up exam can be
obtained as needed.
IMPRESSION: Mild ascites. No large pocket of fluid was noted for safe
paracentesis. Paracentesis not performed.

## 2021-09-02 IMAGING — CR DG SHOULDER 2+V*L*
3 series · 3 of 3 positions shown · non-contrast
Comparison: None.

CLINICAL DATA: Shoulder pain, no known injury, initial encounter

EXAM:
LEFT SHOULDER - 2+ VIEW

[shoulder grashey (1 of 2)]
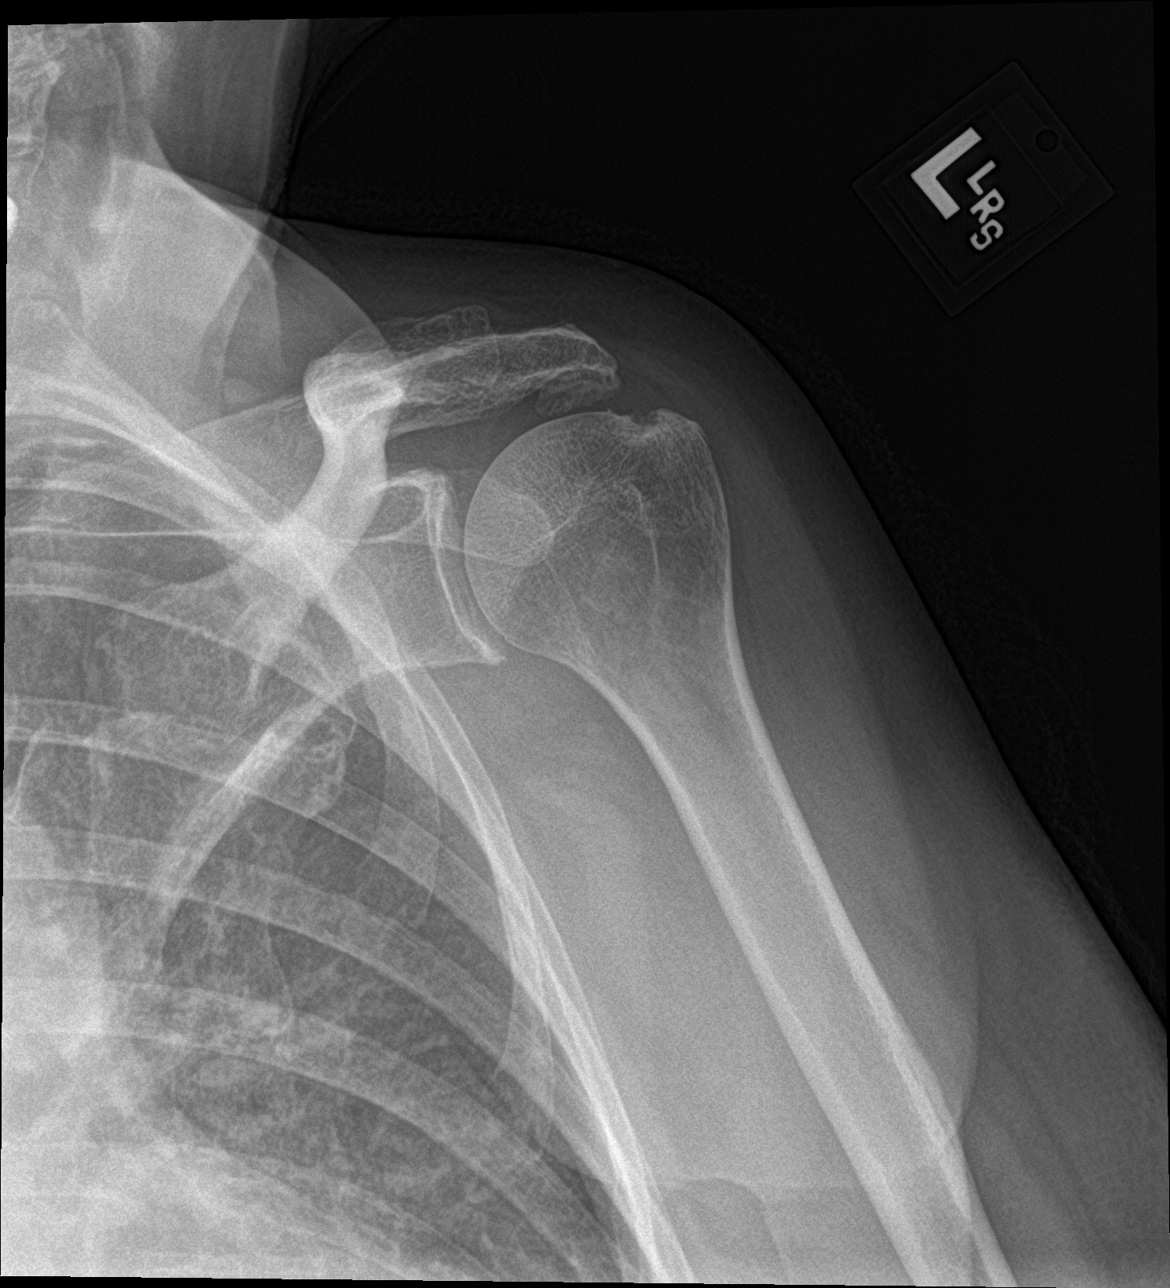

[shoulder y view]
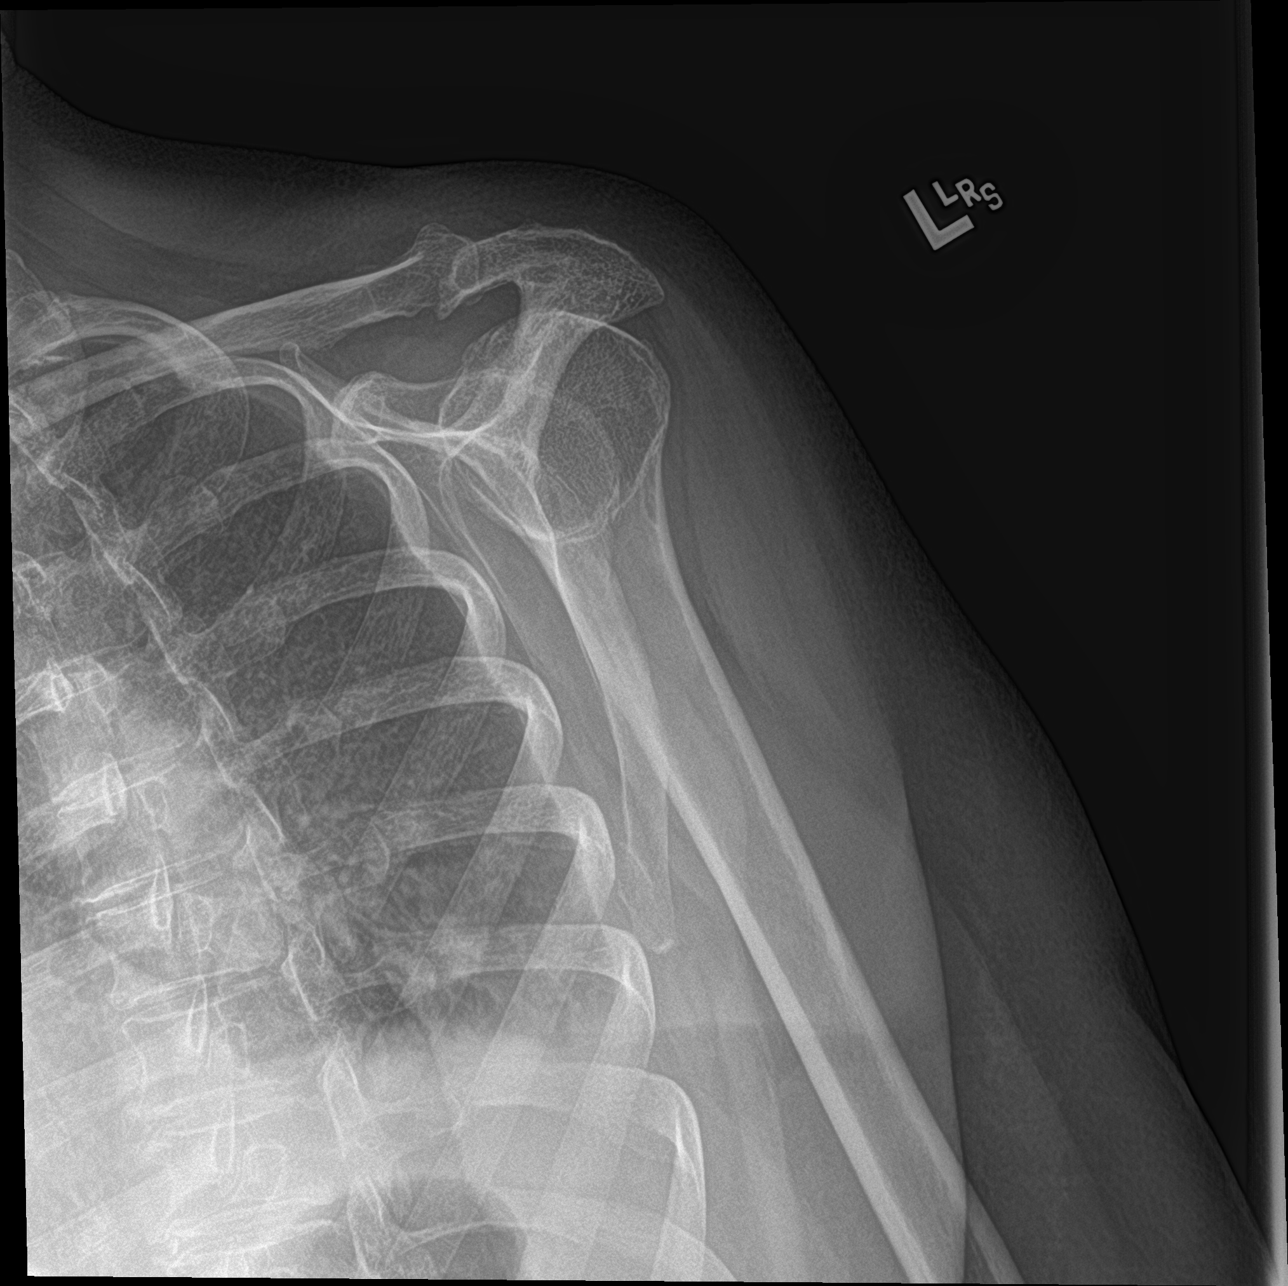

[shoulder grashey (2 of 2)]
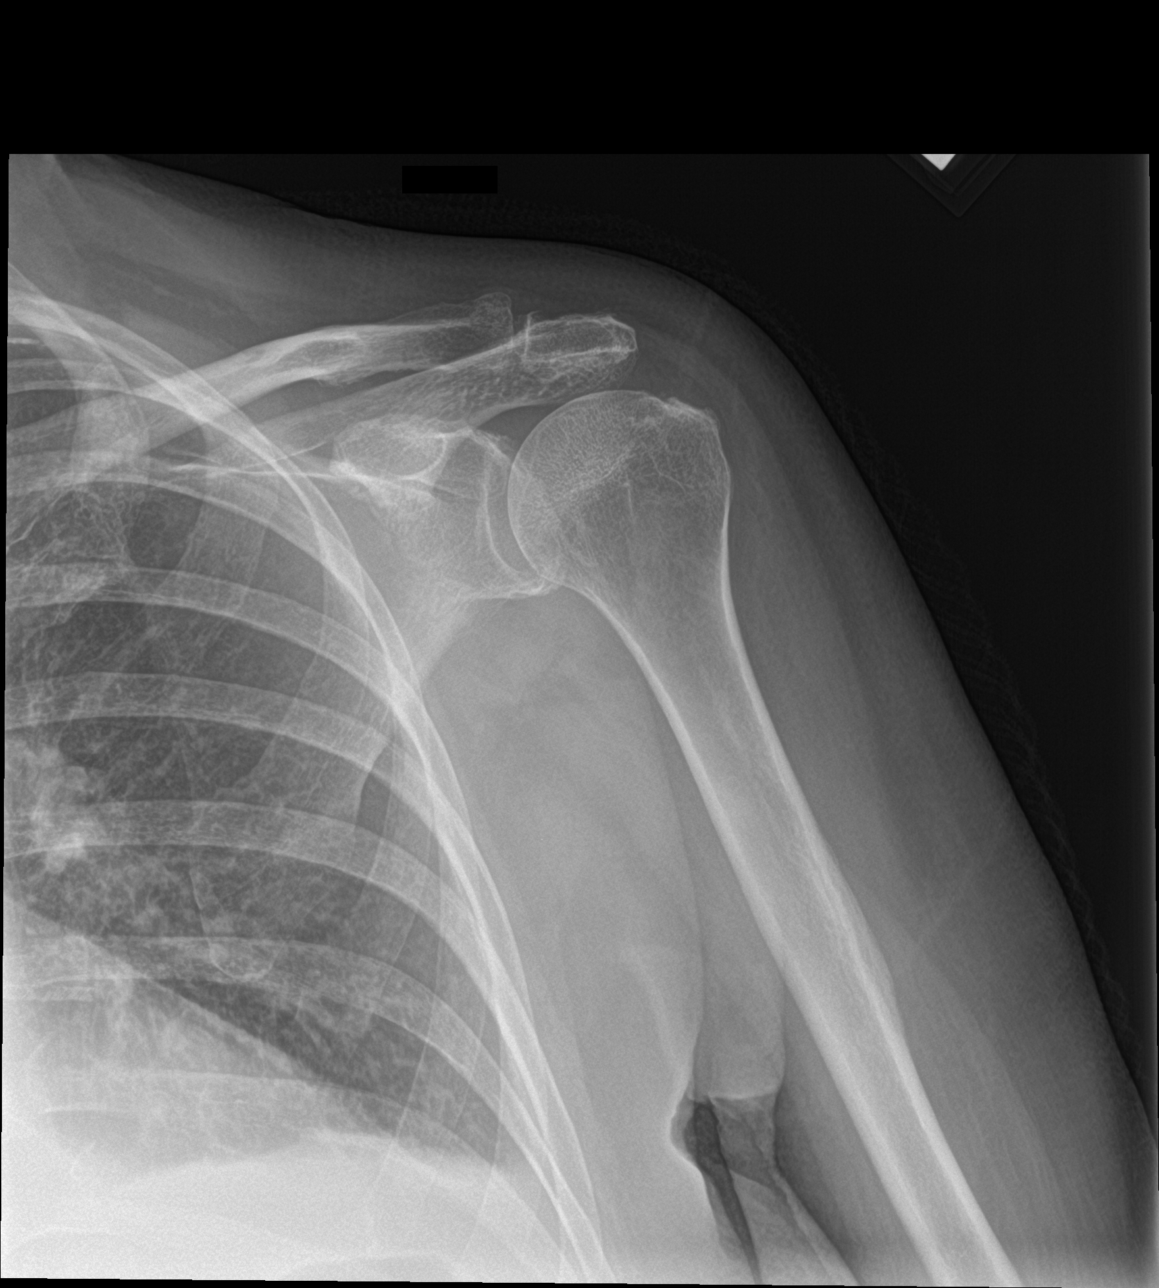

[3 of 3 positions shown; findings below may reference images not displayed]

FINDINGS: Mild degenerative changes of the glenohumeral articulation are seen.
Defect is noted along the superior humerus which may be related to
prior dislocation and Hill-Sachs deformity. Mild degenerative
changes of the acromioclavicular joint are seen. Underlying bony
thorax appears within normal limits. No soft tissue abnormality is
seen.
IMPRESSION: Degenerative changes.

Findings suspicious for prior history of dislocation.
